# Patient Record
Sex: Female | Born: 1935 | Race: White
Health system: Southern US, Community
[De-identification: ages and names within clinical notes are randomized; demographics above are authoritative.]

## PROBLEM LIST (undated history)

## (undated) DIAGNOSIS — Z923 Personal history of irradiation: Secondary | ICD-10-CM

## (undated) DIAGNOSIS — T7840XA Allergy, unspecified, initial encounter: Secondary | ICD-10-CM

## (undated) DIAGNOSIS — R32 Unspecified urinary incontinence: Secondary | ICD-10-CM

## (undated) DIAGNOSIS — C801 Malignant (primary) neoplasm, unspecified: Secondary | ICD-10-CM

## (undated) DIAGNOSIS — R0600 Dyspnea, unspecified: Secondary | ICD-10-CM

## (undated) DIAGNOSIS — G629 Polyneuropathy, unspecified: Secondary | ICD-10-CM

## (undated) DIAGNOSIS — C50919 Malignant neoplasm of unspecified site of unspecified female breast: Secondary | ICD-10-CM

## (undated) DIAGNOSIS — H269 Unspecified cataract: Secondary | ICD-10-CM

## (undated) DIAGNOSIS — I10 Essential (primary) hypertension: Secondary | ICD-10-CM

## (undated) DIAGNOSIS — G4730 Sleep apnea, unspecified: Secondary | ICD-10-CM

## (undated) DIAGNOSIS — F419 Anxiety disorder, unspecified: Secondary | ICD-10-CM

## (undated) DIAGNOSIS — K635 Polyp of colon: Secondary | ICD-10-CM

## (undated) DIAGNOSIS — K219 Gastro-esophageal reflux disease without esophagitis: Secondary | ICD-10-CM

## (undated) DIAGNOSIS — M1990 Unspecified osteoarthritis, unspecified site: Secondary | ICD-10-CM

## (undated) DIAGNOSIS — R600 Localized edema: Secondary | ICD-10-CM

## (undated) HISTORY — DX: Unspecified osteoarthritis, unspecified site: M19.90

## (undated) HISTORY — DX: Malignant neoplasm of unspecified site of unspecified female breast: C50.919

## (undated) HISTORY — PX: ABDOMINAL HYSTERECTOMY: SHX81

## (undated) HISTORY — DX: Unspecified cataract: H26.9

## (undated) HISTORY — PX: TONSILLECTOMY: SUR1361

## (undated) HISTORY — DX: Essential (primary) hypertension: I10

## (undated) HISTORY — DX: Malignant (primary) neoplasm, unspecified: C80.1

## (undated) HISTORY — DX: Polyp of colon: K63.5

## (undated) HISTORY — DX: Gastro-esophageal reflux disease without esophagitis: K21.9

## (undated) HISTORY — PX: CARPAL TUNNEL RELEASE: SHX101

## (undated) HISTORY — DX: Allergy, unspecified, initial encounter: T78.40XA

---

## 1991-04-04 DIAGNOSIS — M1990 Unspecified osteoarthritis, unspecified site: Secondary | ICD-10-CM

## 1991-04-04 HISTORY — DX: Unspecified osteoarthritis, unspecified site: M19.90

## 2003-04-04 DIAGNOSIS — K635 Polyp of colon: Secondary | ICD-10-CM

## 2003-04-04 HISTORY — PX: EYE SURGERY: SHX253

## 2003-04-04 HISTORY — DX: Polyp of colon: K63.5

## 2003-05-05 HISTORY — PX: COLONOSCOPY: SHX174

## 2004-03-31 ENCOUNTER — Ambulatory Visit: Payer: Self-pay | Admitting: Specialist

## 2004-03-31 ENCOUNTER — Other Ambulatory Visit: Payer: Self-pay

## 2004-04-07 ENCOUNTER — Ambulatory Visit: Payer: Self-pay | Admitting: Specialist

## 2004-05-19 ENCOUNTER — Ambulatory Visit: Payer: Self-pay | Admitting: Specialist

## 2004-11-03 ENCOUNTER — Ambulatory Visit: Payer: Self-pay | Admitting: Family Medicine

## 2005-04-03 HISTORY — PX: JOINT REPLACEMENT: SHX530

## 2005-07-19 ENCOUNTER — Inpatient Hospital Stay (HOSPITAL_COMMUNITY): Admission: RE | Admit: 2005-07-19 | Discharge: 2005-07-24 | Payer: Self-pay | Admitting: Orthopedic Surgery

## 2005-07-20 ENCOUNTER — Ambulatory Visit: Payer: Self-pay | Admitting: Physical Medicine & Rehabilitation

## 2005-10-18 ENCOUNTER — Inpatient Hospital Stay (HOSPITAL_COMMUNITY): Admission: RE | Admit: 2005-10-18 | Discharge: 2005-10-23 | Payer: Self-pay | Admitting: Orthopedic Surgery

## 2005-12-21 ENCOUNTER — Ambulatory Visit: Payer: Self-pay | Admitting: Family Medicine

## 2007-01-31 ENCOUNTER — Ambulatory Visit: Payer: Self-pay | Admitting: Family Medicine

## 2008-02-04 ENCOUNTER — Ambulatory Visit: Payer: Self-pay | Admitting: Family Medicine

## 2008-06-17 ENCOUNTER — Ambulatory Visit: Payer: Self-pay | Admitting: Family Medicine

## 2008-07-08 ENCOUNTER — Ambulatory Visit: Payer: Self-pay | Admitting: Family Medicine

## 2009-03-24 ENCOUNTER — Ambulatory Visit: Payer: Self-pay | Admitting: Family Medicine

## 2010-04-03 DIAGNOSIS — Z923 Personal history of irradiation: Secondary | ICD-10-CM

## 2010-04-03 DIAGNOSIS — C801 Malignant (primary) neoplasm, unspecified: Secondary | ICD-10-CM

## 2010-04-03 HISTORY — DX: Personal history of irradiation: Z92.3

## 2010-04-03 HISTORY — PX: BREAST LUMPECTOMY: SHX2

## 2010-04-03 HISTORY — DX: Malignant (primary) neoplasm, unspecified: C80.1

## 2010-04-03 HISTORY — PX: BREAST BIOPSY: SHX20

## 2010-06-29 ENCOUNTER — Ambulatory Visit: Payer: Self-pay | Admitting: Otolaryngology

## 2010-07-13 ENCOUNTER — Ambulatory Visit: Payer: Self-pay | Admitting: Family Medicine

## 2010-07-15 ENCOUNTER — Ambulatory Visit: Payer: Self-pay | Admitting: Family Medicine

## 2010-07-29 ENCOUNTER — Ambulatory Visit: Payer: Self-pay | Admitting: General Surgery

## 2010-08-01 ENCOUNTER — Ambulatory Visit: Payer: Self-pay | Admitting: General Surgery

## 2010-08-02 HISTORY — PX: OTHER SURGICAL HISTORY: SHX169

## 2010-08-03 LAB — PATHOLOGY REPORT

## 2010-08-17 ENCOUNTER — Ambulatory Visit: Payer: Self-pay | Admitting: Internal Medicine

## 2010-09-02 ENCOUNTER — Ambulatory Visit: Payer: Self-pay | Admitting: Internal Medicine

## 2010-10-02 ENCOUNTER — Ambulatory Visit: Payer: Self-pay | Admitting: Internal Medicine

## 2010-11-02 ENCOUNTER — Ambulatory Visit: Payer: Self-pay | Admitting: Internal Medicine

## 2010-12-23 ENCOUNTER — Ambulatory Visit: Payer: Self-pay | Admitting: Internal Medicine

## 2011-01-09 ENCOUNTER — Ambulatory Visit: Payer: Self-pay | Admitting: Internal Medicine

## 2011-02-02 ENCOUNTER — Ambulatory Visit: Payer: Self-pay | Admitting: Internal Medicine

## 2011-03-04 ENCOUNTER — Ambulatory Visit: Payer: Self-pay | Admitting: Internal Medicine

## 2011-03-30 ENCOUNTER — Ambulatory Visit: Payer: Self-pay | Admitting: General Surgery

## 2011-04-06 DIAGNOSIS — Z853 Personal history of malignant neoplasm of breast: Secondary | ICD-10-CM | POA: Diagnosis not present

## 2011-05-12 ENCOUNTER — Ambulatory Visit: Payer: Self-pay | Admitting: Internal Medicine

## 2011-05-12 DIAGNOSIS — Z7982 Long term (current) use of aspirin: Secondary | ICD-10-CM | POA: Diagnosis not present

## 2011-05-12 DIAGNOSIS — Z170 Estrogen receptor positive status [ER+]: Secondary | ICD-10-CM | POA: Diagnosis not present

## 2011-05-12 DIAGNOSIS — I10 Essential (primary) hypertension: Secondary | ICD-10-CM | POA: Diagnosis not present

## 2011-05-12 DIAGNOSIS — Z79899 Other long term (current) drug therapy: Secondary | ICD-10-CM | POA: Diagnosis not present

## 2011-05-12 DIAGNOSIS — Z9889 Other specified postprocedural states: Secondary | ICD-10-CM | POA: Diagnosis not present

## 2011-05-12 DIAGNOSIS — Z923 Personal history of irradiation: Secondary | ICD-10-CM | POA: Diagnosis not present

## 2011-05-12 DIAGNOSIS — T8189XA Other complications of procedures, not elsewhere classified, initial encounter: Secondary | ICD-10-CM | POA: Diagnosis not present

## 2011-05-12 DIAGNOSIS — C50919 Malignant neoplasm of unspecified site of unspecified female breast: Secondary | ICD-10-CM | POA: Diagnosis not present

## 2011-05-17 DIAGNOSIS — R21 Rash and other nonspecific skin eruption: Secondary | ICD-10-CM | POA: Diagnosis not present

## 2011-05-17 DIAGNOSIS — L219 Seborrheic dermatitis, unspecified: Secondary | ICD-10-CM | POA: Diagnosis not present

## 2011-05-17 DIAGNOSIS — L819 Disorder of pigmentation, unspecified: Secondary | ICD-10-CM | POA: Diagnosis not present

## 2011-05-24 DIAGNOSIS — Z961 Presence of intraocular lens: Secondary | ICD-10-CM | POA: Diagnosis not present

## 2011-05-24 DIAGNOSIS — H2510 Age-related nuclear cataract, unspecified eye: Secondary | ICD-10-CM | POA: Diagnosis not present

## 2011-06-02 ENCOUNTER — Ambulatory Visit: Payer: Self-pay | Admitting: Internal Medicine

## 2011-06-02 DIAGNOSIS — Z09 Encounter for follow-up examination after completed treatment for conditions other than malignant neoplasm: Secondary | ICD-10-CM | POA: Diagnosis not present

## 2011-06-02 DIAGNOSIS — T8189XA Other complications of procedures, not elsewhere classified, initial encounter: Secondary | ICD-10-CM | POA: Diagnosis not present

## 2011-06-02 DIAGNOSIS — Z7982 Long term (current) use of aspirin: Secondary | ICD-10-CM | POA: Diagnosis not present

## 2011-06-02 DIAGNOSIS — Z170 Estrogen receptor positive status [ER+]: Secondary | ICD-10-CM | POA: Diagnosis not present

## 2011-06-02 DIAGNOSIS — Z79899 Other long term (current) drug therapy: Secondary | ICD-10-CM | POA: Diagnosis not present

## 2011-06-02 DIAGNOSIS — I10 Essential (primary) hypertension: Secondary | ICD-10-CM | POA: Diagnosis not present

## 2011-06-02 DIAGNOSIS — Z923 Personal history of irradiation: Secondary | ICD-10-CM | POA: Diagnosis not present

## 2011-06-02 DIAGNOSIS — Z9889 Other specified postprocedural states: Secondary | ICD-10-CM | POA: Diagnosis not present

## 2011-06-02 DIAGNOSIS — C50919 Malignant neoplasm of unspecified site of unspecified female breast: Secondary | ICD-10-CM | POA: Diagnosis not present

## 2011-06-14 DIAGNOSIS — J329 Chronic sinusitis, unspecified: Secondary | ICD-10-CM | POA: Diagnosis not present

## 2011-06-14 DIAGNOSIS — C50919 Malignant neoplasm of unspecified site of unspecified female breast: Secondary | ICD-10-CM | POA: Diagnosis not present

## 2011-06-14 DIAGNOSIS — R05 Cough: Secondary | ICD-10-CM | POA: Diagnosis not present

## 2011-06-14 DIAGNOSIS — M129 Arthropathy, unspecified: Secondary | ICD-10-CM | POA: Diagnosis not present

## 2011-06-19 DIAGNOSIS — C50919 Malignant neoplasm of unspecified site of unspecified female breast: Secondary | ICD-10-CM | POA: Diagnosis not present

## 2011-06-19 DIAGNOSIS — Z09 Encounter for follow-up examination after completed treatment for conditions other than malignant neoplasm: Secondary | ICD-10-CM | POA: Diagnosis not present

## 2011-06-19 DIAGNOSIS — T8189XA Other complications of procedures, not elsewhere classified, initial encounter: Secondary | ICD-10-CM | POA: Diagnosis not present

## 2011-06-19 DIAGNOSIS — Z170 Estrogen receptor positive status [ER+]: Secondary | ICD-10-CM | POA: Diagnosis not present

## 2011-07-03 ENCOUNTER — Ambulatory Visit: Payer: Self-pay | Admitting: Internal Medicine

## 2011-07-11 DIAGNOSIS — Z1331 Encounter for screening for depression: Secondary | ICD-10-CM | POA: Diagnosis not present

## 2011-07-11 DIAGNOSIS — Z23 Encounter for immunization: Secondary | ICD-10-CM | POA: Diagnosis not present

## 2011-07-11 DIAGNOSIS — M129 Arthropathy, unspecified: Secondary | ICD-10-CM | POA: Diagnosis not present

## 2011-07-11 DIAGNOSIS — Z0000 Encounter for general adult medical examination without abnormal findings: Secondary | ICD-10-CM | POA: Diagnosis not present

## 2011-07-11 DIAGNOSIS — R05 Cough: Secondary | ICD-10-CM | POA: Diagnosis not present

## 2011-07-11 DIAGNOSIS — C50919 Malignant neoplasm of unspecified site of unspecified female breast: Secondary | ICD-10-CM | POA: Diagnosis not present

## 2011-07-20 DIAGNOSIS — D485 Neoplasm of uncertain behavior of skin: Secondary | ICD-10-CM | POA: Diagnosis not present

## 2011-07-20 DIAGNOSIS — N951 Menopausal and female climacteric states: Secondary | ICD-10-CM | POA: Diagnosis not present

## 2011-07-20 DIAGNOSIS — L570 Actinic keratosis: Secondary | ICD-10-CM | POA: Diagnosis not present

## 2011-07-20 DIAGNOSIS — L719 Rosacea, unspecified: Secondary | ICD-10-CM | POA: Diagnosis not present

## 2011-07-20 DIAGNOSIS — E2839 Other primary ovarian failure: Secondary | ICD-10-CM | POA: Diagnosis not present

## 2011-07-20 DIAGNOSIS — M899 Disorder of bone, unspecified: Secondary | ICD-10-CM | POA: Diagnosis not present

## 2011-07-20 DIAGNOSIS — M949 Disorder of cartilage, unspecified: Secondary | ICD-10-CM | POA: Diagnosis not present

## 2011-08-24 DIAGNOSIS — L589 Radiodermatitis, unspecified: Secondary | ICD-10-CM | POA: Diagnosis not present

## 2011-08-24 DIAGNOSIS — L570 Actinic keratosis: Secondary | ICD-10-CM | POA: Diagnosis not present

## 2011-10-10 ENCOUNTER — Ambulatory Visit: Payer: Self-pay | Admitting: General Surgery

## 2011-10-10 DIAGNOSIS — R928 Other abnormal and inconclusive findings on diagnostic imaging of breast: Secondary | ICD-10-CM | POA: Diagnosis not present

## 2011-10-10 DIAGNOSIS — Z853 Personal history of malignant neoplasm of breast: Secondary | ICD-10-CM | POA: Diagnosis not present

## 2011-10-19 ENCOUNTER — Ambulatory Visit: Payer: Self-pay | Admitting: Internal Medicine

## 2011-10-19 DIAGNOSIS — C50919 Malignant neoplasm of unspecified site of unspecified female breast: Secondary | ICD-10-CM | POA: Diagnosis not present

## 2011-10-19 DIAGNOSIS — G4730 Sleep apnea, unspecified: Secondary | ICD-10-CM | POA: Diagnosis not present

## 2011-10-19 DIAGNOSIS — Z79811 Long term (current) use of aromatase inhibitors: Secondary | ICD-10-CM | POA: Diagnosis not present

## 2011-10-19 DIAGNOSIS — Z79899 Other long term (current) drug therapy: Secondary | ICD-10-CM | POA: Diagnosis not present

## 2011-10-19 DIAGNOSIS — I10 Essential (primary) hypertension: Secondary | ICD-10-CM | POA: Diagnosis not present

## 2011-10-19 DIAGNOSIS — Z170 Estrogen receptor positive status [ER+]: Secondary | ICD-10-CM | POA: Diagnosis not present

## 2011-10-26 DIAGNOSIS — Z853 Personal history of malignant neoplasm of breast: Secondary | ICD-10-CM | POA: Diagnosis not present

## 2011-11-02 ENCOUNTER — Ambulatory Visit: Payer: Self-pay | Admitting: Internal Medicine

## 2011-11-02 DIAGNOSIS — C50919 Malignant neoplasm of unspecified site of unspecified female breast: Secondary | ICD-10-CM | POA: Diagnosis not present

## 2011-11-02 DIAGNOSIS — Z923 Personal history of irradiation: Secondary | ICD-10-CM | POA: Diagnosis not present

## 2011-11-02 DIAGNOSIS — Z79899 Other long term (current) drug therapy: Secondary | ICD-10-CM | POA: Diagnosis not present

## 2011-11-02 DIAGNOSIS — Z79811 Long term (current) use of aromatase inhibitors: Secondary | ICD-10-CM | POA: Diagnosis not present

## 2011-11-02 DIAGNOSIS — Z170 Estrogen receptor positive status [ER+]: Secondary | ICD-10-CM | POA: Diagnosis not present

## 2011-11-02 DIAGNOSIS — Z09 Encounter for follow-up examination after completed treatment for conditions other than malignant neoplasm: Secondary | ICD-10-CM | POA: Diagnosis not present

## 2011-11-02 DIAGNOSIS — G4730 Sleep apnea, unspecified: Secondary | ICD-10-CM | POA: Diagnosis not present

## 2011-11-02 DIAGNOSIS — I10 Essential (primary) hypertension: Secondary | ICD-10-CM | POA: Diagnosis not present

## 2011-11-14 DIAGNOSIS — Z79811 Long term (current) use of aromatase inhibitors: Secondary | ICD-10-CM | POA: Diagnosis not present

## 2011-11-14 DIAGNOSIS — Z09 Encounter for follow-up examination after completed treatment for conditions other than malignant neoplasm: Secondary | ICD-10-CM | POA: Diagnosis not present

## 2011-11-14 DIAGNOSIS — C50919 Malignant neoplasm of unspecified site of unspecified female breast: Secondary | ICD-10-CM | POA: Diagnosis not present

## 2011-11-14 DIAGNOSIS — Z170 Estrogen receptor positive status [ER+]: Secondary | ICD-10-CM | POA: Diagnosis not present

## 2011-12-03 ENCOUNTER — Ambulatory Visit: Payer: Self-pay | Admitting: Internal Medicine

## 2011-12-03 DIAGNOSIS — I10 Essential (primary) hypertension: Secondary | ICD-10-CM | POA: Diagnosis not present

## 2011-12-03 DIAGNOSIS — Z09 Encounter for follow-up examination after completed treatment for conditions other than malignant neoplasm: Secondary | ICD-10-CM | POA: Diagnosis not present

## 2011-12-03 DIAGNOSIS — Z170 Estrogen receptor positive status [ER+]: Secondary | ICD-10-CM | POA: Diagnosis not present

## 2011-12-03 DIAGNOSIS — Z23 Encounter for immunization: Secondary | ICD-10-CM | POA: Diagnosis not present

## 2011-12-03 DIAGNOSIS — Z79811 Long term (current) use of aromatase inhibitors: Secondary | ICD-10-CM | POA: Diagnosis not present

## 2011-12-03 DIAGNOSIS — Z79899 Other long term (current) drug therapy: Secondary | ICD-10-CM | POA: Diagnosis not present

## 2011-12-03 DIAGNOSIS — G4730 Sleep apnea, unspecified: Secondary | ICD-10-CM | POA: Diagnosis not present

## 2011-12-03 DIAGNOSIS — C50919 Malignant neoplasm of unspecified site of unspecified female breast: Secondary | ICD-10-CM | POA: Diagnosis not present

## 2011-12-03 DIAGNOSIS — Z923 Personal history of irradiation: Secondary | ICD-10-CM | POA: Diagnosis not present

## 2012-01-02 ENCOUNTER — Ambulatory Visit: Payer: Self-pay | Admitting: Internal Medicine

## 2012-02-28 ENCOUNTER — Ambulatory Visit: Payer: Self-pay | Admitting: Internal Medicine

## 2012-02-28 DIAGNOSIS — Z90710 Acquired absence of both cervix and uterus: Secondary | ICD-10-CM | POA: Diagnosis not present

## 2012-02-28 DIAGNOSIS — I10 Essential (primary) hypertension: Secondary | ICD-10-CM | POA: Diagnosis not present

## 2012-02-28 DIAGNOSIS — Z170 Estrogen receptor positive status [ER+]: Secondary | ICD-10-CM | POA: Diagnosis not present

## 2012-02-28 DIAGNOSIS — C50919 Malignant neoplasm of unspecified site of unspecified female breast: Secondary | ICD-10-CM | POA: Diagnosis not present

## 2012-02-28 DIAGNOSIS — G4730 Sleep apnea, unspecified: Secondary | ICD-10-CM | POA: Diagnosis not present

## 2012-02-28 DIAGNOSIS — Z79811 Long term (current) use of aromatase inhibitors: Secondary | ICD-10-CM | POA: Diagnosis not present

## 2012-02-28 DIAGNOSIS — Z79899 Other long term (current) drug therapy: Secondary | ICD-10-CM | POA: Diagnosis not present

## 2012-02-28 DIAGNOSIS — Z9089 Acquired absence of other organs: Secondary | ICD-10-CM | POA: Diagnosis not present

## 2012-02-28 DIAGNOSIS — L408 Other psoriasis: Secondary | ICD-10-CM | POA: Diagnosis not present

## 2012-02-28 DIAGNOSIS — Z96659 Presence of unspecified artificial knee joint: Secondary | ICD-10-CM | POA: Diagnosis not present

## 2012-02-28 DIAGNOSIS — M129 Arthropathy, unspecified: Secondary | ICD-10-CM | POA: Diagnosis not present

## 2012-02-28 DIAGNOSIS — Z800 Family history of malignant neoplasm of digestive organs: Secondary | ICD-10-CM | POA: Diagnosis not present

## 2012-03-03 ENCOUNTER — Ambulatory Visit: Payer: Self-pay | Admitting: Internal Medicine

## 2012-03-07 DIAGNOSIS — R5383 Other fatigue: Secondary | ICD-10-CM | POA: Diagnosis not present

## 2012-03-07 DIAGNOSIS — M1990 Unspecified osteoarthritis, unspecified site: Secondary | ICD-10-CM | POA: Diagnosis not present

## 2012-03-07 DIAGNOSIS — I10 Essential (primary) hypertension: Secondary | ICD-10-CM | POA: Diagnosis not present

## 2012-03-07 DIAGNOSIS — J309 Allergic rhinitis, unspecified: Secondary | ICD-10-CM | POA: Diagnosis not present

## 2012-03-19 DIAGNOSIS — R5381 Other malaise: Secondary | ICD-10-CM | POA: Diagnosis not present

## 2012-03-19 DIAGNOSIS — I10 Essential (primary) hypertension: Secondary | ICD-10-CM | POA: Diagnosis not present

## 2012-03-19 DIAGNOSIS — G2581 Restless legs syndrome: Secondary | ICD-10-CM | POA: Diagnosis not present

## 2012-03-19 DIAGNOSIS — Z79899 Other long term (current) drug therapy: Secondary | ICD-10-CM | POA: Diagnosis not present

## 2012-04-11 ENCOUNTER — Ambulatory Visit: Payer: Self-pay | Admitting: General Surgery

## 2012-04-11 DIAGNOSIS — Z1231 Encounter for screening mammogram for malignant neoplasm of breast: Secondary | ICD-10-CM | POA: Diagnosis not present

## 2012-04-11 DIAGNOSIS — R928 Other abnormal and inconclusive findings on diagnostic imaging of breast: Secondary | ICD-10-CM | POA: Diagnosis not present

## 2012-04-11 DIAGNOSIS — Z853 Personal history of malignant neoplasm of breast: Secondary | ICD-10-CM | POA: Diagnosis not present

## 2012-04-25 DIAGNOSIS — M25549 Pain in joints of unspecified hand: Secondary | ICD-10-CM | POA: Diagnosis not present

## 2012-04-25 DIAGNOSIS — M659 Synovitis and tenosynovitis, unspecified: Secondary | ICD-10-CM | POA: Diagnosis not present

## 2012-04-25 DIAGNOSIS — M25519 Pain in unspecified shoulder: Secondary | ICD-10-CM | POA: Diagnosis not present

## 2012-04-25 DIAGNOSIS — M65849 Other synovitis and tenosynovitis, unspecified hand: Secondary | ICD-10-CM | POA: Diagnosis not present

## 2012-05-14 DIAGNOSIS — J40 Bronchitis, not specified as acute or chronic: Secondary | ICD-10-CM | POA: Diagnosis not present

## 2012-05-14 DIAGNOSIS — M159 Polyosteoarthritis, unspecified: Secondary | ICD-10-CM | POA: Diagnosis not present

## 2012-05-14 DIAGNOSIS — M67919 Unspecified disorder of synovium and tendon, unspecified shoulder: Secondary | ICD-10-CM | POA: Diagnosis not present

## 2012-06-26 ENCOUNTER — Ambulatory Visit: Payer: Self-pay | Admitting: Internal Medicine

## 2012-07-22 ENCOUNTER — Other Ambulatory Visit: Payer: Self-pay | Admitting: General Surgery

## 2012-07-22 DIAGNOSIS — Z1211 Encounter for screening for malignant neoplasm of colon: Secondary | ICD-10-CM

## 2012-07-23 ENCOUNTER — Encounter: Payer: Self-pay | Admitting: *Deleted

## 2012-07-24 ENCOUNTER — Ambulatory Visit: Payer: Self-pay | Admitting: General Surgery

## 2012-07-24 ENCOUNTER — Encounter: Payer: Self-pay | Admitting: General Surgery

## 2012-07-24 DIAGNOSIS — G4730 Sleep apnea, unspecified: Secondary | ICD-10-CM | POA: Diagnosis not present

## 2012-07-24 DIAGNOSIS — Z1211 Encounter for screening for malignant neoplasm of colon: Secondary | ICD-10-CM | POA: Diagnosis not present

## 2012-07-24 DIAGNOSIS — Z853 Personal history of malignant neoplasm of breast: Secondary | ICD-10-CM | POA: Diagnosis not present

## 2012-07-24 DIAGNOSIS — R0989 Other specified symptoms and signs involving the circulatory and respiratory systems: Secondary | ICD-10-CM | POA: Diagnosis not present

## 2012-07-24 DIAGNOSIS — E669 Obesity, unspecified: Secondary | ICD-10-CM | POA: Diagnosis not present

## 2012-07-24 DIAGNOSIS — I10 Essential (primary) hypertension: Secondary | ICD-10-CM | POA: Diagnosis not present

## 2012-07-24 DIAGNOSIS — Z86010 Personal history of colonic polyps: Secondary | ICD-10-CM | POA: Diagnosis not present

## 2012-07-24 DIAGNOSIS — Z79899 Other long term (current) drug therapy: Secondary | ICD-10-CM | POA: Diagnosis not present

## 2012-07-24 DIAGNOSIS — R062 Wheezing: Secondary | ICD-10-CM | POA: Diagnosis not present

## 2012-07-24 DIAGNOSIS — Z6841 Body Mass Index (BMI) 40.0 and over, adult: Secondary | ICD-10-CM | POA: Diagnosis not present

## 2012-07-24 DIAGNOSIS — D126 Benign neoplasm of colon, unspecified: Secondary | ICD-10-CM | POA: Diagnosis not present

## 2012-07-24 DIAGNOSIS — K5730 Diverticulosis of large intestine without perforation or abscess without bleeding: Secondary | ICD-10-CM | POA: Diagnosis not present

## 2012-07-24 DIAGNOSIS — Z800 Family history of malignant neoplasm of digestive organs: Secondary | ICD-10-CM | POA: Diagnosis not present

## 2012-07-24 HISTORY — PX: COLONOSCOPY: SHX174

## 2012-07-31 ENCOUNTER — Encounter: Payer: Self-pay | Admitting: General Surgery

## 2012-08-06 ENCOUNTER — Telehealth: Payer: Self-pay | Admitting: *Deleted

## 2012-08-06 NOTE — Telephone Encounter (Signed)
Message copied by Currie Paris on Tue Aug 06, 2012  8:40 AM ------      Message from: Kieth Brightly      Created: Tue Aug 06, 2012  7:32 AM       Please let pt pt know the pathology was normal-colon polyp ------

## 2012-08-06 NOTE — Telephone Encounter (Signed)
Notified patient as instructed, patient pleased. Discussed follow-up appointments, patient agrees  

## 2012-08-14 ENCOUNTER — Encounter: Payer: Self-pay | Admitting: General Surgery

## 2012-08-14 DIAGNOSIS — L570 Actinic keratosis: Secondary | ICD-10-CM | POA: Diagnosis not present

## 2012-08-14 DIAGNOSIS — L280 Lichen simplex chronicus: Secondary | ICD-10-CM | POA: Diagnosis not present

## 2012-08-14 DIAGNOSIS — L259 Unspecified contact dermatitis, unspecified cause: Secondary | ICD-10-CM | POA: Diagnosis not present

## 2012-08-21 DIAGNOSIS — H2510 Age-related nuclear cataract, unspecified eye: Secondary | ICD-10-CM | POA: Diagnosis not present

## 2012-08-21 DIAGNOSIS — Z961 Presence of intraocular lens: Secondary | ICD-10-CM | POA: Diagnosis not present

## 2012-09-05 DIAGNOSIS — R072 Precordial pain: Secondary | ICD-10-CM | POA: Diagnosis not present

## 2012-09-05 DIAGNOSIS — C50919 Malignant neoplasm of unspecified site of unspecified female breast: Secondary | ICD-10-CM | POA: Diagnosis not present

## 2012-09-05 DIAGNOSIS — I10 Essential (primary) hypertension: Secondary | ICD-10-CM | POA: Diagnosis not present

## 2012-09-05 DIAGNOSIS — M1990 Unspecified osteoarthritis, unspecified site: Secondary | ICD-10-CM | POA: Diagnosis not present

## 2012-10-10 ENCOUNTER — Ambulatory Visit: Payer: Self-pay | Admitting: General Surgery

## 2012-10-10 DIAGNOSIS — Z853 Personal history of malignant neoplasm of breast: Secondary | ICD-10-CM | POA: Diagnosis not present

## 2012-10-10 DIAGNOSIS — R928 Other abnormal and inconclusive findings on diagnostic imaging of breast: Secondary | ICD-10-CM | POA: Diagnosis not present

## 2012-10-23 ENCOUNTER — Encounter: Payer: Self-pay | Admitting: General Surgery

## 2012-10-23 ENCOUNTER — Ambulatory Visit (INDEPENDENT_AMBULATORY_CARE_PROVIDER_SITE_OTHER): Payer: Medicare Other | Admitting: General Surgery

## 2012-10-23 VITALS — BP 140/78 | HR 78 | Resp 12 | Ht 67.0 in | Wt 271.0 lb

## 2012-10-23 DIAGNOSIS — Z853 Personal history of malignant neoplasm of breast: Secondary | ICD-10-CM

## 2012-10-23 NOTE — Patient Instructions (Addendum)
Continue self breast checks. Patient to return in 1 year with bilateral diagnostic mammogram.

## 2012-10-23 NOTE — Progress Notes (Signed)
Patient ID: Lisa Paul, female   DOB: 04-Aug-1934, 78 y.o.   MRN: [id number]  Chief Complaint  Patient presents with  . Follow-up    mammogram    HPI Lisa Paul is a 78 y.o. female who presents for a breast evaluation. The most recent mammogram was done on 10/01/12 with a birad category 2. Patient does perform regular self breast checks and gets regular mammograms done.  The patient states no new breast problems at this time.   HPI  Past Medical History  Diagnosis Date  . Arthritis 1993  . Hypertension 15 years  . Colon polyp 2005  . Cancer 2012    Left Breast  . Malignant neoplasm of breast (female), unspecified site     left breast    Past Surgical History  Procedure Laterality Date  . Colonoscopy  05/2003    Dr Maryruth Bun [facility]  . Tonsillectomy  age 37  . Carpal tunnel release Bilateral   . Eye surgery Bilateral 2005    cataract  . Joint replacement Bilateral 2007    knees  . Abdominal hysterectomy  30 years ago  . Mammosite balloon placement Left 08/2010    Removal 09/2010  . Breast lumpectomy Left 2012  Stage 1 ca, T1,N0,M0. ER/PR pos, her 2 not amplified.  Family History  Problem Relation Age of Onset  . Colon cancer Mother     Social History History  Substance Use Topics  . Smoking status: Never Smoker   . Smokeless tobacco: Never Used  . Alcohol Use: No    Allergies  Allergen Reactions  . Tape     Paper tape - blisters    Current Outpatient Prescriptions  Medication Sig Dispense Refill  . anastrozole (ARIMIDEX) 1 MG tablet Take 1 tablet by mouth daily at 6 (six) AM.      . aspirin 81 MG tablet Take 81 mg by mouth daily.      . calcium-vitamin D 250-100 MG-UNIT per tablet Take 1 tablet by mouth 2 (two) times daily.      . Fluocinonide 0.1 % CREA Take 1 tablet by mouth daily.      Marland Kitchen losartan-hydrochlorothiazide (HYZAAR) 100-25 MG per tablet Take 1 tablet by mouth daily.      . meloxicam (MOBIC) 7.5 MG tablet Take 1 tablet by mouth daily.      .  ranitidine (ZANTAC) 150 MG tablet Take 1 tablet by mouth daily.       No current facility-administered medications for this visit.    Review of Systems Review of Systems  Constitutional: Negative.   Respiratory: Negative.   Cardiovascular: Negative.     Blood pressure 140/78, pulse 78, resp. rate 12, height 5\' 7"  (1.702 m), weight 271 lb (122.925 kg).  Physical Exam Physical Exam  Constitutional: She is oriented to person, place, and time. She appears well-developed and well-nourished.  Eyes: Conjunctivae are normal. No scleral icterus.  Neck: No tracheal deviation present. No thyromegaly present.  Cardiovascular: Normal rate, regular rhythm and normal heart sounds.   No murmur heard. Pulses:      Dorsalis pedis pulses are 2+ on the right side, and 2+ on the left side.       Posterior tibial pulses are 2+ on the right side, and 2+ on the left side.  No edema in legs, no vv.  Pulmonary/Chest: Effort normal and breath sounds normal. Right breast exhibits no inverted nipple, no mass, no nipple discharge, no skin change and no tenderness. Left  breast exhibits no inverted nipple, no mass, no nipple discharge, no skin change and no tenderness.    Abdominal: Soft. Bowel sounds are normal. She exhibits no distension and no mass. There is no tenderness.  Lymphadenopathy:    She has no cervical adenopathy.    She has no axillary adenopathy.  Neurological: She is alert and oriented to person, place, and time.  Skin: Skin is warm and dry.    Data Reviewed Mammogram is stable.   Assessment    Stable exam.     Plan    1 year follow up with bilateral diagnostic mammogram.        Kortny Lirette G 10/23/2012, 10:53 AM

## 2012-10-30 ENCOUNTER — Encounter: Payer: Self-pay | Admitting: General Surgery

## 2012-11-11 ENCOUNTER — Ambulatory Visit: Payer: Self-pay | Admitting: Radiation Oncology

## 2013-01-08 DIAGNOSIS — M1990 Unspecified osteoarthritis, unspecified site: Secondary | ICD-10-CM | POA: Diagnosis not present

## 2013-01-08 DIAGNOSIS — C50919 Malignant neoplasm of unspecified site of unspecified female breast: Secondary | ICD-10-CM | POA: Diagnosis not present

## 2013-01-08 DIAGNOSIS — Z23 Encounter for immunization: Secondary | ICD-10-CM | POA: Diagnosis not present

## 2013-01-08 DIAGNOSIS — I10 Essential (primary) hypertension: Secondary | ICD-10-CM | POA: Diagnosis not present

## 2013-01-08 DIAGNOSIS — R072 Precordial pain: Secondary | ICD-10-CM | POA: Diagnosis not present

## 2013-04-10 DIAGNOSIS — R5381 Other malaise: Secondary | ICD-10-CM | POA: Diagnosis not present

## 2013-04-10 DIAGNOSIS — E559 Vitamin D deficiency, unspecified: Secondary | ICD-10-CM | POA: Diagnosis not present

## 2013-04-10 DIAGNOSIS — E669 Obesity, unspecified: Secondary | ICD-10-CM | POA: Diagnosis not present

## 2013-04-10 DIAGNOSIS — J309 Allergic rhinitis, unspecified: Secondary | ICD-10-CM | POA: Diagnosis not present

## 2013-04-10 DIAGNOSIS — R5383 Other fatigue: Secondary | ICD-10-CM | POA: Diagnosis not present

## 2013-04-10 DIAGNOSIS — I10 Essential (primary) hypertension: Secondary | ICD-10-CM | POA: Diagnosis not present

## 2013-04-10 DIAGNOSIS — M129 Arthropathy, unspecified: Secondary | ICD-10-CM | POA: Diagnosis not present

## 2013-04-10 DIAGNOSIS — Z23 Encounter for immunization: Secondary | ICD-10-CM | POA: Diagnosis not present

## 2013-05-08 ENCOUNTER — Ambulatory Visit: Payer: Self-pay | Admitting: Internal Medicine

## 2013-05-08 DIAGNOSIS — I10 Essential (primary) hypertension: Secondary | ICD-10-CM | POA: Diagnosis not present

## 2013-05-08 DIAGNOSIS — G4730 Sleep apnea, unspecified: Secondary | ICD-10-CM | POA: Diagnosis not present

## 2013-05-08 DIAGNOSIS — Z170 Estrogen receptor positive status [ER+]: Secondary | ICD-10-CM | POA: Diagnosis not present

## 2013-05-08 DIAGNOSIS — H269 Unspecified cataract: Secondary | ICD-10-CM | POA: Diagnosis not present

## 2013-05-08 DIAGNOSIS — L408 Other psoriasis: Secondary | ICD-10-CM | POA: Diagnosis not present

## 2013-05-08 DIAGNOSIS — Z79899 Other long term (current) drug therapy: Secondary | ICD-10-CM | POA: Diagnosis not present

## 2013-05-08 DIAGNOSIS — C50919 Malignant neoplasm of unspecified site of unspecified female breast: Secondary | ICD-10-CM | POA: Diagnosis not present

## 2013-05-08 DIAGNOSIS — Z79811 Long term (current) use of aromatase inhibitors: Secondary | ICD-10-CM | POA: Diagnosis not present

## 2013-06-01 ENCOUNTER — Ambulatory Visit: Payer: Self-pay | Admitting: Internal Medicine

## 2013-08-20 DIAGNOSIS — R21 Rash and other nonspecific skin eruption: Secondary | ICD-10-CM | POA: Diagnosis not present

## 2013-08-20 DIAGNOSIS — L259 Unspecified contact dermatitis, unspecified cause: Secondary | ICD-10-CM | POA: Diagnosis not present

## 2013-08-20 DIAGNOSIS — L905 Scar conditions and fibrosis of skin: Secondary | ICD-10-CM | POA: Diagnosis not present

## 2013-08-20 DIAGNOSIS — L219 Seborrheic dermatitis, unspecified: Secondary | ICD-10-CM | POA: Diagnosis not present

## 2013-08-20 DIAGNOSIS — L280 Lichen simplex chronicus: Secondary | ICD-10-CM | POA: Diagnosis not present

## 2013-09-11 DIAGNOSIS — H53009 Unspecified amblyopia, unspecified eye: Secondary | ICD-10-CM | POA: Diagnosis not present

## 2013-10-07 ENCOUNTER — Encounter: Payer: Self-pay | Admitting: General Surgery

## 2013-10-28 ENCOUNTER — Encounter: Payer: Self-pay | Admitting: General Surgery

## 2013-10-28 DIAGNOSIS — Z853 Personal history of malignant neoplasm of breast: Secondary | ICD-10-CM | POA: Diagnosis not present

## 2013-10-28 DIAGNOSIS — R937 Abnormal findings on diagnostic imaging of other parts of musculoskeletal system: Secondary | ICD-10-CM | POA: Diagnosis not present

## 2013-11-06 ENCOUNTER — Encounter: Payer: Self-pay | Admitting: General Surgery

## 2013-11-06 ENCOUNTER — Ambulatory Visit (INDEPENDENT_AMBULATORY_CARE_PROVIDER_SITE_OTHER): Payer: Medicare Other | Admitting: General Surgery

## 2013-11-06 VITALS — BP 132/62 | HR 70 | Resp 14 | Ht 67.0 in | Wt 280.0 lb

## 2013-11-06 DIAGNOSIS — C50919 Malignant neoplasm of unspecified site of unspecified female breast: Secondary | ICD-10-CM | POA: Diagnosis not present

## 2013-11-06 DIAGNOSIS — C50912 Malignant neoplasm of unspecified site of left female breast: Secondary | ICD-10-CM

## 2013-11-06 NOTE — Progress Notes (Signed)
Patient ID: Lisa Paul, female   DOB: 07/10/1934, 79 y.o.   MRN: [id number]  Chief Complaint  Patient presents with  . Follow-up    mammogram    HPI Lisa Paul is a 79 y.o. female. who presents for a breast evaluation. The most recent mammogram was done on 10/28/13 at [facility].  Patient does perform regular self breast checks and gets regular mammograms done.  No new breast complaints.  She is 3 years post left breast lumpectomy and SN biopsy followed by radiation. Currently on Arimidex and doing well.     HPI  Past Medical History  Diagnosis Date  . Arthritis 1993  . Hypertension 15 years  . Colon polyp 2005  . Cancer 2012    Left Breast  . Malignant neoplasm of breast (female), unspecified site     left breast    Past Surgical History  Procedure Laterality Date  . Colonoscopy  05/2003    Dr Nicolasa Ducking [facility]  . Tonsillectomy  age 54  . Carpal tunnel release Bilateral   . Eye surgery Bilateral 2005    cataract  . Joint replacement Bilateral 2007    knees  . Abdominal hysterectomy  30 years ago  . Mammosite balloon placement Left 08/2010    Removal 09/2010  . Breast lumpectomy Left 2012    Family History  Problem Relation Age of Onset  . Colon cancer Mother     Social History History  Substance Use Topics  . Smoking status: Never Smoker   . Smokeless tobacco: Never Used  . Alcohol Use: No    Allergies  Allergen Reactions  . Tape     Paper tape - blisters    Current Outpatient Prescriptions  Medication Sig Dispense Refill  . anastrozole (ARIMIDEX) 1 MG tablet Take 1 tablet by mouth daily at 6 (six) AM.      . aspirin 81 MG tablet Take 81 mg by mouth daily.      . calcium-vitamin D 250-100 MG-UNIT per tablet Take 1 tablet by mouth 2 (two) times daily.      . Fluocinonide 0.1 % CREA Take 1 tablet by mouth daily.      Marland Kitchen losartan-hydrochlorothiazide (HYZAAR) 100-25 MG per tablet Take 1 tablet by mouth daily.      . meloxicam (MOBIC) 7.5 MG tablet Take 1  tablet by mouth daily.      . ranitidine (ZANTAC) 150 MG tablet Take 1 tablet by mouth daily.       No current facility-administered medications for this visit.    Review of Systems Review of Systems  Constitutional: Negative.   Respiratory: Negative.   Cardiovascular: Negative.     Blood pressure 132/62, pulse 70, resp. rate 14, height 5\' 7"  (1.702 m), weight 280 lb (127.007 kg).  Physical Exam Physical Exam  Constitutional: She is oriented to person, place, and time. She appears well-developed and well-nourished.  Eyes: Conjunctivae are normal. No scleral icterus.  Neck: Neck supple.  Cardiovascular: Normal rate, regular rhythm and normal heart sounds.   Pulmonary/Chest: Effort normal. Right breast exhibits no inverted nipple, no mass, no nipple discharge, no skin change and no tenderness. Left breast exhibits no inverted nipple, no mass, no nipple discharge, no skin change and no tenderness.  Abdominal: Normal appearance. There is no hepatosplenomegaly. There is no tenderness.  Lymphadenopathy:    She has no cervical adenopathy.    She has no axillary adenopathy.  Neurological: She is alert and oriented to person, place,  and time.  Skin: Skin is warm and dry.    Data Reviewed Mammogram reviewed and shows post lumpectomy radiation changes with coarse calcifications.BIRADS 3  Assessment    Stable physical exam. 3 years post left breast lumpectomy and SN biopsy followed by radiation. Currently on Arimidex and doing well.     Plan    Follow up in 6 months with left diagnostic mammogram and office visit.        Lisa Paul G 11/07/2013, 9:30 AM

## 2013-11-06 NOTE — Patient Instructions (Signed)
Follow up in 6 months with left mammogram and office visit Continue self breast exams. Call office for any new breast issues or concerns.

## 2013-11-07 ENCOUNTER — Encounter: Payer: Self-pay | Admitting: General Surgery

## 2014-01-15 ENCOUNTER — Ambulatory Visit: Payer: Self-pay | Admitting: Internal Medicine

## 2014-02-02 ENCOUNTER — Encounter: Payer: Self-pay | Admitting: General Surgery

## 2014-05-07 DIAGNOSIS — Z0000 Encounter for general adult medical examination without abnormal findings: Secondary | ICD-10-CM | POA: Diagnosis not present

## 2014-05-07 DIAGNOSIS — Z1389 Encounter for screening for other disorder: Secondary | ICD-10-CM | POA: Diagnosis not present

## 2014-05-07 DIAGNOSIS — Z23 Encounter for immunization: Secondary | ICD-10-CM | POA: Diagnosis not present

## 2014-05-19 DIAGNOSIS — N6489 Other specified disorders of breast: Secondary | ICD-10-CM | POA: Diagnosis not present

## 2014-05-19 DIAGNOSIS — R921 Mammographic calcification found on diagnostic imaging of breast: Secondary | ICD-10-CM | POA: Diagnosis not present

## 2014-05-19 DIAGNOSIS — C50412 Malignant neoplasm of upper-outer quadrant of left female breast: Secondary | ICD-10-CM | POA: Diagnosis not present

## 2014-05-20 ENCOUNTER — Encounter: Payer: Self-pay | Admitting: General Surgery

## 2014-05-27 ENCOUNTER — Ambulatory Visit (INDEPENDENT_AMBULATORY_CARE_PROVIDER_SITE_OTHER): Payer: Medicare Other | Admitting: General Surgery

## 2014-05-27 ENCOUNTER — Encounter: Payer: Self-pay | Admitting: General Surgery

## 2014-05-27 VITALS — BP 140/82 | HR 78 | Resp 12 | Ht 67.0 in | Wt 274.0 lb

## 2014-05-27 DIAGNOSIS — Z853 Personal history of malignant neoplasm of breast: Secondary | ICD-10-CM

## 2014-05-27 NOTE — Progress Notes (Signed)
Patient ID: Lisa Paul, female   DOB: 07-28-1934, 80 y.o.   MRN: [id number]  Chief Complaint  Patient presents with  . Follow-up    HPI Lisa Paul is a 80 y.o. female.  who presents for her follow up left mammogram and breast cancer followup. The most recent mammogram was done on 05-15-14.  Patient does perform regular self breast checks and gets regular mammograms done.  No new breast complaints. Next appointment with Dr. Cynda Acres is in March.  HPI  Past Medical History  Diagnosis Date  . Arthritis 1993  . Hypertension 15 years  . Colon polyp 2005  . Cancer 2012    Left Breast  . Malignant neoplasm of breast (female), unspecified site     left breast    Past Surgical History  Procedure Laterality Date  . Colonoscopy  05/2003    Dr Nicolasa Ducking [facility]  . Tonsillectomy  age 39  . Carpal tunnel release Bilateral   . Eye surgery Bilateral 2005    cataract  . Joint replacement Bilateral 2007    knees  . Abdominal hysterectomy  30 years ago  . Mammosite balloon placement Left 08/2010    Removal 09/2010  . Breast lumpectomy Left 2012    Family History  Problem Relation Age of Onset  . Colon cancer Mother     Social History History  Substance Use Topics  . Smoking status: Never Smoker   . Smokeless tobacco: Never Used  . Alcohol Use: No    Allergies  Allergen Reactions  . Tape     Paper tape - blisters    Current Outpatient Prescriptions  Medication Sig Dispense Refill  . anastrozole (ARIMIDEX) 1 MG tablet Take 1 tablet by mouth daily at 6 (six) AM.    . aspirin 81 MG tablet Take 81 mg by mouth daily.    . calcium-vitamin D 250-100 MG-UNIT per tablet Take 1 tablet by mouth 2 (two) times daily.    . Fluocinonide 0.1 % CREA Take 1 tablet by mouth daily.    . fluticasone (FLONASE) 50 MCG/ACT nasal spray   2  . losartan-hydrochlorothiazide (HYZAAR) 100-25 MG per tablet Take 1 tablet by mouth daily.    . meloxicam (MOBIC) 7.5 MG tablet Take 1 tablet by mouth daily.     . ranitidine (ZANTAC) 150 MG tablet Take 1 tablet by mouth daily.     No current facility-administered medications for this visit.    Review of Systems Review of Systems  Constitutional: Negative.   Respiratory: Negative.   Cardiovascular: Negative.     Blood pressure 140/82, pulse 78, resp. rate 12, height 5\' 7"  (1.702 m), weight 274 lb (124.286 kg).  Physical Exam Physical Exam  Constitutional: She is oriented to person, place, and time. She appears well-developed and well-nourished.  Eyes: Conjunctivae are normal. No scleral icterus.  Neck: Neck supple.  Cardiovascular: Normal rate, regular rhythm and normal heart sounds.   Pulmonary/Chest: Effort normal and breath sounds normal. Right breast exhibits no inverted nipple, no mass, no nipple discharge, no skin change and no tenderness. Left breast exhibits no inverted nipple, no mass, no nipple discharge, no skin change and no tenderness.  Moderate amount scarring lumpectomy site, upper outer quadrant left breast unchanged from before.  Abdominal: Soft. Normal appearance. There is no tenderness.  Lymphadenopathy:    She has no cervical adenopathy.    She has no axillary adenopathy.  Neurological: She is alert and oriented to person, place, and time.  Skin: Skin is warm and dry.    Data Reviewed Mammogram reviewed and stable.  Assessment    Stable physical exam. Pt 3 and 1/2 yrs out from left breast CA treatment-lumpcetomy, sn biopsy and mammosite. Pt is on Anastrazole.     Plan    The patient has been asked to return to the office in 6 months with a bilateral diagnostic mammogram. Continue self breast exams. Call office for any new breast issues or concerns.       SANKAR,SEEPLAPUTHUR G 05/27/2014, 6:00 PM

## 2014-05-27 NOTE — Patient Instructions (Signed)
The patient is aware to call back for any questions or concerns. Continue self breast exams. Call office for any new breast issues or concerns. 

## 2014-06-04 ENCOUNTER — Ambulatory Visit
Admit: 2014-06-04 | Disposition: A | Discharge status: Home / Self Care | Payer: Self-pay | Attending: Internal Medicine | Admitting: Internal Medicine

## 2014-06-04 DIAGNOSIS — C50912 Malignant neoplasm of unspecified site of left female breast: Secondary | ICD-10-CM | POA: Diagnosis not present

## 2014-06-04 DIAGNOSIS — G4730 Sleep apnea, unspecified: Secondary | ICD-10-CM | POA: Diagnosis not present

## 2014-06-04 DIAGNOSIS — I10 Essential (primary) hypertension: Secondary | ICD-10-CM | POA: Diagnosis not present

## 2014-06-04 DIAGNOSIS — Z79899 Other long term (current) drug therapy: Secondary | ICD-10-CM | POA: Diagnosis not present

## 2014-06-04 DIAGNOSIS — M1990 Unspecified osteoarthritis, unspecified site: Secondary | ICD-10-CM | POA: Diagnosis not present

## 2014-06-04 DIAGNOSIS — Z170 Estrogen receptor positive status [ER+]: Secondary | ICD-10-CM | POA: Diagnosis not present

## 2014-06-04 DIAGNOSIS — Z79811 Long term (current) use of aromatase inhibitors: Secondary | ICD-10-CM | POA: Diagnosis not present

## 2014-07-01 DIAGNOSIS — I10 Essential (primary) hypertension: Secondary | ICD-10-CM | POA: Diagnosis not present

## 2014-07-01 DIAGNOSIS — M1990 Unspecified osteoarthritis, unspecified site: Secondary | ICD-10-CM | POA: Diagnosis not present

## 2014-07-01 DIAGNOSIS — Z170 Estrogen receptor positive status [ER+]: Secondary | ICD-10-CM | POA: Diagnosis not present

## 2014-07-01 DIAGNOSIS — Z79811 Long term (current) use of aromatase inhibitors: Secondary | ICD-10-CM | POA: Diagnosis not present

## 2014-07-01 DIAGNOSIS — Z13820 Encounter for screening for osteoporosis: Secondary | ICD-10-CM | POA: Diagnosis not present

## 2014-07-01 DIAGNOSIS — Z79899 Other long term (current) drug therapy: Secondary | ICD-10-CM | POA: Diagnosis not present

## 2014-07-01 DIAGNOSIS — C50912 Malignant neoplasm of unspecified site of left female breast: Secondary | ICD-10-CM | POA: Diagnosis not present

## 2014-07-03 ENCOUNTER — Ambulatory Visit
Admit: 2014-07-03 | Disposition: A | Discharge status: Home / Self Care | Payer: Self-pay | Attending: Internal Medicine | Admitting: Internal Medicine

## 2014-08-13 DIAGNOSIS — Z23 Encounter for immunization: Secondary | ICD-10-CM | POA: Diagnosis not present

## 2014-08-13 DIAGNOSIS — J399 Disease of upper respiratory tract, unspecified: Secondary | ICD-10-CM | POA: Diagnosis not present

## 2014-08-13 DIAGNOSIS — Z1389 Encounter for screening for other disorder: Secondary | ICD-10-CM | POA: Diagnosis not present

## 2014-08-26 DIAGNOSIS — Z1283 Encounter for screening for malignant neoplasm of skin: Secondary | ICD-10-CM | POA: Diagnosis not present

## 2014-08-26 DIAGNOSIS — L720 Epidermal cyst: Secondary | ICD-10-CM | POA: Diagnosis not present

## 2014-08-26 DIAGNOSIS — L905 Scar conditions and fibrosis of skin: Secondary | ICD-10-CM | POA: Diagnosis not present

## 2014-08-26 DIAGNOSIS — D1800 Hemangioma unspecified site: Secondary | ICD-10-CM | POA: Diagnosis not present

## 2014-08-26 DIAGNOSIS — L309 Dermatitis, unspecified: Secondary | ICD-10-CM | POA: Diagnosis not present

## 2014-09-04 ENCOUNTER — Telehealth: Payer: Self-pay | Admitting: Family Medicine

## 2014-09-04 NOTE — Telephone Encounter (Signed)
Pt stated that she still hasn't gotten over her cough and would really like to come in today to see Dr. Rosanna Randy. Pt doesn't want to see PA. Pt would like to be worked in today or have Dr. Rosanna Randy call her back. Thanks TNP

## 2014-09-04 NOTE — Telephone Encounter (Signed)
Patient advised, patient was also advised that she has RX for Cough syrup at the front desk from may 20th in [place]. Patient will pick it up-aa

## 2014-09-04 NOTE — Telephone Encounter (Signed)
Have her to try delsym or Robitussin until she sees Dr. Rosanna Randy for further evaluation.  Thanks! -JB

## 2014-09-04 NOTE — Telephone Encounter (Signed)
Spoke with patient and she made appointment to see Dr. Rosanna Randy next week. Today everyone is booked up now. She waned to see if she could get some cough medication to help her, she coughs all the time and some that she had left helped her at night. Phlegm is thick but no real color to it. No fever or chills. Please review and let patient know. She wanted me to send this to you for review please. Thank you=-aa

## 2014-09-10 ENCOUNTER — Ambulatory Visit (INDEPENDENT_AMBULATORY_CARE_PROVIDER_SITE_OTHER): Payer: Medicare Other | Admitting: Family Medicine

## 2014-09-10 ENCOUNTER — Ambulatory Visit: Payer: Self-pay | Admitting: Family Medicine

## 2014-09-10 ENCOUNTER — Encounter: Payer: Self-pay | Admitting: Family Medicine

## 2014-09-10 VITALS — BP 128/80 | HR 88 | Temp 97.9°F | Resp 16 | Wt 280.0 lb

## 2014-09-10 DIAGNOSIS — J329 Chronic sinusitis, unspecified: Secondary | ICD-10-CM | POA: Insufficient documentation

## 2014-09-10 DIAGNOSIS — C50919 Malignant neoplasm of unspecified site of unspecified female breast: Secondary | ICD-10-CM | POA: Insufficient documentation

## 2014-09-10 DIAGNOSIS — K219 Gastro-esophageal reflux disease without esophagitis: Secondary | ICD-10-CM | POA: Insufficient documentation

## 2014-09-10 DIAGNOSIS — E559 Vitamin D deficiency, unspecified: Secondary | ICD-10-CM | POA: Insufficient documentation

## 2014-09-10 DIAGNOSIS — M179 Osteoarthritis of knee, unspecified: Secondary | ICD-10-CM | POA: Insufficient documentation

## 2014-09-10 DIAGNOSIS — G2581 Restless legs syndrome: Secondary | ICD-10-CM | POA: Insufficient documentation

## 2014-09-10 DIAGNOSIS — J309 Allergic rhinitis, unspecified: Secondary | ICD-10-CM | POA: Insufficient documentation

## 2014-09-10 DIAGNOSIS — M1710 Unilateral primary osteoarthritis, unspecified knee: Secondary | ICD-10-CM | POA: Insufficient documentation

## 2014-09-10 DIAGNOSIS — Z79890 Hormone replacement therapy (postmenopausal): Secondary | ICD-10-CM | POA: Insufficient documentation

## 2014-09-10 DIAGNOSIS — M1990 Unspecified osteoarthritis, unspecified site: Secondary | ICD-10-CM | POA: Insufficient documentation

## 2014-09-10 DIAGNOSIS — J069 Acute upper respiratory infection, unspecified: Secondary | ICD-10-CM | POA: Diagnosis not present

## 2014-09-10 DIAGNOSIS — I10 Essential (primary) hypertension: Secondary | ICD-10-CM | POA: Insufficient documentation

## 2014-09-10 DIAGNOSIS — Z8669 Personal history of other diseases of the nervous system and sense organs: Secondary | ICD-10-CM | POA: Insufficient documentation

## 2014-09-10 DIAGNOSIS — H9190 Unspecified hearing loss, unspecified ear: Secondary | ICD-10-CM | POA: Insufficient documentation

## 2014-09-10 NOTE — Progress Notes (Signed)
Lisa Paul  MRN: [id number] DOB: 15-Apr-1934  Subjective:  HPI   Pt was seen by Tawanna Sat on 08/13/14 for a URI. She was given a Zpak and cough syrup. Pt reports that she is feeling much better. She is still coughing a little but her sputum is clear as it was green prior to the antibiotic. She reports that she did not think she needed to come in today but her husband wanted her to come to make sure her chest was clear since she was still coughing some. She has had no fever or dyspnea  Patient Active Problem List   Diagnosis Date Noted  . Allergic rhinitis 09/10/2014  . Arthritis 09/10/2014  . Breast cancer 09/10/2014  . Chronic infection of sinus 09/10/2014  . Acid reflux 09/10/2014  . Auditory impairment 09/10/2014  . BP (high blood pressure) 09/10/2014  . Extreme obesity 09/10/2014  . Arthritis of knee, degenerative 09/10/2014  . Arthritis, degenerative 09/10/2014  . Need for prophylactic hormone replacement therapy (postmenopausal) 09/10/2014  . Restless legs syndrome 09/10/2014  . Apnea, sleep 09/10/2014  . Avitaminosis D 09/10/2014  . History of breast cancer 10/23/2012    Past Medical History  Diagnosis Date  . Arthritis 1993  . Hypertension 15 years  . Colon polyp 2005  . Cancer 2012    Left Breast  . Malignant neoplasm of breast (female), unspecified site     left breast    History   Social History  . Marital Status: Married    Spouse Name: N/A  . Number of Children: N/A  . Years of Education: N/A   Occupational History  . Not on file.   Social History Main Topics  . Smoking status: Never Smoker   . Smokeless tobacco: Never Used  . Alcohol Use: No  . Drug Use: No  . Sexual Activity: Not on file   Other Topics Concern  . Not on file   Social History Narrative    Outpatient Prescriptions Prior to Visit  Medication Sig Dispense Refill  . anastrozole (ARIMIDEX) 1 MG tablet Take 1 tablet by mouth daily at 6 (six) AM.    . aspirin 81 MG tablet Take  81 mg by mouth daily.    . calcium-vitamin D 250-100 MG-UNIT per tablet Take 1 tablet by mouth 2 (two) times daily.    . Fluocinonide 0.1 % CREA Take 1 tablet by mouth daily.    . ranitidine (ZANTAC) 150 MG tablet Take 1 tablet by mouth daily.    . fluticasone (FLONASE) 50 MCG/ACT nasal spray   2  . losartan-hydrochlorothiazide (HYZAAR) 100-25 MG per tablet Take 1 tablet by mouth daily.    . meloxicam (MOBIC) 7.5 MG tablet Take 1 tablet by mouth daily.     No facility-administered medications prior to visit.    Allergies  Allergen Reactions  . Tape     Paper tape - blisters    Review of Systems  Constitutional: Positive for malaise/fatigue.  HENT: Negative.   Eyes: Positive for discharge (watery eyes).  Respiratory: Positive for cough and sputum production (clear).   Cardiovascular: Negative.   Gastrointestinal: Negative.   Genitourinary: Negative.   Musculoskeletal: Negative.   Skin: Negative.   Neurological: Negative.   Endo/Heme/Allergies: Negative.   Psychiatric/Behavioral: Negative.    Objective:  BP 128/80 mmHg  Pulse 88  Temp(Src) 97.9 F (36.6 C) (Oral)  Resp 16  Wt 280 lb (127.007 kg)  SpO2 98%  Physical Exam  Constitutional: She is oriented  to person, place, and time and well-developed, well-nourished, and in no distress.  HENT:  Head: Normocephalic and atraumatic.  Right Ear: Hearing, tympanic membrane, external ear and ear canal normal.  Left Ear: Hearing, tympanic membrane, external ear and ear canal normal.  Nose: Nose normal.  Mouth/Throat: Oropharynx is clear and moist.  Eyes: Conjunctivae and EOM are normal. Pupils are equal, round, and reactive to light.  Neck: Normal range of motion. Neck supple.  Cardiovascular: Normal rate, regular rhythm, normal heart sounds and intact distal pulses.   Pulmonary/Chest: Effort normal and breath sounds normal.  Neurological: She is alert and oriented to person, place, and time. She has normal reflexes. Gait  normal. GCS score is 15.  Psychiatric: Mood, memory, affect and judgment normal.    Assessment and Plan :  Upper respiratory infection  1. Upper respiratory infection This is resolving nicely. Continue Robitussin-DM and fluids.  2. Allergic rhinitis, unspecified allergic rhinitis type Instructed to take Claritin and call if not improving. 3. Obesity Patient was seen and examined by Dr. Miguel Aschoff, and noted scribed by Webb Laws, [place] MD [facility] 09/10/2014 10:01 AM

## 2014-09-10 NOTE — Patient Instructions (Signed)
Pt to try over the counter Claritin (loratadine) for allergies. Call if not continuing to improve.

## 2014-09-30 ENCOUNTER — Other Ambulatory Visit: Payer: Self-pay

## 2014-09-30 DIAGNOSIS — C50412 Malignant neoplasm of upper-outer quadrant of left female breast: Secondary | ICD-10-CM

## 2014-10-22 ENCOUNTER — Telehealth: Payer: Self-pay | Admitting: *Deleted

## 2014-10-22 MED ORDER — ANASTROZOLE 1 MG PO TABS: 1.0000 mg | ORAL_TABLET | Freq: Every day | ORAL | Status: DC

## 2014-10-22 NOTE — Telephone Encounter (Signed)
Escribed

## 2014-10-23 DIAGNOSIS — Z961 Presence of intraocular lens: Secondary | ICD-10-CM | POA: Diagnosis not present

## 2014-11-09 ENCOUNTER — Encounter: Payer: Self-pay | Admitting: Family Medicine

## 2014-11-09 ENCOUNTER — Ambulatory Visit (INDEPENDENT_AMBULATORY_CARE_PROVIDER_SITE_OTHER): Payer: Medicare Other | Admitting: Family Medicine

## 2014-11-09 VITALS — BP 128/82 | HR 84 | Temp 97.8°F | Resp 16 | Wt 274.0 lb

## 2014-11-09 DIAGNOSIS — I10 Essential (primary) hypertension: Secondary | ICD-10-CM

## 2014-11-09 DIAGNOSIS — M1990 Unspecified osteoarthritis, unspecified site: Secondary | ICD-10-CM

## 2014-11-09 DIAGNOSIS — E785 Hyperlipidemia, unspecified: Secondary | ICD-10-CM

## 2014-11-09 DIAGNOSIS — J3089 Other allergic rhinitis: Secondary | ICD-10-CM | POA: Diagnosis not present

## 2014-11-09 DIAGNOSIS — M5431 Sciatica, right side: Secondary | ICD-10-CM | POA: Diagnosis not present

## 2014-11-09 DIAGNOSIS — K219 Gastro-esophageal reflux disease without esophagitis: Secondary | ICD-10-CM

## 2014-11-09 DIAGNOSIS — E668 Other obesity: Secondary | ICD-10-CM

## 2014-11-09 MED ORDER — MELOXICAM 7.5 MG PO TABS: 7.5000 mg | ORAL_TABLET | Freq: Every day | ORAL | Status: DC

## 2014-11-09 MED ORDER — RANITIDINE HCL 150 MG PO TABS: 150.0000 mg | ORAL_TABLET | Freq: Two times a day (BID) | ORAL | Status: DC

## 2014-11-09 NOTE — Progress Notes (Signed)
Patient ID: Lisa Paul, female   DOB: 10/04/1934, 80 y.o.   MRN: [id number]    Subjective:  HPI   Hypertension follow up:  Patient is here for 6 months follow up. She does not check her B/P outside of the office. She sometimes gets SOB with walking certain distance. No chest pain or chest tightness.  GERD follow up:  Patient takes Zantac 150 mg once daily and about 3 times a week she has to take twice daily and would like to get this changed if possible.  Allergic Rhinits follow up:  Patient takes OTC allergy medication. Symptoms are stable at this time.  Prior to Admission medications   Medication Sig Start Date End Date Taking? Authorizing Provider  anastrozole (ARIMIDEX) 1 MG tablet Take 1 tablet (1 mg total) by mouth daily. 10/22/14  Yes Evlyn Kanner, NP  aspirin 81 MG tablet Take 81 mg by mouth daily.   Yes Historical Provider, MD  calcium-vitamin D 250-100 MG-UNIT per tablet Take 1 tablet by mouth 2 (two) times daily.   Yes Historical Provider, MD  cholecalciferol (VITAMIN D) 1000 UNITS tablet Take by mouth. 05/07/14  Yes Historical Provider, MD  fexofenadine (ALLEGRA ALLERGY) 180 MG tablet Take by mouth. 12/06/10  Yes Historical Provider, MD  Fluocinonide 0.1 % CREA Take 1 tablet by mouth daily. 08/14/12  Yes Historical Provider, MD  fluticasone (FLONASE) 50 MCG/ACT nasal spray Place into the nose. 08/21/14  Yes Historical Provider, MD  losartan-hydrochlorothiazide (HYZAAR) 100-25 MG per tablet Take by mouth. 05/07/14  Yes Historical Provider, MD  meloxicam (MOBIC) 7.5 MG tablet Take by mouth. 05/07/14  Yes Historical Provider, MD  MULTIPLE VITAMIN PO Take by mouth. 05/07/14  Yes Historical Provider, MD  ranitidine (ZANTAC) 150 MG tablet Take 1 tablet by mouth daily. 09/17/12  Yes Historical Provider, MD  triamcinolone cream (KENALOG) 0.1 % Apply 1 application topically 2 (two) times daily.   Yes Historical Provider, MD    Patient Active Problem List   Diagnosis Date Noted  .  Allergic rhinitis 09/10/2014  . Arthritis 09/10/2014  . Breast cancer 09/10/2014  . Chronic infection of sinus 09/10/2014  . Acid reflux 09/10/2014  . Auditory impairment 09/10/2014  . BP (high blood pressure) 09/10/2014  . Extreme obesity 09/10/2014  . Arthritis of knee, degenerative 09/10/2014  . Arthritis, degenerative 09/10/2014  . Need for prophylactic hormone replacement therapy (postmenopausal) 09/10/2014  . Restless legs syndrome 09/10/2014  . Apnea, sleep 09/10/2014  . Avitaminosis D 09/10/2014  . History of breast cancer 10/23/2012    Past Medical History  Diagnosis Date  . Arthritis 1993  . Hypertension 15 years  . Colon polyp 2005  . Cancer 2012    Left Breast  . Malignant neoplasm of breast (female), unspecified site     left breast    History   Social History  . Marital Status: Married    Spouse Name: N/A  . Number of Children: N/A  . Years of Education: N/A   Occupational History  . Not on file.   Social History Main Topics  . Smoking status: Never Smoker   . Smokeless tobacco: Never Used  . Alcohol Use: No  . Drug Use: No  . Sexual Activity: No   Other Topics Concern  . Not on file   Social History Narrative    Allergies  Allergen Reactions  . Tape     Paper tape - blisters    Review of Systems  Constitutional: Negative for  fever, chills and malaise/fatigue.  Respiratory: Positive for shortness of breath. Negative for cough, hemoptysis and sputum production.   Cardiovascular: Negative for chest pain, palpitations, orthopnea, claudication and leg swelling.  Gastrointestinal: Positive for heartburn (at times.). Negative for nausea, vomiting and abdominal pain.  Genitourinary: Positive for urgency and frequency. Negative for dysuria.       Incontinence also  Musculoskeletal: Positive for joint pain (in the right hip and has ortho appt set up at the end of the month.). Negative for myalgias, back pain and neck pain.  Neurological: Negative  for dizziness, tingling, tremors, weakness and headaches.  Psychiatric/Behavioral: Negative for depression and suicidal ideas. The patient is not nervous/anxious.     Immunization History  Administered Date(s) Administered  . Influenza-Unspecified 01/01/2013   Objective:  BP 128/82 mmHg  Pulse 84  Temp(Src) 97.8 F (36.6 C)  Resp 16  Wt 274 lb (124.286 kg)  Physical Exam  Constitutional: She is oriented to person, place, and time and well-developed, well-nourished, and in no distress.  HENT:  Head: Normocephalic.  Eyes: Conjunctivae are normal. Pupils are equal, round, and reactive to light.  Neck: Normal range of motion. Neck supple.  Cardiovascular: Normal rate, regular rhythm, normal heart sounds and intact distal pulses.   No murmur heard. Pulmonary/Chest: Effort normal and breath sounds normal. No respiratory distress. She has no wheezes. She has no rales.  Abdominal: Soft. Bowel sounds are normal. She exhibits no distension. There is no tenderness. There is no rebound.  Musculoskeletal: She exhibits edema (trace bilateral lower extremeties) and tenderness.  Tender lower LS spine and Left SI joint, sciatic notch.  Neurological: She is alert and oriented to person, place, and time. Gait normal.  Psychiatric: Mood, memory, affect and judgment normal.     Assessment and Plan :  1. Essential hypertension Stable. Continue current medication  2. Other allergic rhinitis Stable.  3. Gastroesophageal reflux disease, esophagitis presence not specified Stable. Will write Zantac 150 mg twice daily.  4. Arthritis/facet arthropathy Stable. Refills given of Meloxicam and discussed potential risks of taking this medication regularly.  5. Extreme obesity Work on habits. 6. Sciatica Take Meloxicam. Keep appointment with ortho at the end of August.    Patient was seen and examined by Dr. Eulas Post and note was scribed by Theressa Millard, RMA.    Miguel Aschoff MD [facility] 11/09/2014 2:09 PM

## 2014-11-12 DIAGNOSIS — I10 Essential (primary) hypertension: Secondary | ICD-10-CM | POA: Diagnosis not present

## 2014-11-12 DIAGNOSIS — E785 Hyperlipidemia, unspecified: Secondary | ICD-10-CM | POA: Diagnosis not present

## 2014-11-13 LAB — COMPREHENSIVE METABOLIC PANEL
ALK PHOS: 73 IU/L (ref 39–117)
ALT: 22 IU/L (ref 0–32)
AST: 27 IU/L (ref 0–40)
Albumin/Globulin Ratio: 1.5 (ref 1.1–2.5)
Albumin: 3.8 g/dL (ref 3.5–4.8)
BILIRUBIN TOTAL: 0.4 mg/dL (ref 0.0–1.2)
BUN / CREAT RATIO: 21 (ref 11–26)
BUN: 19 mg/dL (ref 8–27)
CHLORIDE: 102 mmol/L (ref 97–108)
CO2: 24 mmol/L (ref 18–29)
CREATININE: 0.9 mg/dL (ref 0.57–1.00)
Calcium: 9.7 mg/dL (ref 8.7–10.3)
GFR, EST AFRICAN AMERICAN: 71 mL/min/{1.73_m2} (ref >59)
GFR, EST NON AFRICAN AMERICAN: 61 mL/min/{1.73_m2} (ref >59)
Globulin, Total: 2.6 g/dL (ref 1.5–4.5)
Glucose: 100 mg/dL — ABNORMAL HIGH (ref 65–99)
POTASSIUM: 4.4 mmol/L (ref 3.5–5.2)
Sodium: 140 mmol/L (ref 134–144)
Total Protein: 6.4 g/dL (ref 6.0–8.5)

## 2014-11-13 LAB — CBC WITH DIFFERENTIAL/PLATELET
BASOS ABS: 0.1 10*3/uL (ref 0.0–0.2)
Basos: 1 %
EOS (ABSOLUTE): 0.2 10*3/uL (ref 0.0–0.4)
Eos: 4 %
HEMATOCRIT: 41 % (ref 34.0–46.6)
Hemoglobin: 13.6 g/dL (ref 11.1–15.9)
IMMATURE GRANS (ABS): 0 10*3/uL (ref 0.0–0.1)
Immature Granulocytes: 0 %
LYMPHS: 29 %
Lymphocytes Absolute: 1.9 10*3/uL (ref 0.7–3.1)
MCH: 30 pg (ref 26.6–33.0)
MCHC: 33.2 g/dL (ref 31.5–35.7)
MCV: 90 fL (ref 79–97)
Monocytes Absolute: 0.7 10*3/uL (ref 0.1–0.9)
Monocytes: 11 %
NEUTROS ABS: 3.6 10*3/uL (ref 1.4–7.0)
Neutrophils: 55 %
Platelets: 283 10*3/uL (ref 150–379)
RBC: 4.54 x10E6/uL (ref 3.77–5.28)
RDW: 13.7 % (ref 12.3–15.4)
WBC: 6.5 10*3/uL (ref 3.4–10.8)

## 2014-11-13 LAB — TSH: TSH: 2.21 u[IU]/mL (ref 0.450–4.500)

## 2014-11-13 LAB — LIPID PANEL WITH LDL/HDL RATIO
Cholesterol, Total: 206 mg/dL — ABNORMAL HIGH (ref 100–199)
HDL: 43 mg/dL (ref >39)
LDL Calculated: 134 mg/dL — ABNORMAL HIGH (ref 0–99)
LDl/HDL Ratio: 3.1 ratio units (ref 0.0–3.2)
Triglycerides: 145 mg/dL (ref 0–149)
VLDL CHOLESTEROL CAL: 29 mg/dL (ref 5–40)

## 2014-11-20 DIAGNOSIS — Z853 Personal history of malignant neoplasm of breast: Secondary | ICD-10-CM | POA: Diagnosis not present

## 2014-11-20 DIAGNOSIS — Z9889 Other specified postprocedural states: Secondary | ICD-10-CM | POA: Diagnosis not present

## 2014-11-20 DIAGNOSIS — Z08 Encounter for follow-up examination after completed treatment for malignant neoplasm: Secondary | ICD-10-CM | POA: Diagnosis not present

## 2014-11-20 DIAGNOSIS — R928 Other abnormal and inconclusive findings on diagnostic imaging of breast: Secondary | ICD-10-CM | POA: Diagnosis not present

## 2014-11-24 DIAGNOSIS — Z471 Aftercare following joint replacement surgery: Secondary | ICD-10-CM | POA: Diagnosis not present

## 2014-11-24 DIAGNOSIS — M5416 Radiculopathy, lumbar region: Secondary | ICD-10-CM | POA: Diagnosis not present

## 2014-11-24 DIAGNOSIS — M1611 Unilateral primary osteoarthritis, right hip: Secondary | ICD-10-CM | POA: Diagnosis not present

## 2014-11-24 DIAGNOSIS — Z96653 Presence of artificial knee joint, bilateral: Secondary | ICD-10-CM | POA: Diagnosis not present

## 2014-12-01 DIAGNOSIS — M5416 Radiculopathy, lumbar region: Secondary | ICD-10-CM | POA: Diagnosis not present

## 2014-12-03 ENCOUNTER — Ambulatory Visit (INDEPENDENT_AMBULATORY_CARE_PROVIDER_SITE_OTHER): Payer: Medicare Other | Admitting: General Surgery

## 2014-12-03 ENCOUNTER — Encounter: Payer: Self-pay | Admitting: General Surgery

## 2014-12-03 VITALS — BP 142/72 | HR 84 | Resp 16 | Ht 66.0 in | Wt 275.0 lb

## 2014-12-03 DIAGNOSIS — C50412 Malignant neoplasm of upper-outer quadrant of left female breast: Secondary | ICD-10-CM

## 2014-12-03 NOTE — Progress Notes (Signed)
Patient ID: Lisa Paul, female   DOB: April 12, 1934, 80 y.o.   MRN: [id number]  Chief Complaint  Patient presents with  . Follow-up    mammogram     HPI Lisa Paul is a 80 y.o. female here for follow up for left breast cancer. The most recent mammogram was done on 11/20/14. Patient does perform regular self breast checks and gets regular mammograms done. She states that she feels like she is having symptoms from the Anastrozole, hair loss and joint pain.     HPI  Past Medical History  Diagnosis Date  . Arthritis 1993  . Hypertension 15 years  . Colon polyp 2005  . Cancer 2012    Left Breast  . Malignant neoplasm of breast (female), unspecified site     left breast    Past Surgical History  Procedure Laterality Date  . Colonoscopy  05/2003    Dr Nicolasa Ducking [facility]  . Tonsillectomy  age 31  . Carpal tunnel release Bilateral   . Eye surgery Bilateral 2005    cataract  . Joint replacement Bilateral 2007    knees  . Abdominal hysterectomy  30 years ago  . Mammosite balloon placement Left 08/2010    Removal 09/2010  . Breast lumpectomy Left 2012  . Colonoscopy  07/24/12    Family History  Problem Relation Age of Onset  . Colon cancer Mother   . Stroke Brother     possibly. Pt is unsure.    Social History Social History  Substance Use Topics  . Smoking status: Never Smoker   . Smokeless tobacco: Never Used  . Alcohol Use: No    Allergies  Allergen Reactions  . Tape     Paper tape - blisters    Current Outpatient Prescriptions  Medication Sig Dispense Refill  . anastrozole (ARIMIDEX) 1 MG tablet Take 1 tablet (1 mg total) by mouth daily. 90 tablet 1  . aspirin 81 MG tablet Take 81 mg by mouth daily.    . Fluocinonide 0.1 % CREA Take 1 tablet by mouth daily.    . fluticasone (FLONASE) 50 MCG/ACT nasal spray Place into the nose.    . losartan-hydrochlorothiazide (HYZAAR) 100-25 MG per tablet Take by mouth.    . meloxicam (MOBIC) 7.5 MG tablet Take 1 tablet (7.5  mg total) by mouth daily. 30 tablet 5  . MULTIPLE VITAMIN PO Take by mouth.    . ranitidine (ZANTAC) 150 MG tablet Take 1 tablet (150 mg total) by mouth 2 (two) times daily. 60 tablet 12  . triamcinolone cream (KENALOG) 0.1 % Apply 1 application topically as needed.     . fexofenadine (ALLEGRA ALLERGY) 180 MG tablet Take by mouth.     No current facility-administered medications for this visit.    Review of Systems Review of Systems  Constitutional: Negative.   Respiratory: Negative.   Cardiovascular: Negative.     Blood pressure 142/72, pulse 84, resp. rate 16, height 5\' 6"  (1.676 m), weight 275 lb (124.739 kg).  Physical Exam Physical Exam  Constitutional: She is oriented to person, place, and time. She appears well-developed and well-nourished.  Eyes: Conjunctivae are normal. No scleral icterus.  Neck: Neck supple.  Cardiovascular: Normal rate, regular rhythm and normal heart sounds.   Pulmonary/Chest: Effort normal and breath sounds normal. Right breast exhibits no inverted nipple, no mass, no nipple discharge, no skin change and no tenderness. Left breast exhibits no inverted nipple, no mass, no nipple discharge, no skin change and  no tenderness.  Unchanged scarring/deformity at lumpectomy site upper outer left breast.   Abdominal: Soft. Bowel sounds are normal. There is no hepatomegaly. There is no tenderness.  Lymphadenopathy:    She has no cervical adenopathy.    She has no axillary adenopathy.  Neurological: She is alert and oriented to person, place, and time.  Skin: Skin is warm and dry.  Psychiatric: She has a normal mood and affect.    Data Reviewed Mammogram- stable, post lumpectomy changes  Assessment    CA left breast, 39yrs post lumpectomy,SN biopsy and radiation. Currently on Anastrazole. Doing well     Plan    Follow up in 1 yr with bilateral diagnostic mammogram.     PCP: Milagros Reap 12/03/2014, 4:36 PM

## 2014-12-03 NOTE — Patient Instructions (Signed)
Follow up in one year  Continue self breast exams. Call office for any new breast issues or concerns.  

## 2015-01-01 DIAGNOSIS — M47816 Spondylosis without myelopathy or radiculopathy, lumbar region: Secondary | ICD-10-CM | POA: Diagnosis not present

## 2015-01-01 DIAGNOSIS — M545 Low back pain: Secondary | ICD-10-CM | POA: Diagnosis not present

## 2015-02-18 ENCOUNTER — Ambulatory Visit (INDEPENDENT_AMBULATORY_CARE_PROVIDER_SITE_OTHER): Payer: Medicare Other

## 2015-02-18 DIAGNOSIS — Z23 Encounter for immunization: Secondary | ICD-10-CM | POA: Diagnosis not present

## 2015-03-15 ENCOUNTER — Other Ambulatory Visit: Payer: Self-pay | Admitting: Family Medicine

## 2015-03-20 DIAGNOSIS — M5416 Radiculopathy, lumbar region: Secondary | ICD-10-CM | POA: Diagnosis not present

## 2015-03-20 DIAGNOSIS — M4806 Spinal stenosis, lumbar region: Secondary | ICD-10-CM | POA: Diagnosis not present

## 2015-03-26 DIAGNOSIS — M4316 Spondylolisthesis, lumbar region: Secondary | ICD-10-CM | POA: Diagnosis not present

## 2015-03-26 DIAGNOSIS — M4806 Spinal stenosis, lumbar region: Secondary | ICD-10-CM | POA: Diagnosis not present

## 2015-03-26 DIAGNOSIS — M5416 Radiculopathy, lumbar region: Secondary | ICD-10-CM | POA: Diagnosis not present

## 2015-03-26 DIAGNOSIS — M5126 Other intervertebral disc displacement, lumbar region: Secondary | ICD-10-CM | POA: Diagnosis not present

## 2015-04-19 ENCOUNTER — Other Ambulatory Visit: Payer: Self-pay | Admitting: Family Medicine

## 2015-04-29 ENCOUNTER — Encounter: Payer: Self-pay | Admitting: Family Medicine

## 2015-04-30 ENCOUNTER — Telehealth: Payer: Self-pay | Admitting: *Deleted

## 2015-04-30 ENCOUNTER — Other Ambulatory Visit: Payer: Self-pay | Admitting: *Deleted

## 2015-04-30 DIAGNOSIS — Z853 Personal history of malignant neoplasm of breast: Secondary | ICD-10-CM

## 2015-04-30 MED ORDER — ANASTROZOLE 1 MG PO TABS: 1.0000 mg | ORAL_TABLET | Freq: Every day | ORAL | Status: DC

## 2015-04-30 NOTE — Telephone Encounter (Signed)
Received RX renewal for the Arimidex. Per Dr. Rogue Bussing, ok to renew this prescription for patient. Pt has an apt with Dr. Rogue Bussing scheduled in March 2017.

## 2015-05-05 ENCOUNTER — Telehealth: Payer: Self-pay | Admitting: *Deleted

## 2015-05-05 NOTE — Telephone Encounter (Signed)
Already filled

## 2015-05-10 ENCOUNTER — Encounter: Payer: Self-pay | Admitting: Family Medicine

## 2015-05-10 ENCOUNTER — Ambulatory Visit (INDEPENDENT_AMBULATORY_CARE_PROVIDER_SITE_OTHER): Payer: Medicare Other | Admitting: Family Medicine

## 2015-05-10 VITALS — BP 128/56 | HR 108 | Temp 98.0°F | Resp 18 | Wt 264.0 lb

## 2015-05-10 DIAGNOSIS — M1990 Unspecified osteoarthritis, unspecified site: Secondary | ICD-10-CM | POA: Diagnosis not present

## 2015-05-10 DIAGNOSIS — K219 Gastro-esophageal reflux disease without esophagitis: Secondary | ICD-10-CM | POA: Diagnosis not present

## 2015-05-10 DIAGNOSIS — I10 Essential (primary) hypertension: Secondary | ICD-10-CM | POA: Diagnosis not present

## 2015-05-10 DIAGNOSIS — E785 Hyperlipidemia, unspecified: Secondary | ICD-10-CM

## 2015-05-10 DIAGNOSIS — J309 Allergic rhinitis, unspecified: Secondary | ICD-10-CM

## 2015-05-10 MED ORDER — FLUTICASONE PROPIONATE 50 MCG/ACT NA SUSP: 2.0000 | Freq: Every day | NASAL | Status: DC

## 2015-05-10 NOTE — Progress Notes (Signed)
Patient ID: Lisa Paul, female   DOB: [birth year], 81 y.o.   MRN: [id number]    Subjective:  HPI  Patient is here for 6 months follow up.  Hypertension follow up:  Patient has not been checking her B/P. She has been helping with her husband after get got paralyzide. NO cardiac symptoms. . BP Readings from Last 3 Encounters:  05/10/15 128/56  12/03/14 142/72  11/09/14 128/82    GERD: Patient takes Ranitidine when she needs it. Symptoms are stable with current management. Lab Results  Component Value Date   CHOL 206* 11/12/2014   HDL 43 11/12/2014   LDLCALC 134* 11/12/2014   TRIG 145 11/12/2014     Prior to Admission medications   Medication Sig Start Date End Date Taking? Authorizing Provider  anastrozole (ARIMIDEX) 1 MG tablet Take 1 tablet (1 mg total) by mouth daily. 04/30/15  Yes Cammie Sickle, MD  aspirin 81 MG tablet Take 81 mg by mouth daily.   Yes Historical Provider, MD  fexofenadine (ALLEGRA ALLERGY) 180 MG tablet Take by mouth. 12/06/10  Yes Historical Provider, MD  Fluocinonide 0.1 % CREA Take 1 tablet by mouth daily. 08/14/12  Yes Historical Provider, MD  fluticasone (FLONASE) 50 MCG/ACT nasal spray Place into the nose. 08/21/14  Yes Historical Provider, MD  losartan-hydrochlorothiazide (HYZAAR) 100-25 MG tablet TAKE 1 TABLET BY MOUTH EVERY DAY 03/15/15  Yes Robyn Galati Maceo Pro., MD  meloxicam (MOBIC) 7.5 MG tablet TAKE 1 TABLET (7.5 MG TOTAL) BY MOUTH DAILY. 04/21/15  Yes Yerick Eggebrecht Maceo Pro., MD  MULTIPLE VITAMIN PO Take by mouth. 05/07/14  Yes Historical Provider, MD  ranitidine (ZANTAC) 150 MG tablet Take 1 tablet (150 mg total) by mouth 2 (two) times daily. 11/09/14  Yes Avari Gelles Maceo Pro., MD  triamcinolone cream (KENALOG) 0.1 % Apply 1 application topically as needed.    Yes Historical Provider, MD    Patient Active Problem List   Diagnosis Date Noted  . Allergic rhinitis 09/10/2014  . Arthritis 09/10/2014  . Breast cancer ([place]) 09/10/2014  .  Chronic infection of sinus 09/10/2014  . Acid reflux 09/10/2014  . Auditory impairment 09/10/2014  . BP (high blood pressure) 09/10/2014  . Extreme obesity ([place]) 09/10/2014  . Arthritis of knee, degenerative 09/10/2014  . Arthritis, degenerative 09/10/2014  . Need for prophylactic hormone replacement therapy (postmenopausal) 09/10/2014  . Restless legs syndrome 09/10/2014  . Apnea, sleep 09/10/2014  . Avitaminosis D 09/10/2014  . History of breast cancer 10/23/2012    Past Medical History  Diagnosis Date  . Arthritis 1993  . Hypertension 15 years  . Colon polyp 2005  . Cancer 2012    Left Breast  . Malignant neoplasm of breast (female), unspecified site     left breast    Social History   Social History  . Marital Status: Married    Spouse Name: N/A  . Number of Children: N/A  . Years of Education: N/A   Occupational History  . Not on file.   Social History Main Topics  . Smoking status: Never Smoker   . Smokeless tobacco: Never Used  . Alcohol Use: No  . Drug Use: No  . Sexual Activity: No   Other Topics Concern  . Not on file   Social History Narrative    Allergies  Allergen Reactions  . Tape     Paper tape - blisters    Review of Systems  Constitutional: Negative.   Respiratory: Negative.   Cardiovascular:  Negative.   Gastrointestinal: Negative.   Musculoskeletal: Positive for joint pain.    Immunization History  Administered Date(s) Administered  . Influenza, High Dose Seasonal PF 02/18/2015  . Influenza-Unspecified 01/01/2013   Objective:  BP 128/56 mmHg  Pulse 108  Temp(Src) 98 F (36.7 C)  Resp 18  Wt 264 lb (119.75 kg)  Physical Exam  Constitutional: She is oriented to person, place, and time and well-developed, well-nourished, and in no distress.  HENT:  Head: Normocephalic and atraumatic.  Eyes: Conjunctivae are normal. Pupils are equal, round, and reactive to light.  Neck: Normal range of motion. Neck supple.    Cardiovascular: Normal rate, regular rhythm, normal heart sounds and intact distal pulses.   No murmur heard. Pulmonary/Chest: Effort normal and breath sounds normal. No respiratory distress. She has no wheezes.  Musculoskeletal: Normal range of motion. She exhibits no edema or tenderness.  Neurological: She is alert and oriented to person, place, and time.  Psychiatric: Mood, memory, affect and judgment normal.    Lab Results  Component Value Date   WBC 6.5 11/12/2014   HCT 41.0 11/12/2014   PLT 283 11/12/2014   GLUCOSE 100* 11/12/2014   CHOL 206* 11/12/2014   TRIG 145 11/12/2014   HDL 43 11/12/2014   LDLCALC 134* 11/12/2014   TSH 2.210 11/12/2014    CMP     Component Value Date/Time   NA 140 11/12/2014 1133   K 4.4 11/12/2014 1133   CL 102 11/12/2014 1133   CO2 24 11/12/2014 1133   GLUCOSE 100* 11/12/2014 1133   BUN 19 11/12/2014 1133   CREATININE 0.90 11/12/2014 1133   CALCIUM 9.7 11/12/2014 1133   PROT 6.4 11/12/2014 1133   ALBUMIN 3.8 11/12/2014 1133   AST 27 11/12/2014 1133   ALT 22 11/12/2014 1133   ALKPHOS 73 11/12/2014 1133   BILITOT 0.4 11/12/2014 1133   GFRNONAA 61 11/12/2014 1133   GFRAA 71 11/12/2014 1133    Assessment and Plan :  1. Essential hypertension Stable today. Continue current medication.  2. Gastroesophageal reflux disease, esophagitis presence not specified Stable.  3. Arthritis Patient states she uses Meloxicam daily now-1 tablet in the evening.  4. Hyperlipidemia Stable on last check in august.  5. Allergic rhinitis, unspecified allergic rhinitis type Stable. Refill on Flonase given. 6.Adjustment Reaction Pt struggling with her husbands failing health,she is just coming to grips with fact that he will not improve.  Patient was seen and examined by Dr. Eulas Post and note was scribed by Theressa Millard, RMA.    Miguel Aschoff MD [facility] 05/10/2015 1:44 PM

## 2015-05-18 ENCOUNTER — Encounter: Payer: Self-pay | Admitting: *Deleted

## 2015-06-07 ENCOUNTER — Encounter: Payer: Self-pay | Admitting: Internal Medicine

## 2015-06-07 ENCOUNTER — Inpatient Hospital Stay: Payer: Medicare Other

## 2015-06-07 ENCOUNTER — Inpatient Hospital Stay: Payer: Medicare Other | Attending: Internal Medicine | Admitting: Internal Medicine

## 2015-06-07 VITALS — BP 123/77 | HR 84 | Temp 98.9°F | Ht 66.2 in | Wt 258.7 lb

## 2015-06-07 DIAGNOSIS — I10 Essential (primary) hypertension: Secondary | ICD-10-CM | POA: Insufficient documentation

## 2015-06-07 DIAGNOSIS — Z7982 Long term (current) use of aspirin: Secondary | ICD-10-CM | POA: Insufficient documentation

## 2015-06-07 DIAGNOSIS — Z86010 Personal history of colonic polyps: Secondary | ICD-10-CM | POA: Insufficient documentation

## 2015-06-07 DIAGNOSIS — K219 Gastro-esophageal reflux disease without esophagitis: Secondary | ICD-10-CM | POA: Insufficient documentation

## 2015-06-07 DIAGNOSIS — Z923 Personal history of irradiation: Secondary | ICD-10-CM | POA: Insufficient documentation

## 2015-06-07 DIAGNOSIS — Z79811 Long term (current) use of aromatase inhibitors: Secondary | ICD-10-CM | POA: Diagnosis not present

## 2015-06-07 DIAGNOSIS — Z170 Estrogen receptor positive status [ER+]: Secondary | ICD-10-CM | POA: Diagnosis not present

## 2015-06-07 DIAGNOSIS — C50912 Malignant neoplasm of unspecified site of left female breast: Secondary | ICD-10-CM | POA: Insufficient documentation

## 2015-06-07 DIAGNOSIS — M1990 Unspecified osteoarthritis, unspecified site: Secondary | ICD-10-CM | POA: Insufficient documentation

## 2015-06-07 LAB — CBC WITH DIFFERENTIAL/PLATELET
Basophils Absolute: 0.1 10*3/uL (ref 0–0.1)
Basophils Relative: 1 %
EOS PCT: 2 %
Eosinophils Absolute: 0.2 10*3/uL (ref 0–0.7)
HCT: 42 % (ref 35.0–47.0)
Hemoglobin: 14.3 g/dL (ref 12.0–16.0)
LYMPHS ABS: 2.4 10*3/uL (ref 1.0–3.6)
LYMPHS PCT: 25 %
MCH: 30.8 pg (ref 26.0–34.0)
MCHC: 34.1 g/dL (ref 32.0–36.0)
MCV: 90.4 fL (ref 80.0–100.0)
MONOS PCT: 12 %
Monocytes Absolute: 1.1 10*3/uL — ABNORMAL HIGH (ref 0.2–0.9)
Neutro Abs: 5.9 10*3/uL (ref 1.4–6.5)
Neutrophils Relative %: 60 %
PLATELETS: 271 10*3/uL (ref 150–440)
RBC: 4.65 MIL/uL (ref 3.80–5.20)
RDW: 13.8 % (ref 11.5–14.5)
WBC: 9.8 10*3/uL (ref 3.6–11.0)

## 2015-06-07 LAB — COMPREHENSIVE METABOLIC PANEL
ALK PHOS: 69 U/L (ref 38–126)
ALT: 19 U/L (ref 14–54)
ANION GAP: 7 (ref 5–15)
AST: 19 U/L (ref 15–41)
Albumin: 3.9 g/dL (ref 3.5–5.0)
BUN: 29 mg/dL — ABNORMAL HIGH (ref 6–20)
CALCIUM: 9 mg/dL (ref 8.9–10.3)
CHLORIDE: 102 mmol/L (ref 101–111)
CO2: 26 mmol/L (ref 22–32)
CREATININE: 1.01 mg/dL — AB (ref 0.44–1.00)
GFR, EST NON AFRICAN AMERICAN: 52 mL/min — AB (ref >60)
Glucose, Bld: 101 mg/dL — ABNORMAL HIGH (ref 65–99)
Potassium: 3.9 mmol/L (ref 3.5–5.1)
Sodium: 135 mmol/L (ref 135–145)
Total Bilirubin: 0.4 mg/dL (ref 0.3–1.2)
Total Protein: 7.7 g/dL (ref 6.5–8.1)

## 2015-06-07 NOTE — Progress Notes (Signed)
Lisa Paul OFFICE PROGRESS NOTE  Patient Care Team: Jerrol Banana., MD as PCP - General (Unknown Physician Specialty)   SUMMARY OF ONCOLOGIC HISTORY:  # 2012- LEFT BREAST CA; STAGE I [pTc (1.5cm); pN0] ER/PR-Pos; Her 2 NEG; G-2; s/p Lumpec & SLNBx [Dr.Sankar]; s/p mammosite [Dr.Crystal]; LOW RISK Oncotype; No chemo- Arimidex [stopped Feb 2017]  # BMD- 2016- wnl  INTERVAL HISTORY:  This is my first interaction with the patient since I joined the practice September 2016. I reviewed the patient's prior charts/pertinent labs/imaging in detail; findings are summarized above.   81 year old female patient with Above history of left-sided breast cancer is here for follow-up. She is been on anastrozole since 2012. Denies any hot flashes. Denies any unusual bone pain or arthritic symptoms.  No nausea no vomiting. No headaches. Her shortness of breath. No fevers.   She has been frequently visiting the hospital taking care of her husband was sick. Denies any skin rash.   REVIEW OF SYSTEMS:  A complete 10 point review of system is done which is negative except mentioned above/history of present illness.   PAST MEDICAL HISTORY :  Past Medical History  Diagnosis Date  . Arthritis 1993  . Hypertension 15 years  . Colon polyp 2005  . Cancer [facility]) 2012    Left Breast  . Malignant neoplasm of breast (female), unspecified site     left breast  . Allergy   . Cataract   . GERD (gastroesophageal reflux disease)     PAST SURGICAL HISTORY :   Past Surgical History  Procedure Laterality Date  . Colonoscopy  05/2003    Dr Nicolasa Ducking [facility]  . Tonsillectomy  age 44  . Carpal tunnel release Bilateral   . Eye surgery Bilateral 2005    cataract  . Joint replacement Bilateral 2007    knees  . Abdominal hysterectomy  30 years ago  . Mammosite balloon placement Left 08/2010    Removal 09/2010  . Breast lumpectomy Left 2012  . Colonoscopy  07/24/12    FAMILY HISTORY :   Family  History  Problem Relation Age of Onset  . Colon cancer Mother   . Stroke Brother     possibly. Pt is unsure.    SOCIAL HISTORY:   Social History  Substance Use Topics  . Smoking status: Never Smoker   . Smokeless tobacco: Never Used  . Alcohol Use: No    ALLERGIES:  is allergic to tape.  MEDICATIONS:  Current Outpatient Prescriptions  Medication Sig Dispense Refill  . anastrozole (ARIMIDEX) 1 MG tablet Take 1 tablet (1 mg total) by mouth daily. 90 tablet 6  . aspirin 81 MG tablet Take 81 mg by mouth daily.    . fexofenadine (ALLEGRA ALLERGY) 180 MG tablet Take by mouth.    . Fluocinonide 0.1 % CREA Take 1 tablet by mouth daily.    . fluticasone (FLONASE) 50 MCG/ACT nasal spray Place 2 sprays into both nostrils daily. 16 g 12  . losartan-hydrochlorothiazide (HYZAAR) 100-25 MG tablet TAKE 1 TABLET BY MOUTH EVERY DAY 90 tablet 2  . meloxicam (MOBIC) 7.5 MG tablet TAKE 1 TABLET (7.5 MG TOTAL) BY MOUTH DAILY. 30 tablet 2  . MULTIPLE VITAMIN PO Take by mouth.    . ranitidine (ZANTAC) 150 MG tablet Take 1 tablet (150 mg total) by mouth 2 (two) times daily. 60 tablet 12  . triamcinolone cream (KENALOG) 0.1 % Apply 1 application topically as needed.      No  current facility-administered medications for this visit.    PHYSICAL EXAMINATION: ECOG PERFORMANCE STATUS: 0 - Asymptomatic  BP 123/77 mmHg  Pulse 84  Temp(Src) 98.9 F (37.2 C) (Tympanic)  Ht 5' 6.2" (1.681 m)  Wt 258 lb 11.4 oz (117.35 kg)  BMI 41.53 kg/m2  SpO2 98%  Filed Weights   06/07/15 1446  Weight: 258 lb 11.4 oz (117.35 kg)    GENERAL: Well-nourished well-developed; Alert, no distress and comfortable.   Alone.  EYES: no pallor or icterus OROPHARYNX: no thrush or ulceration; good dentition  NECK: supple, no masses felt LYMPH:  no palpable lymphadenopathy in the cervical, axillary or inguinal regions LUNGS: clear to auscultation and  No wheeze or crackles HEART/CVS: regular rate & rhythm and no murmurs; No  lower extremity edema ABDOMEN:abdomen soft, non-tender and normal bowel sounds Musculoskeletal:no cyanosis of digits and no clubbing  PSYCH: alert & oriented x 3 with fluent speech NEURO: no focal motor/sensory deficits SKIN:  no rashes or significant lesions  LABORATORY DATA:  I have reviewed the data as listed    Component Value Date/Time   NA 140 11/12/2014 1133   K 4.4 11/12/2014 1133   CL 102 11/12/2014 1133   CO2 24 11/12/2014 1133   GLUCOSE 100* 11/12/2014 1133   BUN 19 11/12/2014 1133   CREATININE 0.90 11/12/2014 1133   CALCIUM 9.7 11/12/2014 1133   PROT 6.4 11/12/2014 1133   ALBUMIN 3.8 11/12/2014 1133   AST 27 11/12/2014 1133   ALT 22 11/12/2014 1133   ALKPHOS 73 11/12/2014 1133   BILITOT 0.4 11/12/2014 1133   GFRNONAA 61 11/12/2014 1133   GFRAA 71 11/12/2014 1133    No results found for: SPEP, UPEP  Lab Results  Component Value Date   WBC 6.5 11/12/2014   NEUTROABS 3.6 11/12/2014   HCT 41.0 11/12/2014   MCV 90 11/12/2014   PLT 283 11/12/2014      Chemistry      Component Value Date/Time   NA 140 11/12/2014 1133   K 4.4 11/12/2014 1133   CL 102 11/12/2014 1133   CO2 24 11/12/2014 1133   BUN 19 11/12/2014 1133   CREATININE 0.90 11/12/2014 1133      Component Value Date/Time   CALCIUM 9.7 11/12/2014 1133   ALKPHOS 73 11/12/2014 1133   AST 27 11/12/2014 1133   ALT 22 11/12/2014 1133   BILITOT 0.4 11/12/2014 1133       RADIOGRAPHIC STUDIES: I have personally reviewed the radiological images as listed and agreed with the findings in the report. No results found.   ASSESSMENT & PLAN:   # BREAST CANCER- STAGE I [2012; low risk Oncotype]; ER/PR positive HER-2/neu negative status post lumpectomy followed by radiation. Patient has finished 5 years of Arimidex. Discussed regarding extended adjuvant therapy; however patient was in risk Oncotype/stage I/not needing chemotherapy- I think it's reasonable to come off anastrozole at this time. She is  agreeable. Check CBC CMP today.  #  Regards to follow-up; patient wants to follows up with her surgeon Dr. Jamal Collin. I think this is reasonable; and she understands that she will follow-up with [place] if she needs [place].  # 15 minutes face-to-face with the patient discussing the above plan of care; more than 50% of time spent on natural history; counseling and coordination.      Cammie Sickle, MD 06/07/2015 3:16 PM

## 2015-06-07 NOTE — Progress Notes (Signed)
Patient ambulates without assistance, brought to exam room 5.  Patient denies pain or discomfort.  Patient's medical record updated, information provided by patient.

## 2015-06-14 ENCOUNTER — Other Ambulatory Visit: Payer: Self-pay | Admitting: Internal Medicine

## 2015-06-14 ENCOUNTER — Telehealth: Payer: Self-pay | Admitting: *Deleted

## 2015-06-14 NOTE — Telephone Encounter (Signed)
Discussed results with Dr B and then called pt, Advised that kidney functions were slightly elevated and that she needs to hydrate and to follow up with her PCP since she is not wanting to fu here. She was advised that Dr Rosanna Randy has access to our records and she stated she will follow up with him and thanked me for returning her call     Ref Range 7d ago  58mo ago     Sodium 135 - 145 mmol/L 135 140R    Potassium 3.5 - 5.1 mmol/L 3.9 4.4R    Chloride 101 - 111 mmol/L 102 102R    CO2 22 - 32 mmol/L 26 24R    Glucose, Bld 65 - 99 mg/dL 101 (H) 100 (H)    BUN 6 - 20 mg/dL 29 (H) 19R    Creatinine, Ser 0.44 - 1.00 mg/dL 1.01 (H) 0.90R    Calcium 8.9 - 10.3 mg/dL 9.0 9.7R    Total Protein 6.5 - 8.1 g/dL 7.7     Albumin 3.5 - 5.0 g/dL 3.9 3.8R    AST 15 - 41 U/L 19 27R    ALT 14 - 54 U/L 19 22R    Alkaline Phosphatase 38 - 126 U/L 69 73R    Total Bilirubin 0.3 - 1.2 mg/dL 0.4 0.4R    GFR calc non Af Amer >60 mL/min 52 (L) 59 mL/min/1.73" class="[email or web address]" style="cursor: pointer; background-color: rgb(225, 231, 236);" onmouseover='jscript: var varStyle="underline"; var bgColor="#[id number]"; this.style.backgroundColor=bgColor; var children=this.getElementsByTagName("div"); for(var child=0;child 61R    GFR calc Af Amer >60 mL/min >60 59 mL/min/1.73" class="[email or web address]" style="cursor: pointer; background-color: rgb(225, 231, 236);" onmouseover='jscript: var varStyle="underline"; var bgColor="#[id number]"; this.style.backgroundColor=bgColor; var children=this.getElementsByTagName("div"); for(var child=0;child [id number]

## 2015-08-12 ENCOUNTER — Ambulatory Visit: Payer: Self-pay | Admitting: General Surgery

## 2015-09-09 ENCOUNTER — Encounter (INDEPENDENT_AMBULATORY_CARE_PROVIDER_SITE_OTHER): Payer: Self-pay

## 2015-09-09 ENCOUNTER — Encounter: Payer: Self-pay | Admitting: *Deleted

## 2015-09-13 ENCOUNTER — Ambulatory Visit: Payer: Medicare Other | Admitting: Family Medicine

## 2015-09-16 ENCOUNTER — Ambulatory Visit: Payer: Medicare Other | Admitting: General Surgery

## 2015-10-14 ENCOUNTER — Ambulatory Visit (INDEPENDENT_AMBULATORY_CARE_PROVIDER_SITE_OTHER): Payer: Medicare Other | Admitting: Family Medicine

## 2015-10-14 VITALS — BP 138/64 | HR 100 | Temp 98.3°F | Resp 16 | Wt 259.0 lb

## 2015-10-14 DIAGNOSIS — I10 Essential (primary) hypertension: Secondary | ICD-10-CM | POA: Diagnosis not present

## 2015-10-14 DIAGNOSIS — L309 Dermatitis, unspecified: Secondary | ICD-10-CM | POA: Diagnosis not present

## 2015-10-14 DIAGNOSIS — K219 Gastro-esophageal reflux disease without esophagitis: Secondary | ICD-10-CM | POA: Diagnosis not present

## 2015-10-14 DIAGNOSIS — E785 Hyperlipidemia, unspecified: Secondary | ICD-10-CM | POA: Diagnosis not present

## 2015-10-14 DIAGNOSIS — M1990 Unspecified osteoarthritis, unspecified site: Secondary | ICD-10-CM

## 2015-10-14 DIAGNOSIS — E669 Obesity, unspecified: Secondary | ICD-10-CM

## 2015-10-14 DIAGNOSIS — M5431 Sciatica, right side: Secondary | ICD-10-CM | POA: Diagnosis not present

## 2015-10-14 MED ORDER — FLUTICASONE PROPIONATE 50 MCG/ACT NA SUSP: 2.0000 | Freq: Every day | NASAL | Status: DC

## 2015-10-14 MED ORDER — TRAMADOL HCL 50 MG PO TABS: 50.0000 mg | ORAL_TABLET | Freq: Four times a day (QID) | ORAL | Status: DC

## 2015-10-14 MED ORDER — RANITIDINE HCL 150 MG PO TABS: 150.0000 mg | ORAL_TABLET | Freq: Every day | ORAL | Status: DC

## 2015-10-14 MED ORDER — MELOXICAM 7.5 MG PO TABS: ORAL_TABLET | ORAL | Status: DC

## 2015-10-14 NOTE — Progress Notes (Signed)
Subjective:  HPI  Patient is here for follow up. Last office visit was in February. Hypertension: She does not check her b/p at home. BP Readings from Last 3 Encounters:  10/14/15 138/64  06/07/15 123/77  05/10/15 128/56   GERD: symptoms are controlled on Zantac 1 daily.  Hyperlipidemia: She is not on any medications for this. Lab Results  Component Value Date   CHOL 206* 11/12/2014   HDL 43 11/12/2014   LDLCALC 134* 11/12/2014   TRIG 145 11/12/2014   Patient would like to discuss getting Meloxicam for arthritis-takes at least 1 tablet daily and sometimes 2 tablets and Triamcinolone cream for psoriasis refilled through [place]. Also would like to discuss maybe trying Burgess Amor took 1 of her husbands at bedtime and her joint pain improved through the night and the next day.  She is struggling and just tired with dealing with life changes since her husband became quadriplegic.He has been in a nursing home for all of 2017 and she is planning on moving him home next week. Depression screen PHQ 2/9 10/14/2015  Decreased Interest 0  Down, Depressed, Hopeless 1  PHQ - 2 Score 1  Altered sleeping 1  Tired, decreased energy 2  Change in appetite 1  Feeling bad or failure about yourself  0  Trouble concentrating 1  Moving slowly or fidgety/restless 1  Suicidal thoughts 0  PHQ-9 Score 7  Difficult doing work/chores Somewhat difficult    Prior to Admission medications   Medication Sig Start Date End Date Taking? Authorizing Provider  anastrozole (ARIMIDEX) 1 MG tablet Take 1 tablet (1 mg total) by mouth daily. 04/30/15   Cammie Sickle, MD  aspirin 81 MG tablet Take 81 mg by mouth daily.    Historical Provider, MD  fexofenadine (ALLEGRA ALLERGY) 180 MG tablet Take by mouth. 12/06/10   Historical Provider, MD  Fluocinonide 0.1 % CREA Take 1 tablet by mouth daily. 08/14/12   Historical Provider, MD  fluticasone (FLONASE) 50 MCG/ACT nasal spray Place 2 sprays into both nostrils  daily. 05/10/15   Jerrol Banana., MD  losartan-hydrochlorothiazide [facility]) 100-25 MG tablet TAKE 1 TABLET BY MOUTH EVERY DAY 03/15/15   Jerrol Banana., MD  meloxicam (MOBIC) 7.5 MG tablet TAKE 1 TABLET (7.5 MG TOTAL) BY MOUTH DAILY. 04/21/15   Richard Maceo Pro., MD  MULTIPLE VITAMIN PO Take by mouth. 05/07/14   Historical Provider, MD  ranitidine (ZANTAC) 150 MG tablet Take 1 tablet (150 mg total) by mouth 2 (two) times daily. 11/09/14   Jerrol Banana., MD  triamcinolone cream (KENALOG) 0.1 % Apply 1 application topically as needed.     Historical Provider, MD    Patient Active Problem List   Diagnosis Date Noted  . Allergic rhinitis 09/10/2014  . Arthritis 09/10/2014  . Breast cancer ([place]) 09/10/2014  . Chronic infection of sinus 09/10/2014  . Acid reflux 09/10/2014  . Auditory impairment 09/10/2014  . BP (high blood pressure) 09/10/2014  . Extreme obesity ([place]) 09/10/2014  . Arthritis of knee, degenerative 09/10/2014  . Arthritis, degenerative 09/10/2014  . Need for prophylactic hormone replacement therapy (postmenopausal) 09/10/2014  . Restless legs syndrome 09/10/2014  . Apnea, sleep 09/10/2014  . Avitaminosis D 09/10/2014  . History of breast cancer 10/23/2012    Past Medical History  Diagnosis Date  . Arthritis 1993  . Hypertension 15 years  . Colon polyp 2005  . Cancer [facility]) 2012    Left Breast  . Malignant  neoplasm of breast (female), unspecified site     left breast  . Allergy   . Cataract   . GERD (gastroesophageal reflux disease)     Social History   Social History  . Marital Status: Married    Spouse Name: N/A  . Number of Children: N/A  . Years of Education: N/A   Occupational History  . Not on file.   Social History Main Topics  . Smoking status: Never Smoker   . Smokeless tobacco: Never Used  . Alcohol Use: No  . Drug Use: No  . Sexual Activity: No   Other Topics Concern  . Not on file   Social History Narrative     Allergies  Allergen Reactions  . Tape     Paper tape - blisters    Review of Systems  Constitutional: Positive for malaise/fatigue.  HENT: Negative.   Eyes: Negative.   Respiratory: Negative.   Cardiovascular: Negative.   Gastrointestinal: Negative.   Genitourinary: Negative.   Musculoskeletal: Positive for myalgias, back pain, joint pain and neck pain.  Skin: Positive for itching and rash.  Neurological: Negative.   Psychiatric/Behavioral: Positive for depression. The patient is nervous/anxious.     Immunization History  Administered Date(s) Administered  . Influenza, High Dose Seasonal PF 02/18/2015  . Influenza-Unspecified 01/01/2013   Objective:  BP 138/64 mmHg  Pulse 100  Temp(Src) 98.3 F (36.8 C)  Resp 16  Wt 259 lb (117.482 kg)  Physical Exam  Constitutional: She is oriented to person, place, and time and well-developed, well-nourished, and in no distress.  Sig, obese white female in no acute distress  HENT:  Head: Normocephalic and atraumatic.  Right Ear: External ear normal.  Left Ear: External ear normal.  Nose: Nose normal.  Eyes: Conjunctivae are normal. Pupils are equal, round, and reactive to light.  Neck: Normal range of motion. Neck supple.  Cardiovascular: Normal rate, regular rhythm, normal heart sounds and intact distal pulses.   No murmur heard. Pulmonary/Chest: Effort normal and breath sounds normal. No respiratory distress. She has no wheezes.  Abdominal: Soft.  Musculoskeletal: She exhibits no edema or tenderness.  Neurological: She is alert and oriented to person, place, and time. Gait normal.  Skin: Skin is warm and dry.  Psychiatric: Mood, memory, affect and judgment normal.    Lab Results  Component Value Date   WBC 9.8 06/07/2015   HGB 14.3 06/07/2015   HCT 42.0 06/07/2015   PLT 271 06/07/2015   GLUCOSE 101* 06/07/2015   CHOL 206* 11/12/2014   TRIG 145 11/12/2014   HDL 43 11/12/2014   LDLCALC 134* 11/12/2014   TSH  2.210 11/12/2014    CMP     Component Value Date/Time   NA 135 06/07/2015 1535   NA 140 11/12/2014 1133   K 3.9 06/07/2015 1535   CL 102 06/07/2015 1535   CO2 26 06/07/2015 1535   GLUCOSE 101* 06/07/2015 1535   GLUCOSE 100* 11/12/2014 1133   BUN 29* 06/07/2015 1535   BUN 19 11/12/2014 1133   CREATININE 1.01* 06/07/2015 1535   CALCIUM 9.0 06/07/2015 1535   PROT 7.7 06/07/2015 1535   PROT 6.4 11/12/2014 1133   ALBUMIN 3.9 06/07/2015 1535   ALBUMIN 3.8 11/12/2014 1133   AST 19 06/07/2015 1535   ALT 19 06/07/2015 1535   ALKPHOS 69 06/07/2015 1535   BILITOT 0.4 06/07/2015 1535   BILITOT 0.4 11/12/2014 1133   GFRNONAA 52* 06/07/2015 1535   GFRAA >60 06/07/2015 1535  Assessment and Plan :  1. Essential hypertension Stable. Continue current medication.  2. Gastroesophageal reflux disease, esophagitis presence not specified Stable.  3. Arthritis Discussed this in details with patient and the risks of taking Meloxicam regularly and that her kidney function has decreased in the past 1 year. Will follow. Will try Tramadol also.  4. Hyperlipidemia  5. Sciatica, right  6. Eczema Advised patient that stopping Triamcinolone cream is better for patient and not use this constantly. Do not think this is psoriasis. 7. Morbid obesity 8. Osteoarthritis 9. Depression/situational Patient was seen and examined by Dr. Eulas Post and note was scribed by Theressa Millard, RMA.   Miguel Aschoff MD [facility] 10/14/2015 3:13 PM

## 2015-10-15 LAB — RENAL FUNCTION PANEL
Albumin: 4 g/dL (ref 3.5–4.8)
BUN / CREAT RATIO: 22 (ref 12–28)
BUN: 18 mg/dL (ref 8–27)
CALCIUM: 9.4 mg/dL (ref 8.7–10.3)
CHLORIDE: 99 mmol/L (ref 96–106)
CO2: 24 mmol/L (ref 18–29)
Creatinine, Ser: 0.82 mg/dL (ref 0.57–1.00)
GFR calc Af Amer: 79 mL/min/{1.73_m2} (ref >59)
GFR calc non Af Amer: 68 mL/min/{1.73_m2} (ref >59)
Glucose: 95 mg/dL (ref 65–99)
POTASSIUM: 4.1 mmol/L (ref 3.5–5.2)
Phosphorus: 3.5 mg/dL (ref 2.5–4.5)
SODIUM: 141 mmol/L (ref 134–144)

## 2015-10-15 LAB — CBC WITH DIFFERENTIAL/PLATELET
Basophils Absolute: 0.1 10*3/uL (ref 0.0–0.2)
Basos: 1 %
EOS (ABSOLUTE): 0.2 10*3/uL (ref 0.0–0.4)
EOS: 2 %
HEMATOCRIT: 41.1 % (ref 34.0–46.6)
Hemoglobin: 13.7 g/dL (ref 11.1–15.9)
IMMATURE GRANULOCYTES: 0 %
Immature Grans (Abs): 0 10*3/uL (ref 0.0–0.1)
Lymphocytes Absolute: 2.6 10*3/uL (ref 0.7–3.1)
Lymphs: 29 %
MCH: 30 pg (ref 26.6–33.0)
MCHC: 33.3 g/dL (ref 31.5–35.7)
MCV: 90 fL (ref 79–97)
MONOCYTES: 12 %
MONOS ABS: 1 10*3/uL — AB (ref 0.1–0.9)
NEUTROS PCT: 56 %
Neutrophils Absolute: 5.2 10*3/uL (ref 1.4–7.0)
Platelets: 319 10*3/uL (ref 150–379)
RBC: 4.56 x10E6/uL (ref 3.77–5.28)
RDW: 13.5 % (ref 12.3–15.4)
WBC: 9.1 10*3/uL (ref 3.4–10.8)

## 2015-11-10 ENCOUNTER — Other Ambulatory Visit: Payer: Self-pay | Admitting: General Surgery

## 2015-11-10 DIAGNOSIS — C50412 Malignant neoplasm of upper-outer quadrant of left female breast: Secondary | ICD-10-CM

## 2015-11-15 ENCOUNTER — Other Ambulatory Visit: Payer: Self-pay | Admitting: *Deleted

## 2015-11-15 ENCOUNTER — Inpatient Hospital Stay
Admission: RE | Admit: 2015-11-15 | Discharge: 2015-11-15 | Disposition: A | Discharge status: Home / Self Care | Payer: Self-pay | Source: Ambulatory Visit | Attending: *Deleted | Admitting: *Deleted

## 2015-11-15 DIAGNOSIS — Z9289 Personal history of other medical treatment: Secondary | ICD-10-CM

## 2015-11-16 ENCOUNTER — Encounter: Payer: Self-pay | Admitting: *Deleted

## 2015-12-09 ENCOUNTER — Other Ambulatory Visit: Payer: Medicare Other

## 2015-12-09 ENCOUNTER — Ambulatory Visit: Payer: Medicare Other

## 2015-12-14 ENCOUNTER — Ambulatory Visit: Payer: Self-pay | Admitting: Family Medicine

## 2015-12-16 ENCOUNTER — Ambulatory Visit: Payer: Medicare Other | Admitting: General Surgery

## 2015-12-30 ENCOUNTER — Encounter: Payer: Self-pay | Admitting: *Deleted

## 2015-12-30 ENCOUNTER — Ambulatory Visit
Admission: RE | Admit: 2015-12-30 | Discharge: 2015-12-30 | Disposition: A | Discharge status: Home / Self Care | Payer: Medicare Other | Source: Ambulatory Visit | Attending: General Surgery | Admitting: General Surgery

## 2015-12-30 ENCOUNTER — Other Ambulatory Visit: Payer: Self-pay | Admitting: General Surgery

## 2015-12-30 DIAGNOSIS — C50412 Malignant neoplasm of upper-outer quadrant of left female breast: Secondary | ICD-10-CM | POA: Diagnosis not present

## 2015-12-30 DIAGNOSIS — R928 Other abnormal and inconclusive findings on diagnostic imaging of breast: Secondary | ICD-10-CM | POA: Diagnosis not present

## 2016-01-01 ENCOUNTER — Other Ambulatory Visit: Payer: Self-pay | Admitting: Family Medicine

## 2016-01-06 ENCOUNTER — Ambulatory Visit: Payer: Medicare Other | Admitting: General Surgery

## 2016-01-18 ENCOUNTER — Ambulatory Visit: Payer: Medicare Other | Admitting: Family Medicine

## 2016-03-13 DIAGNOSIS — B078 Other viral warts: Secondary | ICD-10-CM | POA: Diagnosis not present

## 2016-03-13 DIAGNOSIS — L821 Other seborrheic keratosis: Secondary | ICD-10-CM | POA: Diagnosis not present

## 2016-03-13 DIAGNOSIS — L300 Nummular dermatitis: Secondary | ICD-10-CM | POA: Diagnosis not present

## 2016-03-13 DIAGNOSIS — L570 Actinic keratosis: Secondary | ICD-10-CM | POA: Diagnosis not present

## 2016-03-13 DIAGNOSIS — D485 Neoplasm of uncertain behavior of skin: Secondary | ICD-10-CM | POA: Diagnosis not present

## 2016-03-22 ENCOUNTER — Encounter: Payer: Self-pay | Admitting: *Deleted

## 2016-04-28 ENCOUNTER — Ambulatory Visit (INDEPENDENT_AMBULATORY_CARE_PROVIDER_SITE_OTHER): Payer: Medicare Other | Admitting: Family Medicine

## 2016-04-28 DIAGNOSIS — Z23 Encounter for immunization: Secondary | ICD-10-CM | POA: Diagnosis not present

## 2016-05-10 ENCOUNTER — Ambulatory Visit (INDEPENDENT_AMBULATORY_CARE_PROVIDER_SITE_OTHER): Payer: Medicare Other | Admitting: Family Medicine

## 2016-05-10 ENCOUNTER — Ambulatory Visit (INDEPENDENT_AMBULATORY_CARE_PROVIDER_SITE_OTHER): Payer: Medicare Other

## 2016-05-10 VITALS — BP 130/66 | HR 88 | Temp 97.7°F | Resp 16 | Wt 259.0 lb

## 2016-05-10 VITALS — BP 130/66 | HR 88 | Temp 97.7°F | Ht 66.0 in | Wt 259.2 lb

## 2016-05-10 DIAGNOSIS — I10 Essential (primary) hypertension: Secondary | ICD-10-CM | POA: Diagnosis not present

## 2016-05-10 DIAGNOSIS — M1990 Unspecified osteoarthritis, unspecified site: Secondary | ICD-10-CM

## 2016-05-10 DIAGNOSIS — Z0000 Encounter for general adult medical examination without abnormal findings: Secondary | ICD-10-CM | POA: Diagnosis not present

## 2016-05-10 DIAGNOSIS — E6609 Other obesity due to excess calories: Secondary | ICD-10-CM

## 2016-05-10 DIAGNOSIS — N3281 Overactive bladder: Secondary | ICD-10-CM

## 2016-05-10 DIAGNOSIS — K219 Gastro-esophageal reflux disease without esophagitis: Secondary | ICD-10-CM

## 2016-05-10 DIAGNOSIS — R5383 Other fatigue: Secondary | ICD-10-CM

## 2016-05-10 DIAGNOSIS — Z6841 Body Mass Index (BMI) 40.0 and over, adult: Secondary | ICD-10-CM

## 2016-05-10 DIAGNOSIS — E7849 Other hyperlipidemia: Secondary | ICD-10-CM

## 2016-05-10 DIAGNOSIS — IMO0001 Reserved for inherently not codable concepts without codable children: Secondary | ICD-10-CM

## 2016-05-10 DIAGNOSIS — J3089 Other allergic rhinitis: Secondary | ICD-10-CM

## 2016-05-10 DIAGNOSIS — E784 Other hyperlipidemia: Secondary | ICD-10-CM

## 2016-05-10 MED ORDER — OXYBUTYNIN CHLORIDE 5 MG PO TABS
5.0000 mg | ORAL_TABLET | Freq: Two times a day (BID) | ORAL | 1 refills | Status: DC
Start: 2016-05-10 — End: 2016-10-26

## 2016-05-10 MED ORDER — FLUTICASONE PROPIONATE 50 MCG/ACT NA SUSP: 2.0000 | Freq: Every day | NASAL | 12 refills | Status: DC

## 2016-05-10 NOTE — Progress Notes (Signed)
Subjective:   Lisa Paul is a 82 y.o. female who presents for Medicare Annual (Subsequent) preventive examination.  Review of Systems:  N/A  Cardiac Risk Factors include: advanced age (>42men, >92 women);hypertension;obesity (BMI >30kg/m2)     Objective:     Vitals: BP 130/66 (BP Location: Right Arm)   Pulse 88   Temp 97.7 F (36.5 C) (Oral)   Ht 5\' 6"  (1.676 m)   Wt 259 lb 3.2 oz (117.6 kg)   BMI 41.84 kg/m   Body mass index is 41.84 kg/m.   Tobacco History  Smoking Status  . Never Smoker  Smokeless Tobacco  . Never Used     Counseling given: Not Answered   Past Medical History:  Diagnosis Date  . Allergy   . Arthritis 1993  . Cancer [facility] 2012   Left Breast with lumpectomy and mammosite  . Cataract   . Colon polyp 2005  . GERD (gastroesophageal reflux disease)   . Hypertension 15 years  . Malignant neoplasm of breast (female), unspecified site    left breast   Past Surgical History:  Procedure Laterality Date  . ABDOMINAL HYSTERECTOMY  30 years ago  . BREAST BIOPSY Left 2012   invasive mammary carcinoma  . BREAST LUMPECTOMY Left 2012  . CARPAL TUNNEL RELEASE Bilateral   . COLONOSCOPY  [facility]  . COLONOSCOPY  07/24/12  . EYE SURGERY Bilateral 2005   cataract  . JOINT REPLACEMENT Bilateral 2007   knees  . mammosite balloon placement Left 08/2010   Removal 09/2010  . TONSILLECTOMY  age 31   Family History  Problem Relation Age of Onset  . Colon cancer Mother   . Stroke Brother     possibly. Pt is unsure.   History  Sexual Activity  . Sexual activity: No    Outpatient Encounter Prescriptions as of 05/10/2016  Medication Sig  . anastrozole (ARIMIDEX) 1 MG tablet Take 1 tablet (1 mg total) by mouth daily.  Marland Kitchen aspirin 81 MG tablet Take 81 mg by mouth daily.   Marland Kitchen aspirin-sod bicarb-citric acid (ALKA-SELTZER) 325 MG TBEF tablet Take 325 mg by mouth every 6 (six) hours as needed.  . fexofenadine (ALLEGRA ALLERGY) 180 MG  tablet Take by mouth. Reported on 10/14/2015  . fluticasone (FLONASE) 50 MCG/ACT nasal spray Place 2 sprays into both nostrils daily.  Marland Kitchen losartan-hydrochlorothiazide (HYZAAR) 100-25 MG tablet TAKE 1 TABLET BY MOUTH EVERY DAY  . meloxicam (MOBIC) 7.5 MG tablet TAKE 1 TABLET (7.5 MG TOTAL) BY MOUTH DAILY. (Patient taking differently: Take 7.5 mg by mouth daily. TAKE 1 TABLET (7.5 MG TOTAL) BY MOUTH DAILY.)  . MULTIPLE VITAMIN PO Take by mouth.  . ranitidine (ZANTAC) 150 MG tablet Take 1 tablet (150 mg total) by mouth at bedtime.  . traMADol (ULTRAM) 50 MG tablet Take 1 tablet (50 mg total) by mouth 4 (four) times daily.   No facility-administered encounter medications on file as of 05/10/2016.     Activities of Daily Living In your present state of health, do you have any difficulty performing the following activities: 05/10/2016  Hearing? Y  Vision? N  Difficulty concentrating or making decisions? N  Walking or climbing stairs? Y  Dressing or bathing? N  Doing errands, shopping? N  Preparing Food and eating ? N  Using the Toilet? N  In the past six months, have you accidently leaked urine? Y  Do you have problems with loss of bowel control? N  Managing  your Medications? N  Managing your Finances? N  Housekeeping or managing your Housekeeping? N  Some recent data might be hidden    Patient Care Team: Jerrol Banana., MD as PCP - General (Unknown Physician Specialty) Estill Cotta, MD as Consulting Physician (Ophthalmology) Clyde Canterbury, MD as Referring Physician (Otolaryngology) Ralene Bathe, MD as Consulting Physician (Dermatology) Seeplaputhur Robinette Haines, MD as Consulting Physician (General Surgery)    Assessment:     Exercise Activities and Dietary recommendations Current Exercise Habits: The patient does not participate in regular exercise at present, Exercise limited by: orthopedic condition(s)  Goals    . Exercise           Starting in spring 2018, I will start  exercising at least 3 days a week.      Fall Risk Fall Risk  05/10/2016 10/14/2015 05/10/2015  Falls in the past year? No No No   Depression Screen PHQ 2/9 Scores 05/10/2016 10/14/2015 05/10/2015 05/10/2015  PHQ - 2 Score 3 1 1  0  PHQ- 9 Score 7 7 - -     Cognitive Function     6CIT Screen 05/10/2016  What Year? 0 points  What month? 0 points  What time? 0 points  Count back from 20 0 points  Months in reverse 0 points  Repeat phrase 6 points  Total Score 6    Immunization History  Administered Date(s) Administered  . Influenza, High Dose Seasonal PF 02/18/2015, 04/28/2016  . Influenza-Unspecified 01/01/2013   Screening Tests Health Maintenance  Topic Date Due  . TETANUS/TDAP  05/07/2024  . INFLUENZA VACCINE  Completed  . DEXA SCAN  Completed  . ZOSTAVAX  Completed  . PNA vac Low Risk Adult  Completed      Plan:  I have personally reviewed and addressed the Medicare Annual Wellness questionnaire and have noted the following in the patient's chart:  A. Medical and social history B. Use of alcohol, tobacco or illicit drugs  C. Current medications and supplements D. Functional ability and status E.  Nutritional status F.  Physical activity G. Advance directives H. List of other physicians I.  Hospitalizations, surgeries, and ER visits in previous 12 months J.  [place] such as hearing and vision if needed, cognitive and depression L. Referrals and appointments - none  In addition, I have reviewed and discussed with patient certain preventive protocols, quality metrics, and best practice recommendations. A written personalized care plan for preventive services as well as general preventive health recommendations were provided to patient.  See attached scanned questionnaire for additional information.   Signed,  Fabio Neighbors, LPN Nurse Health Advisor   MD Recommendations: None. I have reviewed the health advisors note, was  available for consultation  and I agree with documentation and plan. Miguel Aschoff MD [facility]

## 2016-05-10 NOTE — Progress Notes (Signed)
Lisa Paul  MRN: [id number] DOB: 1934/09/19  Subjective:  HPI  Patient is here for follow up. Had appointment with [facility] today for Wellness. Hypertension: Patient has not been checking her b/p. No cardiac symptoms. BP Readings from Last 3 Encounters:  05/10/16 130/66  05/10/16 130/66  10/14/15 138/64   Takes Ranitidine daily and sometimes has to take [place] with it. When she has some heartburn she gets some chest pain. But not any other times. Her husband passed away in 02-09-16 and she is trying to get through it as best as she can. Depression screen [facility] 2/9 05/10/2016 10/14/2015 05/10/2015  Decreased Interest 1 0 -  Down, Depressed, Hopeless 2 1 1   PHQ - 2 Score 3 1 1   Altered sleeping 2 1 -  Tired, decreased energy 0 2 -  Change in appetite 0 1 -  Feeling bad or failure about yourself  0 0 -  Trouble concentrating 0 1 -  Moving slowly or fidgety/restless 1 1 -  Suicidal thoughts 1 0 -  PHQ-9 Score 7 7 -  Difficult doing work/chores Somewhat difficult Somewhat difficult -   Labs: renal and CBC on 10/14/15, MetC 06/07/15, Lipid and TSH on 11/12/14 Patient Active Problem List   Diagnosis Date Noted  . Allergic rhinitis 09/10/2014  . Arthritis 09/10/2014  . Breast cancer ([place]) 09/10/2014  . Chronic infection of sinus 09/10/2014  . Acid reflux 09/10/2014  . Auditory impairment 09/10/2014  . BP (high blood pressure) 09/10/2014  . Extreme obesity ([place]) 09/10/2014  . Arthritis of knee, degenerative 09/10/2014  . Arthritis, degenerative 09/10/2014  . Need for prophylactic hormone replacement therapy (postmenopausal) 09/10/2014  . Restless legs syndrome 09/10/2014  . Apnea, sleep 09/10/2014  . Avitaminosis D 09/10/2014  . History of breast cancer 10/23/2012    Past Medical History:  Diagnosis Date  . Allergy   . Arthritis 1993  . Cancer [facility]) 2012   Left Breast with lumpectomy and mammosite  . Cataract   . Colon polyp 2005  . GERD (gastroesophageal  reflux disease)   . Hypertension 15 years  . Malignant neoplasm of breast (female), unspecified site    left breast    Social History   Social History  . Marital status: Married    Spouse name: N/A  . Number of children: N/A  . Years of education: N/A   Occupational History  . Not on file.   Social History Main Topics  . Smoking status: Never Smoker  . Smokeless tobacco: Never Used  . Alcohol use No  . Drug use: No  . Sexual activity: No   Other Topics Concern  . Not on file   Social History Narrative  . No narrative on file    Outpatient Encounter Prescriptions as of 05/10/2016  Medication Sig Note  . anastrozole (ARIMIDEX) 1 MG tablet Take 1 tablet (1 mg total) by mouth daily.   Marland Kitchen aspirin 81 MG tablet Take 81 mg by mouth daily.    Marland Kitchen aspirin-sod bicarb-citric acid (ALKA-SELTZER) 325 MG TBEF tablet Take 325 mg by mouth every 6 (six) hours as needed.   . fexofenadine (ALLEGRA ALLERGY) 180 MG tablet Take by mouth. Reported on 10/14/2015 09/10/2014: Received from: [facility]  . fluticasone (FLONASE) 50 MCG/ACT nasal spray Place 2 sprays into both nostrils daily.   Marland Kitchen losartan-hydrochlorothiazide (HYZAAR) 100-25 MG tablet TAKE 1 TABLET BY MOUTH EVERY DAY   . meloxicam (MOBIC) 7.5 MG tablet TAKE 1 TABLET (7.5 MG TOTAL)  BY MOUTH DAILY. (Patient taking differently: Take 7.5 mg by mouth daily. TAKE 1 TABLET (7.5 MG TOTAL) BY MOUTH DAILY.)   . MULTIPLE VITAMIN PO Take by mouth. 09/10/2014: Received from: [facility]  . ranitidine (ZANTAC) 150 MG tablet Take 1 tablet (150 mg total) by mouth at bedtime.   . traMADol (ULTRAM) 50 MG tablet Take 1 tablet (50 mg total) by mouth 4 (four) times daily.   . [DISCONTINUED] fluticasone (FLONASE) 50 MCG/ACT nasal spray Place 2 sprays into both nostrils daily.   Marland Kitchen oxybutynin (DITROPAN) 5 MG tablet Take 1 tablet (5 mg total) by mouth 2 (two) times daily.    No facility-administered encounter medications on file as  of 05/10/2016.     Allergies  Allergen Reactions  . Tape     Paper tape - blisters    Review of Systems  Constitutional: Negative.   Respiratory: Negative.   Cardiovascular: Negative.   Gastrointestinal: Negative.        Stable on medication  Genitourinary:       Urinary incontinence  Musculoskeletal: Positive for back pain and joint pain.  Neurological: Positive for dizziness (at times with standing up).  Psychiatric/Behavioral: Positive for depression. The patient has insomnia.     Objective:  BP 130/66   Pulse 88   Temp 97.7 F (36.5 C)   Resp 16   Wt 259 lb (117.5 kg)   BMI 41.80 kg/m   Physical Exam  Constitutional: She is oriented to person, place, and time and well-developed, well-nourished, and in no distress.  HENT:  Head: Normocephalic and atraumatic.  Right Ear: External ear normal.  Left Ear: External ear normal.  Nose: Nose normal.  Eyes: Conjunctivae are normal. Pupils are equal, round, and reactive to light. No scleral icterus.  Neck: Normal range of motion. Neck supple. No thyromegaly present.  Cardiovascular: Normal rate, regular rhythm, normal heart sounds and intact distal pulses.   No murmur heard. Pulmonary/Chest: Effort normal and breath sounds normal.  Abdominal: Soft.  Neurological: She is alert and oriented to person, place, and time. Gait normal. GCS score is 15.  Skin: Skin is warm and dry.  Psychiatric: Mood, memory, affect and judgment normal.    Assessment and Plan :  1. Other hyperlipidemia - Comprehensive metabolic panel - Lipid Panel With LDL/HDL Ratio  2. Essential hypertension - CBC w/Diff/Platelet - Comprehensive metabolic panel  3. Gastroesophageal reflux disease, esophagitis presence not specified - TSH  4. Arthritis  5. Other allergic rhinitis Refill Flonase provided.  6. Class 3 obesity due to excess calories with serious comorbidity and body mass index (BMI) of 40.0 to 44.9 in adult ([place])  7. Overactive  bladder Try oxybutynin. Re check in 1 month on progress.  8. Other fatigue - TSH  HPI, Exam and A&P transcribed under direction and in the presence of Miguel Aschoff, MD. 1. Other hyperlipidemia  - Comprehensive metabolic panel - Lipid Panel With LDL/HDL Ratio  2. Essential hypertension  - CBC w/Diff/Platelet - Comprehensive metabolic panel  3. Gastroesophageal reflux disease, esophagitis presence not specified  - TSH  4. Arthritis   5. Other allergic rhinitis   6. Class 3 obesity due to excess calories with serious comorbidity and body mass index (BMI) of 40.0 to 44.9 in adult ([place])   7. Overactive bladder   8. Other fatigue  - TSH I have done the exam and reviewed the chart and it is accurate to the best of my knowledge. Development worker, community has  been used and  any errors in dictation or transcription are unintentional. Miguel Aschoff M.D. [facility]

## 2016-05-10 NOTE — Patient Instructions (Signed)
Health Maintenance, Female Introduction Adopting a healthy lifestyle and getting preventive care can go a long way to promote health and wellness. Talk with your health care provider about what schedule of regular examinations is right for you. This is a good chance for you to check in with your provider about disease prevention and staying healthy. In between checkups, there are plenty of things you can do on your own. Experts have done a lot of research about which lifestyle changes and preventive measures are most likely to keep you healthy. Ask your health care provider for more information. Weight and diet Eat a healthy diet  Be sure to include plenty of vegetables, fruits, low-fat dairy products, and lean protein.  Do not eat a lot of foods high in solid fats, added sugars, or salt.  Get regular exercise. This is one of the most important things you can do for your health.  Most adults should exercise for at least 150 minutes each week. The exercise should increase your heart rate and make you sweat (moderate-intensity exercise).  Most adults should also do strengthening exercises at least twice a week. This is in addition to the moderate-intensity exercise. Maintain a healthy weight  Body mass index (BMI) is a measurement that can be used to identify possible weight problems. It estimates body fat based on height and weight. Your health care provider can help determine your BMI and help you achieve or maintain a healthy weight.  For females 4 years of age and older:  A BMI below 18.5 is considered underweight.  A BMI of 18.5 to 24.9 is normal.  A BMI of 25 to 29.9 is considered overweight.  A BMI of 30 and above is considered obese. Watch levels of cholesterol and blood lipids  You should start having your blood tested for lipids and cholesterol at 80 years of age, then have this test every 5 years.  You may need to have your cholesterol levels checked more often  if:  Your lipid or cholesterol levels are high.  You are older than 80 years of age.  You are at high risk for heart disease. Cancer screening Lung Cancer  Lung cancer screening is recommended for adults 12-31 years old who are at high risk for lung cancer because of a history of smoking.  A yearly low-dose CT scan of the lungs is recommended for people who:  Currently smoke.  Have quit within the past 15 years.  Have at least a 30-pack-year history of smoking. A pack year is smoking an average of one pack of cigarettes a day for 1 year.  Yearly screening should continue until it has been 15 years since you quit.  Yearly screening should stop if you develop a health problem that would prevent you from having lung cancer treatment. Breast Cancer  Practice breast self-awareness. This means understanding how your breasts normally appear and feel.  It also means doing regular breast self-exams. Let your health care provider know about any changes, no matter how small.  If you are in your 20s or 30s, you should have a clinical breast exam (CBE) by a health care provider every 1-3 years as part of a regular health exam.  If you are 74 or older, have a CBE every year. Also consider having a breast X-ray (mammogram) every year.  If you have a family history of breast cancer, talk to your health care provider about genetic screening.  If you are at high risk for breast cancer,  talk to your health care provider about having an MRI and a mammogram every year.  Breast cancer gene (BRCA) assessment is recommended for women who have family members with BRCA-related cancers. BRCA-related cancers include:  Breast.  Ovarian.  Tubal.  Peritoneal cancers.  Results of the assessment will determine the need for genetic counseling and BRCA1 and BRCA2 testing. Colorectal Cancer  This type of cancer can be detected and often prevented.  Routine colorectal cancer screening usually begins  at 80 years of age and continues through 80 years of age.  Your health care provider may recommend screening at an earlier age if you have risk factors for colon cancer.  Your health care provider may also recommend using home test kits to check for hidden blood in the stool.  A small camera at the end of a tube can be used to examine your colon directly (sigmoidoscopy or colonoscopy). This is done to check for the earliest forms of colorectal cancer.  Routine screening usually begins at age 50.  Direct examination of the colon should be repeated every 5-10 years through 80 years of age. However, you may need to be screened more often if early forms of precancerous polyps or small growths are found. Skin Cancer  Check your skin from head to toe regularly.  Tell your health care provider about any new moles or changes in moles, especially if there is a change in a mole's shape or color.  Also tell your health care provider if you have a mole that is larger than the size of a pencil eraser.  Always use sunscreen. Apply sunscreen liberally and repeatedly throughout the day.  Protect yourself by wearing long sleeves, pants, a wide-brimmed hat, and sunglasses whenever you are outside. Heart disease, diabetes, and high blood pressure  High blood pressure causes heart disease and increases the risk of stroke. High blood pressure is more likely to develop in:  People who have blood pressure in the high end of the normal range (130-139/85-89 mm Hg).  People who are overweight or obese.  People who are African American.  If you are 18-39 years of age, have your blood pressure checked every 3-5 years. If you are 40 years of age or older, have your blood pressure checked every year. You should have your blood pressure measured twice-once when you are at a hospital or clinic, and once when you are not at a hospital or clinic. Record the average of the two measurements. To check your blood pressure  when you are not at a hospital or clinic, you can use:  An automated blood pressure machine at a pharmacy.  A home blood pressure monitor.  If you are between 55 years and 79 years old, ask your health care provider if you should take aspirin to prevent strokes.  Have regular diabetes screenings. This involves taking a blood sample to check your fasting blood sugar level.  If you are at a normal weight and have a low risk for diabetes, have this test once every three years after 80 years of age.  If you are overweight and have a high risk for diabetes, consider being tested at a younger age or more often. Preventing infection Hepatitis B  If you have a higher risk for hepatitis B, you should be screened for this virus. You are considered at high risk for hepatitis B if:  You were born in a country where hepatitis B is common. Ask your health care provider which countries are   considered high risk.  Your parents were born in a high-risk country, and you have not been immunized against hepatitis B (hepatitis B vaccine).  You have HIV or AIDS.  You use needles to inject street drugs.  You live with someone who has hepatitis B.  You have had sex with someone who has hepatitis B.  You get hemodialysis treatment.  You take certain medicines for conditions, including cancer, organ transplantation, and autoimmune conditions. Hepatitis C  Blood testing is recommended for:  Everyone born from 1945 through 1965.  Anyone with known risk factors for hepatitis C. Osteoporosis and menopause  Osteoporosis is a disease in which the bones lose minerals and strength with aging. This can result in serious bone fractures. Your risk for osteoporosis can be identified using a bone density scan.  If you are 65 years of age or older, or if you are at risk for osteoporosis and fractures, ask your health care provider if you should be screened.  Ask your health care provider whether you should take  a calcium or vitamin D supplement to lower your risk for osteoporosis.  Menopause may have certain physical symptoms and risks.  Hormone replacement therapy may reduce some of these symptoms and risks. Talk to your health care provider about whether hormone replacement therapy is right for you. Follow these instructions at home:  Schedule regular health, dental, and eye exams.  Stay current with your immunizations.  Do not use any tobacco products including cigarettes, chewing tobacco, or electronic cigarettes.  If you are pregnant, do not drink alcohol.  If you are breastfeeding, limit how much and how often you drink alcohol.  Limit alcohol intake to no more than 1 drink per day for nonpregnant women. One drink equals 12 ounces of beer, 5 ounces of wine, or 1 ounces of hard liquor.  Do not use street drugs.  Do not share needles.  Ask your health care provider for help if you need support or information about quitting drugs.  Tell your health care provider if you often feel depressed.  Tell your health care provider if you have ever been abused or do not feel safe at home. This information is not intended to replace advice given to you by your health care provider. Make sure you discuss any questions you have with your health care provider. Document Released: 10/03/2010 Document Revised: 08/26/2015 Document Reviewed: 12/22/2014  2017 Elsevier  

## 2016-05-11 ENCOUNTER — Telehealth: Payer: Self-pay

## 2016-05-11 LAB — TSH: TSH: 1.74 u[IU]/mL (ref 0.450–4.500)

## 2016-05-11 LAB — COMPREHENSIVE METABOLIC PANEL
A/G RATIO: 1.2 (ref 1.2–2.2)
ALT: 12 IU/L (ref 0–32)
AST: 16 IU/L (ref 0–40)
Albumin: 4 g/dL (ref 3.5–4.8)
Alkaline Phosphatase: 83 IU/L (ref 39–117)
BILIRUBIN TOTAL: 0.4 mg/dL (ref 0.0–1.2)
BUN / CREAT RATIO: 18 (ref 12–28)
BUN: 17 mg/dL (ref 8–27)
CHLORIDE: 99 mmol/L (ref 96–106)
CO2: 24 mmol/L (ref 18–29)
Calcium: 9.8 mg/dL (ref 8.7–10.3)
Creatinine, Ser: 0.93 mg/dL (ref 0.57–1.00)
GFR, EST AFRICAN AMERICAN: 68 mL/min/{1.73_m2} (ref >59)
GFR, EST NON AFRICAN AMERICAN: 59 mL/min/{1.73_m2} — AB (ref >59)
GLOBULIN, TOTAL: 3.4 g/dL (ref 1.5–4.5)
Glucose: 99 mg/dL (ref 65–99)
POTASSIUM: 4 mmol/L (ref 3.5–5.2)
SODIUM: 143 mmol/L (ref 134–144)
TOTAL PROTEIN: 7.4 g/dL (ref 6.0–8.5)

## 2016-05-11 LAB — CBC WITH DIFFERENTIAL/PLATELET
BASOS: 1 %
Basophils Absolute: 0.1 10*3/uL (ref 0.0–0.2)
EOS (ABSOLUTE): 0.2 10*3/uL (ref 0.0–0.4)
Eos: 2 %
Hematocrit: 43.3 % (ref 34.0–46.6)
Hemoglobin: 14.2 g/dL (ref 11.1–15.9)
IMMATURE GRANS (ABS): 0 10*3/uL (ref 0.0–0.1)
Immature Granulocytes: 0 %
LYMPHS: 34 %
Lymphocytes Absolute: 2.8 10*3/uL (ref 0.7–3.1)
MCH: 29.5 pg (ref 26.6–33.0)
MCHC: 32.8 g/dL (ref 31.5–35.7)
MCV: 90 fL (ref 79–97)
MONOS ABS: 0.9 10*3/uL (ref 0.1–0.9)
Monocytes: 11 %
NEUTROS ABS: 4.3 10*3/uL (ref 1.4–7.0)
Neutrophils: 52 %
PLATELETS: 306 10*3/uL (ref 150–379)
RBC: 4.82 x10E6/uL (ref 3.77–5.28)
RDW: 13.8 % (ref 12.3–15.4)
WBC: 8.2 10*3/uL (ref 3.4–10.8)

## 2016-05-11 LAB — LIPID PANEL WITH LDL/HDL RATIO
Cholesterol, Total: 229 mg/dL — ABNORMAL HIGH (ref 100–199)
HDL: 57 mg/dL (ref >39)
LDL Calculated: 149 mg/dL — ABNORMAL HIGH (ref 0–99)
LDL/HDL RATIO: 2.6 ratio (ref 0.0–3.2)
Triglycerides: 113 mg/dL (ref 0–149)
VLDL Cholesterol Cal: 23 mg/dL (ref 5–40)

## 2016-05-11 NOTE — Telephone Encounter (Signed)
-----   Message from Jerrol Banana., MD sent at 05/11/2016  9:25 AM EST ----- Labs okay.

## 2016-05-11 NOTE — Telephone Encounter (Signed)
Advised pt of lab results. Pt verbally acknowledges understanding. Leondro Coryell Drozdowski, CMA   

## 2016-05-15 DIAGNOSIS — L820 Inflamed seborrheic keratosis: Secondary | ICD-10-CM | POA: Diagnosis not present

## 2016-05-15 DIAGNOSIS — B078 Other viral warts: Secondary | ICD-10-CM | POA: Diagnosis not present

## 2016-05-15 DIAGNOSIS — R208 Other disturbances of skin sensation: Secondary | ICD-10-CM | POA: Diagnosis not present

## 2016-06-07 ENCOUNTER — Other Ambulatory Visit: Payer: Self-pay | Admitting: Family Medicine

## 2016-06-07 ENCOUNTER — Other Ambulatory Visit: Payer: Self-pay | Admitting: Internal Medicine

## 2016-06-07 DIAGNOSIS — Z853 Personal history of malignant neoplasm of breast: Secondary | ICD-10-CM

## 2016-08-11 DIAGNOSIS — J340 Abscess, furuncle and carbuncle of nose: Secondary | ICD-10-CM | POA: Diagnosis not present

## 2016-08-11 DIAGNOSIS — H6063 Unspecified chronic otitis externa, bilateral: Secondary | ICD-10-CM | POA: Diagnosis not present

## 2016-08-11 DIAGNOSIS — J301 Allergic rhinitis due to pollen: Secondary | ICD-10-CM | POA: Diagnosis not present

## 2016-08-30 DIAGNOSIS — H6060 Unspecified chronic otitis externa, unspecified ear: Secondary | ICD-10-CM | POA: Diagnosis not present

## 2016-08-30 DIAGNOSIS — H6980 Other specified disorders of Eustachian tube, unspecified ear: Secondary | ICD-10-CM | POA: Diagnosis not present

## 2016-08-30 DIAGNOSIS — J301 Allergic rhinitis due to pollen: Secondary | ICD-10-CM | POA: Diagnosis not present

## 2016-09-14 ENCOUNTER — Encounter: Payer: Self-pay | Admitting: Family Medicine

## 2016-09-14 ENCOUNTER — Ambulatory Visit (INDEPENDENT_AMBULATORY_CARE_PROVIDER_SITE_OTHER): Payer: Medicare Other | Admitting: Family Medicine

## 2016-09-14 VITALS — BP 110/64 | HR 85 | Temp 98.9°F | Resp 16 | Wt 271.2 lb

## 2016-09-14 DIAGNOSIS — R600 Localized edema: Secondary | ICD-10-CM | POA: Diagnosis not present

## 2016-09-14 DIAGNOSIS — H6981 Other specified disorders of Eustachian tube, right ear: Secondary | ICD-10-CM

## 2016-09-14 MED ORDER — FUROSEMIDE 20 MG PO TABS: 20.0000 mg | ORAL_TABLET | Freq: Every day | ORAL | 0 refills | Status: DC

## 2016-09-14 NOTE — Progress Notes (Signed)
Subjective:     Patient ID: Lisa Paul, female   DOB: 12-17-1934, 82 y.o.   MRN: [id number]  HPI  Chief Complaint  Patient presents with  . Leg Swelling    Patient comes in office today with complaints of swelling of her ankles and feet for the past 7 days.   . Ear Drainage    Patient reports that she has been picking in her ears because she believes there is something in it. Son who is present with patient today states that it looks like buldge in ear.   Son accompanies and states she has been compulsively manipulating her right ear due to feeling of fluid inside. Has been treated by ENT with both topical aural medication and patient states ear canal feels better but she continues to feel sensation of pressure inside. Has been intermittently taking Ditropan and her incontinence is an issue with her son due to hygiene/laundry issues. He is also concerned about her memory. Ankle/foot swelling has been present for 7 days. Reports she is not active at home. Last GFR 05/11/16 was 59.   Review of Systems     Objective:   Physical Exam  Constitutional: She appears well-developed and well-nourished. No distress.  HENT:  Bilateral ear canals patent. TM's intact without inflammation. Small erosion in anterior vestibule of right ear  Cardiovascular: Normal rate and regular rhythm.   Pulmonary/Chest: Breath sounds normal.  Musculoskeletal: She exhibits edema (2+ of ankles and feet).       Assessment:    1. Eustachian tube dysfunction, right  2. Pedal edema - furosemide (LASIX) 20 MG tablet; Take 1 tablet (20 mg total) by mouth daily.  Dispense: 7 tablet; Refill: 0    Plan:    Continue steroid nasal spray. Stop ear medication and leave right ear alone.Continue Ditropan. Suggested to son she f/u with Dr.Gilbert regarding memory issues.

## 2016-09-14 NOTE — Patient Instructions (Addendum)
Continue Flonase steroid nasal spray. Stop placing medication in or manipulating your right ear. Take fluid pill in the AM. Let us know if not improving.

## 2016-09-26 ENCOUNTER — Ambulatory Visit (INDEPENDENT_AMBULATORY_CARE_PROVIDER_SITE_OTHER): Payer: Medicare Other | Admitting: Family Medicine

## 2016-09-26 VITALS — BP 118/60 | HR 82 | Resp 16 | Wt 265.0 lb

## 2016-09-26 DIAGNOSIS — R600 Localized edema: Secondary | ICD-10-CM

## 2016-09-26 DIAGNOSIS — Z79899 Other long term (current) drug therapy: Secondary | ICD-10-CM

## 2016-09-26 DIAGNOSIS — Z6841 Body Mass Index (BMI) 40.0 and over, adult: Secondary | ICD-10-CM | POA: Diagnosis not present

## 2016-09-26 DIAGNOSIS — N3281 Overactive bladder: Secondary | ICD-10-CM

## 2016-09-26 DIAGNOSIS — M1990 Unspecified osteoarthritis, unspecified site: Secondary | ICD-10-CM

## 2016-09-26 DIAGNOSIS — Z853 Personal history of malignant neoplasm of breast: Secondary | ICD-10-CM | POA: Diagnosis not present

## 2016-09-26 DIAGNOSIS — K219 Gastro-esophageal reflux disease without esophagitis: Secondary | ICD-10-CM | POA: Diagnosis not present

## 2016-09-26 DIAGNOSIS — IMO0001 Reserved for inherently not codable concepts without codable children: Secondary | ICD-10-CM

## 2016-09-26 DIAGNOSIS — H60541 Acute eczematoid otitis externa, right ear: Secondary | ICD-10-CM

## 2016-09-26 DIAGNOSIS — E6609 Other obesity due to excess calories: Secondary | ICD-10-CM

## 2016-09-26 DIAGNOSIS — J3089 Other allergic rhinitis: Secondary | ICD-10-CM | POA: Diagnosis not present

## 2016-09-26 DIAGNOSIS — F3289 Other specified depressive episodes: Secondary | ICD-10-CM | POA: Diagnosis not present

## 2016-09-26 DIAGNOSIS — I10 Essential (primary) hypertension: Secondary | ICD-10-CM | POA: Diagnosis not present

## 2016-09-26 MED ORDER — DULOXETINE HCL 20 MG PO CPEP: 20.0000 mg | ORAL_CAPSULE | Freq: Every day | ORAL | 12 refills | Status: DC

## 2016-09-26 NOTE — Progress Notes (Signed)
Lisa Paul  MRN: [id number] DOB: 1935-01-30  Subjective:  HPI  Patient is here to follow up on edema. Patient saw Simona Huh on 09/14/16 and was started on Lasix for 7 days to help edema, this is better and edema is resolved in the morning when she wakes up. Patient's son is here to also discuss patient's tendencies of OCD-such as washing her face last night over and over, messing with her ears, nose over and over. This has been going on for months. One of her sons is with her describing actions of other son regarding legal issues regarding POA for deceased father and pt. Depression screen [facility] 2/9 09/26/2016 05/10/2016 10/14/2015  Decreased Interest 2 1 0  Down, Depressed, Hopeless 1 2 1   PHQ - 2 Score 3 3 1   Altered sleeping 3 2 1   Tired, decreased energy 2 0 2  Change in appetite 1 0 1  Feeling bad or failure about yourself  0 0 0  Trouble concentrating 0 0 1  Moving slowly or fidgety/restless 1 1 1   Suicidal thoughts 0 1 0  PHQ-9 Score 10 7 7   Difficult doing work/chores - Somewhat difficult Somewhat difficult    Patient Active Problem List   Diagnosis Date Noted  . Allergic rhinitis 09/10/2014  . Arthritis 09/10/2014  . Breast cancer ([place]) 09/10/2014  . Chronic infection of sinus 09/10/2014  . Acid reflux 09/10/2014  . Auditory impairment 09/10/2014  . BP (high blood pressure) 09/10/2014  . Extreme obesity 09/10/2014  . Arthritis of knee, degenerative 09/10/2014  . Arthritis, degenerative 09/10/2014  . Need for prophylactic hormone replacement therapy (postmenopausal) 09/10/2014  . Restless legs syndrome 09/10/2014  . Apnea, sleep 09/10/2014  . Avitaminosis D 09/10/2014  . History of breast cancer 10/23/2012    Past Medical History:  Diagnosis Date  . Allergy   . Arthritis 1993  . Cancer [facility]) 2012   Left Breast with lumpectomy and mammosite  . Cataract   . Colon polyp 2005  . GERD (gastroesophageal reflux disease)   . Hypertension 15 years  . Malignant  neoplasm of breast (female), unspecified site    left breast    Social History   Social History  . Marital status: Married    Spouse name: N/A  . Number of children: N/A  . Years of education: N/A   Occupational History  . Not on file.   Social History Main Topics  . Smoking status: Never Smoker  . Smokeless tobacco: Never Used  . Alcohol use No  . Drug use: No  . Sexual activity: No   Other Topics Concern  . Not on file   Social History Narrative  . No narrative on file    Outpatient Encounter Prescriptions as of 09/26/2016  Medication Sig Note  . aspirin 81 MG tablet Take 81 mg by mouth daily.    Marland Kitchen aspirin-sod bicarb-citric acid (ALKA-SELTZER) 325 MG TBEF tablet Take 325 mg by mouth every 6 (six) hours as needed.   Marland Kitchen azelastine (ASTELIN) 0.1 % nasal spray USE 1-2 SPRAYS IN EACH NOSTRIL TWICE DAILY   . clotrimazole-betamethasone (LOTRISONE) cream APPLY TO AFFECTED EAR ONCE OR TWICE WEEKLY AS NEEDED FOR SKIN IRRITATION   . fluticasone (FLONASE) 50 MCG/ACT nasal spray Place 2 sprays into both nostrils daily.   . furosemide (LASIX) 20 MG tablet Take 1 tablet (20 mg total) by mouth daily.   Marland Kitchen gentamicin ointment (GARAMYCIN) 0.1 % APPLY SMALL AMOUNT TO EACH NOSTRIL 2-3 TIMES DAILY FOR  10 DAYS. MAY REPEAT AS NEEDED.   Marland Kitchen loratadine (CLARITIN) 10 MG tablet Take 10 mg by mouth daily.   Marland Kitchen losartan-hydrochlorothiazide (HYZAAR) 100-25 MG tablet TAKE 1 TABLET BY MOUTH EVERY DAY   . meloxicam (MOBIC) 7.5 MG tablet TAKE 1 TABLET (7.5 MG TOTAL) BY MOUTH DAILY. (Patient taking differently: Take 7.5 mg by mouth daily. TAKE 1 TABLET (7.5 MG TOTAL) BY MOUTH DAILY.)   . oxybutynin (DITROPAN) 5 MG tablet Take 1 tablet (5 mg total) by mouth 2 (two) times daily.   . ranitidine (ZANTAC) 150 MG tablet Take 1 tablet (150 mg total) by mouth at bedtime.   . [DISCONTINUED] fexofenadine (ALLEGRA ALLERGY) 180 MG tablet Take by mouth. Reported on 10/14/2015 09/10/2014: Received from: [facility]  . [DISCONTINUED] MULTIPLE VITAMIN PO Take by mouth. 09/10/2014: Received from: [facility]   No facility-administered encounter medications on file as of 09/26/2016.     Allergies  Allergen Reactions  . Tape     Paper tape - blisters    Review of Systems  Constitutional: Positive for malaise/fatigue.  HENT: Positive for ear discharge and hearing loss.   Respiratory: Negative.   Cardiovascular: Positive for leg swelling. Negative for chest pain and palpitations.  Gastrointestinal: Positive for heartburn (sometimes).  Genitourinary: Positive for frequency and urgency.       Incontinence  Musculoskeletal: Positive for back pain and joint pain.       Stiffness  Neurological: Positive for dizziness.  Psychiatric/Behavioral: Positive for depression. The patient has insomnia.     Objective:  BP 118/60   Pulse 82   Resp 16   Wt 265 lb (120.2 kg)   BMI 42.77 kg/m   Physical Exam  Constitutional: She is oriented to person, place, and time and well-developed, well-nourished, and in no distress.  HENT:  Head: Normocephalic and atraumatic.  Right Ear: External ear normal.  Left Ear: External ear normal.  Nose: Nose normal.  Mouth/Throat: Oropharynx is clear and moist.  Eczema right ear  Eyes: Conjunctivae are normal. Pupils are equal, round, and reactive to light.  Neck: Normal range of motion. Neck supple.  Cardiovascular: Normal rate, regular rhythm, normal heart sounds and intact distal pulses.  Exam reveals no gallop.   No murmur heard. Pulmonary/Chest: Effort normal and breath sounds normal. No respiratory distress. She has no wheezes.  Abdominal: Soft.  Musculoskeletal: She exhibits edema (trace).  Neurological: She is alert and oriented to person, place, and time.  Skin: Skin is warm and dry.  Psychiatric: Mood and affect normal.   Assessment and Plan :  1. Pedal edema Stable. Discussed with patient and son that the long term risk of taking  Lasix can be an issue, will follow, edema stable at this time.  2. Gastroesophageal reflux disease, esophagitis presence not specified 3. Other allergic rhinitis  4. Class 3 obesity due to excess calories with serious comorbidity and body mass index (BMI) of 40.0 to 44.9 in adult ([place])  5. Medication management Also discussed with patient and her son to decide on getting POA for financial and health issues. - Ambulatory referral to [facility]  6. Essential hypertension 7. Arthritis  8. History of breast cancer 9. Eczema of right external ear Discussed this with patient and her son.  10. Other depression PHQ 9 score is 10 today. Start Cymbalta. Refer to Psychiatry, follow up 1 month.It is possible pt has OCD or other psychiatric issues. I do not think that she trusts either of  her sons. - Ambulatory referral to [facility] - Ambulatory referral to Psychiatry - DULoxetine (CYMBALTA) 20 MG capsule; Take 1 capsule (20 mg total) by mouth daily.  Dispense: 30 capsule; Refill: 12 No overt cognitive issues as she scores 29/30 on MMSE.She might benefit from Geriatric referral. 11. Overactive bladder Refer to urologist.  - Ambulatory referral to Urology  HPI, Exam and A&P transcribed by Theressa Millard, RMA under direction and in the presence of Miguel Aschoff, MD. I have done the exam and reviewed the chart and it is accurate to the best of my knowledge. Development worker, community has been used and  any errors in dictation or transcription are unintentional. Miguel Aschoff M.D. [facility]

## 2016-09-29 ENCOUNTER — Telehealth: Payer: Self-pay | Admitting: Family Medicine

## 2016-09-29 DIAGNOSIS — H9190 Unspecified hearing loss, unspecified ear: Secondary | ICD-10-CM | POA: Diagnosis not present

## 2016-09-29 DIAGNOSIS — J309 Allergic rhinitis, unspecified: Secondary | ICD-10-CM | POA: Diagnosis not present

## 2016-09-29 DIAGNOSIS — G2581 Restless legs syndrome: Secondary | ICD-10-CM | POA: Diagnosis not present

## 2016-09-29 DIAGNOSIS — F3289 Other specified depressive episodes: Secondary | ICD-10-CM | POA: Diagnosis not present

## 2016-09-29 DIAGNOSIS — E669 Obesity, unspecified: Secondary | ICD-10-CM | POA: Diagnosis not present

## 2016-09-29 DIAGNOSIS — K219 Gastro-esophageal reflux disease without esophagitis: Secondary | ICD-10-CM | POA: Diagnosis not present

## 2016-09-29 DIAGNOSIS — R32 Unspecified urinary incontinence: Secondary | ICD-10-CM | POA: Diagnosis not present

## 2016-09-29 DIAGNOSIS — Z6841 Body Mass Index (BMI) 40.0 and over, adult: Secondary | ICD-10-CM | POA: Diagnosis not present

## 2016-09-29 DIAGNOSIS — Z853 Personal history of malignant neoplasm of breast: Secondary | ICD-10-CM | POA: Diagnosis not present

## 2016-09-29 DIAGNOSIS — G4733 Obstructive sleep apnea (adult) (pediatric): Secondary | ICD-10-CM | POA: Diagnosis not present

## 2016-09-29 DIAGNOSIS — H6991 Unspecified Eustachian tube disorder, right ear: Secondary | ICD-10-CM | POA: Diagnosis not present

## 2016-09-29 DIAGNOSIS — R609 Edema, unspecified: Secondary | ICD-10-CM | POA: Diagnosis not present

## 2016-09-29 DIAGNOSIS — I10 Essential (primary) hypertension: Secondary | ICD-10-CM | POA: Diagnosis not present

## 2016-09-29 DIAGNOSIS — M1990 Unspecified osteoarthritis, unspecified site: Secondary | ICD-10-CM | POA: Diagnosis not present

## 2016-09-29 NOTE — Telephone Encounter (Signed)
ok 

## 2016-09-29 NOTE — Telephone Encounter (Signed)
Lisa Paul with [facility] called needing PT and OT therapy for Eval for Gait and endurance.  Pt wants to see a Education officer, museum for community resources in her area.  Nursing will see her 2 times a week for 2 weeks and then 1 time a week for 5 weeks for Medication mgmt.  They will be following up with a fax regarding these request.  Thanks teri

## 2016-09-29 NOTE — Telephone Encounter (Signed)
Cynthia advised-aa 

## 2016-09-29 NOTE — Telephone Encounter (Signed)
See below-aa 

## 2016-10-05 DIAGNOSIS — R609 Edema, unspecified: Secondary | ICD-10-CM | POA: Diagnosis not present

## 2016-10-05 DIAGNOSIS — M1990 Unspecified osteoarthritis, unspecified site: Secondary | ICD-10-CM | POA: Diagnosis not present

## 2016-10-05 DIAGNOSIS — I10 Essential (primary) hypertension: Secondary | ICD-10-CM | POA: Diagnosis not present

## 2016-10-05 DIAGNOSIS — G4733 Obstructive sleep apnea (adult) (pediatric): Secondary | ICD-10-CM | POA: Diagnosis not present

## 2016-10-05 DIAGNOSIS — F3289 Other specified depressive episodes: Secondary | ICD-10-CM | POA: Diagnosis not present

## 2016-10-05 DIAGNOSIS — H6991 Unspecified Eustachian tube disorder, right ear: Secondary | ICD-10-CM | POA: Diagnosis not present

## 2016-10-06 DIAGNOSIS — I10 Essential (primary) hypertension: Secondary | ICD-10-CM | POA: Diagnosis not present

## 2016-10-06 DIAGNOSIS — M1990 Unspecified osteoarthritis, unspecified site: Secondary | ICD-10-CM | POA: Diagnosis not present

## 2016-10-06 DIAGNOSIS — H6991 Unspecified Eustachian tube disorder, right ear: Secondary | ICD-10-CM | POA: Diagnosis not present

## 2016-10-06 DIAGNOSIS — F3289 Other specified depressive episodes: Secondary | ICD-10-CM | POA: Diagnosis not present

## 2016-10-06 DIAGNOSIS — G4733 Obstructive sleep apnea (adult) (pediatric): Secondary | ICD-10-CM | POA: Diagnosis not present

## 2016-10-06 DIAGNOSIS — R609 Edema, unspecified: Secondary | ICD-10-CM | POA: Diagnosis not present

## 2016-10-17 DIAGNOSIS — F3289 Other specified depressive episodes: Secondary | ICD-10-CM | POA: Diagnosis not present

## 2016-10-17 DIAGNOSIS — I10 Essential (primary) hypertension: Secondary | ICD-10-CM | POA: Diagnosis not present

## 2016-10-17 DIAGNOSIS — R609 Edema, unspecified: Secondary | ICD-10-CM | POA: Diagnosis not present

## 2016-10-17 DIAGNOSIS — M1990 Unspecified osteoarthritis, unspecified site: Secondary | ICD-10-CM | POA: Diagnosis not present

## 2016-10-17 DIAGNOSIS — G4733 Obstructive sleep apnea (adult) (pediatric): Secondary | ICD-10-CM | POA: Diagnosis not present

## 2016-10-17 DIAGNOSIS — H6991 Unspecified Eustachian tube disorder, right ear: Secondary | ICD-10-CM | POA: Diagnosis not present

## 2016-10-17 NOTE — Progress Notes (Signed)
10/18/2016 3:25 PM   Lisa Paul May 17, 1934 [id number]  Referring provider: Jerrol Banana., MD [address] [place], [place] [address]  Chief Complaint  Patient presents with  . Over Active Bladder   HPI: Patient is a 82 year old Caucasian female who is referred to [place] by, Dr. Rosanna Randy , for overactive bladder.    Patient states that she has had urinary incontinence for a year or so.  Patient has incontinence with urge and stress.   She is experiencing no incontinent episodes during the day.  She is experiencing no incontinent episodes during the night.  This is on oxybutynin.  Her son states this is not the case.    Her incontinence volume is zero.   She is wearing no pads/depends daily.    She is having associated urinary frequency, urgency and nocturia.  She does not have a history of urinary tract infections, STI's or injury to the bladder.   She denies dysuria, gross hematuria, suprapubic pain, back pain, abdominal pain or flank pain.  She has not had any recent fevers, chills, nausea or vomiting.  She does not have a history of nephrolithiasis, GU surgery or GU trauma.   She is post menopausal.  She admits to constipation.  She is not having pain with bladder filling.    She has not had any recent imaging studies.    She is drinking 1 to 3 caffeinated beverages daily.     Her risk factors for incontinence are obesity, vaginal delivery, a family history of incontinence, age, caffeine, depression, vaginal atrophy and pelvic surgery.     She is taking antihistamines, OAB medication and antidepressants.      PMH: Past Medical History:  Diagnosis Date  . Allergy   . Arthritis 1993  . Cancer [facility]) 2012   Left Breast with lumpectomy and mammosite  . Cataract   . Colon polyp 2005  . GERD (gastroesophageal reflux disease)   . Hypertension 15 years  . Malignant neoplasm of breast (female), unspecified site    left breast    Surgical History: Past  Surgical History:  Procedure Laterality Date  . ABDOMINAL HYSTERECTOMY  30 years ago  . BREAST BIOPSY Left 2012   invasive mammary carcinoma  . BREAST LUMPECTOMY Left 2012  . CARPAL TUNNEL RELEASE Bilateral   . COLONOSCOPY  05/2003   Dr Nicolasa Ducking [facility]  . COLONOSCOPY  07/24/12  . EYE SURGERY Bilateral 2005   cataract  . JOINT REPLACEMENT Bilateral 2007   knees  . mammosite balloon placement Left 08/2010   Removal 09/2010  . TONSILLECTOMY  age 52    Home Medications:  Allergies as of 10/18/2016      Reactions   Tape    Paper tape - blisters      Medication List       Accurate as of 10/18/16  3:25 PM. Always use your most recent med list.          aspirin 81 MG tablet Take 81 mg by mouth daily.   aspirin-sod bicarb-citric acid 325 MG Tbef tablet Commonly known as:  ALKA-SELTZER Take 325 mg by mouth every 6 (six) hours as needed.   azelastine 0.1 % nasal spray Commonly known as:  ASTELIN USE 1-2 SPRAYS IN EACH NOSTRIL TWICE DAILY   clotrimazole-betamethasone cream Commonly known as:  LOTRISONE APPLY TO AFFECTED EAR ONCE OR TWICE WEEKLY AS NEEDED FOR SKIN IRRITATION   DULoxetine 20 MG capsule Commonly known as:  CYMBALTA Take 1 capsule (20 mg total) by mouth daily.   fluticasone 50 MCG/ACT nasal spray Commonly known as:  FLONASE Place 2 sprays into both nostrils daily.   furosemide 20 MG tablet Commonly known as:  LASIX Take 1 tablet (20 mg total) by mouth daily.   gentamicin ointment 0.1 % Commonly known as:  GARAMYCIN APPLY SMALL AMOUNT TO EACH NOSTRIL 2-3 TIMES DAILY FOR 10 DAYS. MAY REPEAT AS NEEDED.   loratadine 10 MG tablet Commonly known as:  CLARITIN Take 10 mg by mouth daily.   losartan-hydrochlorothiazide 100-25 MG tablet Commonly known as:  HYZAAR TAKE 1 TABLET BY MOUTH EVERY DAY   meloxicam 7.5 MG tablet Commonly known as:  MOBIC TAKE 1 TABLET (7.5 MG TOTAL) BY MOUTH DAILY.   mirabegron ER 50 MG Tb24 tablet Commonly known as:   MYRBETRIQ Take 1 tablet (50 mg total) by mouth daily.   oxybutynin 5 MG tablet Commonly known as:  DITROPAN Take 1 tablet (5 mg total) by mouth 2 (two) times daily.   ranitidine 150 MG tablet Commonly known as:  ZANTAC Take 1 tablet (150 mg total) by mouth at bedtime.       Allergies:  Allergies  Allergen Reactions  . Tape     Paper tape - blisters    Family History: Family History  Problem Relation Age of Onset  . Colon cancer Mother   . Stroke Brother        possibly. Pt is unsure.  . Prostate cancer Neg Hx   . Bladder Cancer Neg Hx   . Kidney cancer Neg Hx     Social History:  reports that she has never smoked. She has never used smokeless tobacco. She reports that she does not drink alcohol or use drugs.  ROS: UROLOGY Frequent Urination?: Yes Hard to postpone urination?: Yes Burning/pain with urination?: No Get up at night to urinate?: Yes Leakage of urine?: Yes Urine stream starts and stops?: No Trouble starting stream?: No Do you have to strain to urinate?: No Blood in urine?: No Urinary tract infection?: No Sexually transmitted disease?: No Injury to kidneys or bladder?: No Painful intercourse?: No Weak stream?: No Currently pregnant?: No Vaginal bleeding?: No Last menstrual period?: n  Gastrointestinal Nausea?: No Vomiting?: No Indigestion/heartburn?: Yes Diarrhea?: No Constipation?: Yes  Constitutional Fever: No Night sweats?: No Weight loss?: No Fatigue?: Yes  Skin Skin rash/lesions?: Yes Itching?: No  Eyes Blurred vision?: No Double vision?: No  Ears/Nose/Throat Sore throat?: No Sinus problems?: No  Hematologic/Lymphatic Swollen glands?: Yes Easy bruising?: No  Cardiovascular Leg swelling?: Yes Chest pain?: Yes  Respiratory Cough?: No Shortness of breath?: Yes  Endocrine Excessive thirst?: No  Musculoskeletal Back pain?: Yes Joint pain?: Yes  Neurological Headaches?: No Dizziness?:  No  Psychologic Depression?: Yes Anxiety?: Yes  Physical Exam: BP 112/70 (BP Location: Left Arm, Patient Position: Sitting, Cuff Size: Large)   Pulse 97   Ht 5\' 6"  (1.676 m)   Wt 259 lb 9.6 oz (117.8 kg)   BMI 41.90 kg/m   Constitutional: Well nourished. Alert and oriented, No acute distress. HEENT: [place] AT, moist mucus membranes. Trachea midline, no masses. Cardiovascular: No clubbing, cyanosis, or edema. Respiratory: Normal respiratory effort, no increased work of breathing. GI: Abdomen is soft, non tender, non distended, no abdominal masses. Liver and spleen not palpable.  No hernias appreciated.  Stool sample for occult testing is not indicated.   GU: No CVA tenderness.  No bladder fullness or masses.  Atrophic external  genitalia, normal pubic hair distribution, no lesions.  Normal urethral meatus, no lesions, no prolapse, no discharge.   No urethral masses, tenderness and/or tenderness. No bladder fullness, tenderness or masses. Pale vagina mucosa, poor estrogen effect, no discharge, no lesions, good pelvic support, no cystocele or rectocele noted.  Cervix, uterus and adnexa are surgically absent.  Anus and perineum are without rashes or lesions.    Skin: No rashes, bruises or suspicious lesions. Lymph: No cervical or inguinal adenopathy. Neurologic: Grossly intact, no focal deficits, moving all 4 extremities. Psychiatric: Normal mood and affect.  Laboratory Data: Lab Results  Component Value Date   WBC 8.2 05/10/2016   HGB 14.2 05/10/2016   HCT 43.3 05/10/2016   MCV 90 05/10/2016   PLT 306 05/10/2016    Lab Results  Component Value Date   CREATININE 0.93 05/10/2016    Lab Results  Component Value Date   TSH 1.740 05/10/2016       Component Value Date/Time   CHOL 229 (H) 05/10/2016 1448   HDL 57 05/10/2016 1448   LDLCALC 149 (H) 05/10/2016 1448    Lab Results  Component Value Date   AST 16 05/10/2016   Lab Results  Component Value Date   ALT 12 05/10/2016    I have independently reviewed the labs.  Pertinent Imaging: Results for LUVINA, POIRIER (MRN [id number]) as of 10/18/2016 15:44  Ref. Range 10/18/2016 14:59  Scan Result Unknown 70     Assessment & Plan:    1. Mixed incontinence  - patient is having memory issues so we will have a trial of Myrbetriq.  Given Myrbetriq 25 mg samples, #28.  I have reviewed with the patient of the side effects of Myrbetriq, such as: elevation in BP, urinary retention and/or HA.    - stop oxybutynin  - RTC in 3 weeks for PVR and symptom recheck   2. Vaginal atrophy  -  Do not think patient will be able to apply the cream as directed due to memory issues      Return in about 3 weeks (around 11/08/2016) for PVR and OAB questionnaire.  These notes generated with voice recognition software. I apologize for typographical errors.  Zara Council, [facility] [address] [place], [place] [address] [phone]

## 2016-10-18 ENCOUNTER — Telehealth: Payer: Self-pay | Admitting: Family Medicine

## 2016-10-18 ENCOUNTER — Encounter: Payer: Self-pay | Admitting: Urology

## 2016-10-18 ENCOUNTER — Ambulatory Visit (INDEPENDENT_AMBULATORY_CARE_PROVIDER_SITE_OTHER): Payer: Medicare Other | Admitting: Urology

## 2016-10-18 VITALS — BP 112/70 | HR 97 | Ht 66.0 in | Wt 259.6 lb

## 2016-10-18 DIAGNOSIS — N3281 Overactive bladder: Secondary | ICD-10-CM | POA: Diagnosis not present

## 2016-10-18 DIAGNOSIS — N952 Postmenopausal atrophic vaginitis: Secondary | ICD-10-CM

## 2016-10-18 DIAGNOSIS — N3941 Urge incontinence: Secondary | ICD-10-CM | POA: Diagnosis not present

## 2016-10-18 LAB — BLADDER SCAN AMB NON-IMAGING: SCAN RESULT: 70

## 2016-10-18 MED ORDER — MIRABEGRON ER 50 MG PO TB24: 50.0000 mg | ORAL_TABLET | Freq: Every day | ORAL | 3 refills | Status: DC

## 2016-10-18 NOTE — Telephone Encounter (Signed)
Please advise. Thanks.  

## 2016-10-18 NOTE — Telephone Encounter (Signed)
Lisa Paul with [facility] left voicemail requesting verbal orders for in home physical therapy as follows: Once a week for one week Twice a week for 6 weeks Please advise. Thanks TNP

## 2016-10-18 NOTE — Telephone Encounter (Signed)
Adele Schilder advised on the voicemail-aa

## 2016-10-18 NOTE — Telephone Encounter (Signed)
ok 

## 2016-10-20 DIAGNOSIS — R609 Edema, unspecified: Secondary | ICD-10-CM | POA: Diagnosis not present

## 2016-10-20 DIAGNOSIS — I10 Essential (primary) hypertension: Secondary | ICD-10-CM | POA: Diagnosis not present

## 2016-10-20 DIAGNOSIS — F3289 Other specified depressive episodes: Secondary | ICD-10-CM | POA: Diagnosis not present

## 2016-10-20 DIAGNOSIS — M1990 Unspecified osteoarthritis, unspecified site: Secondary | ICD-10-CM | POA: Diagnosis not present

## 2016-10-20 DIAGNOSIS — G4733 Obstructive sleep apnea (adult) (pediatric): Secondary | ICD-10-CM | POA: Diagnosis not present

## 2016-10-20 DIAGNOSIS — H6991 Unspecified Eustachian tube disorder, right ear: Secondary | ICD-10-CM | POA: Diagnosis not present

## 2016-10-23 DIAGNOSIS — F3289 Other specified depressive episodes: Secondary | ICD-10-CM | POA: Diagnosis not present

## 2016-10-23 DIAGNOSIS — I10 Essential (primary) hypertension: Secondary | ICD-10-CM | POA: Diagnosis not present

## 2016-10-23 DIAGNOSIS — H6991 Unspecified Eustachian tube disorder, right ear: Secondary | ICD-10-CM | POA: Diagnosis not present

## 2016-10-23 DIAGNOSIS — M1990 Unspecified osteoarthritis, unspecified site: Secondary | ICD-10-CM | POA: Diagnosis not present

## 2016-10-23 DIAGNOSIS — R609 Edema, unspecified: Secondary | ICD-10-CM | POA: Diagnosis not present

## 2016-10-23 DIAGNOSIS — G4733 Obstructive sleep apnea (adult) (pediatric): Secondary | ICD-10-CM | POA: Diagnosis not present

## 2016-10-26 ENCOUNTER — Ambulatory Visit (INDEPENDENT_AMBULATORY_CARE_PROVIDER_SITE_OTHER): Payer: Medicare Other | Admitting: Family Medicine

## 2016-10-26 VITALS — BP 118/62 | HR 88 | Temp 97.3°F | Resp 16 | Wt 262.0 lb

## 2016-10-26 DIAGNOSIS — Z79899 Other long term (current) drug therapy: Secondary | ICD-10-CM

## 2016-10-26 DIAGNOSIS — F329 Major depressive disorder, single episode, unspecified: Secondary | ICD-10-CM | POA: Diagnosis not present

## 2016-10-26 DIAGNOSIS — N3281 Overactive bladder: Secondary | ICD-10-CM | POA: Diagnosis not present

## 2016-10-26 DIAGNOSIS — F32A Depression, unspecified: Secondary | ICD-10-CM

## 2016-10-26 DIAGNOSIS — J301 Allergic rhinitis due to pollen: Secondary | ICD-10-CM

## 2016-10-26 NOTE — Progress Notes (Signed)
Patient: Lisa Paul Female    DOB: 10-Jan-1935   82 y.o.   MRN: [id number] Visit Date: 10/26/2016  Today's Provider: Wilhemena Durie, MD   Chief Complaint  Patient presents with  . Depression   Subjective:    HPI     Depression, Follow-up  She  was last seen for this 1 months ago. Changes made at last visit include adding Cymbalta.   She reports good compliance with treatment. She is not having side effects.   She reports good tolerance of treatment. Current symptoms include: depressed mood, fatigue and feelings of worthlessness/guilt She feels she is unchanged since last visit, though her pain is improved on the medication.  Depression screen [facility] 2/9 10/26/2016 09/26/2016 05/10/2016  Decreased Interest 1 2 1   Down, Depressed, Hopeless 1 1 2   PHQ - 2 Score 2 3 3   Altered sleeping 2 3 2   Tired, decreased energy 1 2 0  Change in appetite 0 1 0  Feeling bad or failure about yourself  0 0 0  Trouble concentrating 0 0 0  Moving slowly or fidgety/restless 1 1 1   Suicidal thoughts 0 0 1  PHQ-9 Score 6 10 7   Difficult doing work/chores Somewhat difficult - Somewhat difficult   ------------------------------------------------------------------------  Pt recently saw urology for OAB, who changed pt's Oxybutynin to Myrbetriq, with relief if sx.  Allergies  Allergen Reactions  . Tape     Paper tape - blisters     Current Outpatient Prescriptions:  .  aspirin-sod bicarb-citric acid (ALKA-SELTZER) 325 MG TBEF tablet, Take 325 mg by mouth every 6 (six) hours as needed., Disp: , Rfl:  .  DULoxetine (CYMBALTA) 20 MG capsule, Take 1 capsule (20 mg total) by mouth daily., Disp: 30 capsule, Rfl: 12 .  fluticasone (FLONASE) 50 MCG/ACT nasal spray, Place 2 sprays into both nostrils daily., Disp: 16 g, Rfl: 12 .  gentamicin ointment (GARAMYCIN) 0.1 %, APPLY SMALL AMOUNT TO EACH NOSTRIL 2-3 TIMES DAILY FOR 10 DAYS. MAY REPEAT AS NEEDED., Disp: , Rfl: 2 .   losartan-hydrochlorothiazide (HYZAAR) 100-25 MG tablet, TAKE 1 TABLET BY MOUTH EVERY DAY, Disp: 90 tablet, Rfl: 3 .  meloxicam (MOBIC) 7.5 MG tablet, TAKE 1 TABLET (7.5 MG TOTAL) BY MOUTH DAILY. (Patient taking differently: Take 7.5 mg by mouth daily. TAKE 1 TABLET (7.5 MG TOTAL) BY MOUTH DAILY.), Disp: 30 tablet, Rfl: 5 .  mirabegron ER (MYRBETRIQ) 50 MG TB24 tablet, Take 1 tablet (50 mg total) by mouth daily., Disp: 90 tablet, Rfl: 3 .  ranitidine (ZANTAC) 150 MG tablet, Take 1 tablet (150 mg total) by mouth at bedtime., Disp: 30 tablet, Rfl: 12 .  aspirin 81 MG tablet, Take 81 mg by mouth daily. , Disp: , Rfl:  .  clotrimazole-betamethasone (LOTRISONE) cream, APPLY TO AFFECTED EAR ONCE OR TWICE WEEKLY AS NEEDED FOR SKIN IRRITATION, Disp: , Rfl: 6 .  loratadine (CLARITIN) 10 MG tablet, Take 10 mg by mouth daily., Disp: , Rfl:   Review of Systems  Constitutional: Negative for activity change, appetite change, chills, diaphoresis, fatigue, fever and unexpected weight change.  Cardiovascular: Negative for leg swelling.  Psychiatric/Behavioral: Positive for decreased concentration. Negative for dysphoric mood and suicidal ideas.    Social History  Substance Use Topics  . Smoking status: Never Smoker  . Smokeless tobacco: Never Used  . Alcohol use No   Objective:   BP 118/62 (BP Location: Left Arm, Patient Position: Sitting, Cuff Size: Large)  Pulse 88   Temp (!) 97.3 F (36.3 C) (Oral)   Resp 16   Wt 262 lb (118.8 kg)   BMI 42.29 kg/m  Vitals:   10/26/16 1344  BP: 118/62  Pulse: 88  Resp: 16  Temp: (!) 97.3 F (36.3 C)  TempSrc: Oral  Weight: 262 lb (118.8 kg)     Physical Exam  Constitutional: She appears well-developed and well-nourished.  HENT:  Head: Normocephalic.  Eyes: Conjunctivae are normal.  Neck: Normal range of motion.  Cardiovascular: Normal rate, regular rhythm and normal heart sounds.   Pulmonary/Chest: Effort normal and breath sounds normal. No  respiratory distress.  Musculoskeletal: She exhibits edema (trace).  Psychiatric: She has a normal mood and affect. Her behavior is normal.        Assessment & Plan:     1. Depression, unspecified depression type PHQ-9 improved today at 6. Continue Cymbalta, as it is improving pain. FU 4 months. Possible psychiatric disorder vs cognitive impairment. 2. Medication management Will refer to Dr. Vicente Masson as below. Per son, pt is not taking medications as prescribed. MCI vs psychiatric disturbance. - Ambulatory referral to Geriatrics  3. Seasonal allergic rhinitis due to pollen Pt is c/o dry nasal passages. Advised pt antihistamines could worsen the problem. Can continue Flonase as directed. Use saline nasal spray or gel PRN.  4. Overactive bladder Stable on Myrbetriq. FU with urology as scheduled.     I have done the exam and reviewed the above chart and it is accurate to the best of my knowledge. Development worker, community has been used in this note in any air is in the dictation or transcription are unintentional.  Wilhemena Durie, MD  [place]

## 2016-10-27 DIAGNOSIS — R609 Edema, unspecified: Secondary | ICD-10-CM | POA: Diagnosis not present

## 2016-10-27 DIAGNOSIS — M1990 Unspecified osteoarthritis, unspecified site: Secondary | ICD-10-CM | POA: Diagnosis not present

## 2016-10-27 DIAGNOSIS — I10 Essential (primary) hypertension: Secondary | ICD-10-CM | POA: Diagnosis not present

## 2016-10-27 DIAGNOSIS — H6991 Unspecified Eustachian tube disorder, right ear: Secondary | ICD-10-CM | POA: Diagnosis not present

## 2016-10-27 DIAGNOSIS — F3289 Other specified depressive episodes: Secondary | ICD-10-CM | POA: Diagnosis not present

## 2016-10-27 DIAGNOSIS — G4733 Obstructive sleep apnea (adult) (pediatric): Secondary | ICD-10-CM | POA: Diagnosis not present

## 2016-10-30 DIAGNOSIS — H6980 Other specified disorders of Eustachian tube, unspecified ear: Secondary | ICD-10-CM | POA: Diagnosis not present

## 2016-10-30 DIAGNOSIS — H6060 Unspecified chronic otitis externa, unspecified ear: Secondary | ICD-10-CM | POA: Diagnosis not present

## 2016-10-30 DIAGNOSIS — J301 Allergic rhinitis due to pollen: Secondary | ICD-10-CM | POA: Diagnosis not present

## 2016-10-31 DIAGNOSIS — H01021 Squamous blepharitis right upper eyelid: Secondary | ICD-10-CM | POA: Diagnosis not present

## 2016-11-01 ENCOUNTER — Telehealth: Payer: Self-pay | Admitting: Family Medicine

## 2016-11-01 NOTE — Telephone Encounter (Signed)
[facility] Called to say Pt missed her appt today with homecare nurse.  FYI.Marland Kitchen

## 2016-11-02 ENCOUNTER — Telehealth: Payer: Self-pay | Admitting: Family Medicine

## 2016-11-02 DIAGNOSIS — R609 Edema, unspecified: Secondary | ICD-10-CM | POA: Diagnosis not present

## 2016-11-02 DIAGNOSIS — H6991 Unspecified Eustachian tube disorder, right ear: Secondary | ICD-10-CM | POA: Diagnosis not present

## 2016-11-02 DIAGNOSIS — M1990 Unspecified osteoarthritis, unspecified site: Secondary | ICD-10-CM | POA: Diagnosis not present

## 2016-11-02 DIAGNOSIS — G4733 Obstructive sleep apnea (adult) (pediatric): Secondary | ICD-10-CM | POA: Diagnosis not present

## 2016-11-02 DIAGNOSIS — I10 Essential (primary) hypertension: Secondary | ICD-10-CM | POA: Diagnosis not present

## 2016-11-02 DIAGNOSIS — F3289 Other specified depressive episodes: Secondary | ICD-10-CM | POA: Diagnosis not present

## 2016-11-02 NOTE — Telephone Encounter (Signed)
Home health nurse states that the patient did not want to be seen today because she had a funeral to attend.  The patient is on a 1 per week visit and di not want to reschedule. The nurse did convey that there appears to be tension between the mother and the son and she was a little concerned about this.   She will notify us if there are any issues next week when she goes for her visit.  ED

## 2016-11-02 NOTE — Telephone Encounter (Signed)
fyi-aa 

## 2016-11-02 NOTE — Telephone Encounter (Signed)
Princess with [facility] called to say Ms. Booze may miss her home health therapies for a week while out of town,  [place].  Thanks [employer]

## 2016-11-02 NOTE — Telephone Encounter (Signed)
See below-aa 

## 2016-11-03 ENCOUNTER — Encounter: Payer: Self-pay | Admitting: Family Medicine

## 2016-11-03 ENCOUNTER — Ambulatory Visit (INDEPENDENT_AMBULATORY_CARE_PROVIDER_SITE_OTHER): Payer: Medicare Other | Admitting: Family Medicine

## 2016-11-03 VITALS — BP 130/78 | HR 88 | Resp 16 | Ht 66.0 in | Wt 260.0 lb

## 2016-11-03 DIAGNOSIS — Z853 Personal history of malignant neoplasm of breast: Secondary | ICD-10-CM | POA: Diagnosis not present

## 2016-11-03 DIAGNOSIS — I10 Essential (primary) hypertension: Secondary | ICD-10-CM

## 2016-11-03 DIAGNOSIS — G3184 Mild cognitive impairment, so stated: Secondary | ICD-10-CM

## 2016-11-03 DIAGNOSIS — K219 Gastro-esophageal reflux disease without esophagitis: Secondary | ICD-10-CM

## 2016-11-03 DIAGNOSIS — M159 Polyosteoarthritis, unspecified: Secondary | ICD-10-CM

## 2016-11-03 DIAGNOSIS — R2681 Unsteadiness on feet: Secondary | ICD-10-CM

## 2016-11-03 DIAGNOSIS — F429 Obsessive-compulsive disorder, unspecified: Secondary | ICD-10-CM

## 2016-11-03 DIAGNOSIS — F324 Major depressive disorder, single episode, in partial remission: Secondary | ICD-10-CM

## 2016-11-03 DIAGNOSIS — M150 Primary generalized (osteo)arthritis: Secondary | ICD-10-CM

## 2016-11-03 DIAGNOSIS — F329 Major depressive disorder, single episode, unspecified: Secondary | ICD-10-CM | POA: Insufficient documentation

## 2016-11-03 DIAGNOSIS — F32A Depression, unspecified: Secondary | ICD-10-CM | POA: Insufficient documentation

## 2016-11-03 DIAGNOSIS — J309 Allergic rhinitis, unspecified: Secondary | ICD-10-CM | POA: Diagnosis not present

## 2016-11-03 DIAGNOSIS — N3941 Urge incontinence: Secondary | ICD-10-CM | POA: Diagnosis not present

## 2016-11-03 DIAGNOSIS — E559 Vitamin D deficiency, unspecified: Secondary | ICD-10-CM | POA: Diagnosis not present

## 2016-11-03 MED ORDER — ACETAMINOPHEN 500 MG PO TABS: 1000.0000 mg | ORAL_TABLET | Freq: Three times a day (TID) | ORAL | 0 refills | Status: AC | PRN

## 2016-11-03 MED ORDER — RANITIDINE HCL 150 MG PO TABS: 150.0000 mg | ORAL_TABLET | Freq: Two times a day (BID) | ORAL | 12 refills | Status: DC

## 2016-11-03 NOTE — Patient Instructions (Signed)
Stop aspirin and meloxicam and Alka-Seltzer.

## 2016-11-03 NOTE — Progress Notes (Addendum)
Date:  11/03/2016   Name:  Lisa Paul   DOB:  1934-11-28   MRN:  [id number]  PCP:  Jerrol Banana., MD    Chief Complaint: Advice Only ([facility] Ref )   History of Present Illness:  This is a 82 y.o. female seen in geriatric consultation for Dr. Rosanna Randy. Placed on Cymbalta for depression/chronic pain one month ago, helping pain, not sure about mood. Reports depressed since husband died in February 08, 2023. AR on Flonase daily which helps. Urology switched from Ditropan to Myrbetriq one month ago, UI better. HTN on Hyzaar, took one week course Lasix for BLE edema recently. OA s/p B TKR, c/o B hip and back pain, gets cortisone shots in spine form ortho every 2-3 months, on Mobic x 2 yrs, thinks Tylenol works as well. Hx breast ca s/p lumpectomy/RT 2012 on Arimidex x 66yrs, stopped one yr ago. GERD on Zantac/Alka-Seltzer daily. Unsure why on asa. Sees ENT for constant digging at ears, ezcema vs. OCD. Hx OSA on CPAP in 90s, no longer snores as much, denies daytime sleepiness. Hx RLS no longer bothering. Gait instability with falls in past, none recent, uses cane when out of house, HHPT seeing. Hx vit D def not currently taking supplement, DEXA 2016 normal. Has psych referral from [facility] but has not seen yet. Father died lung dz/CVA 5, mother died 34 colon ca, brother died heart dz (possible schizophrenia per son), another brother alive with heart dz. Colonoscopy 6 yrs ago showed polyp, was due in 2017, plans to reschedule with GI. Last mammo 2017. Imms UTD.  Review of Systems:  Review of Systems  Constitutional: Negative for chills and fever.  HENT: Negative for ear pain, sinus pain and trouble swallowing.   Eyes: Negative for pain.  Respiratory: Negative for cough and shortness of breath.   Cardiovascular: Negative for chest pain.  Gastrointestinal: Negative for abdominal pain, constipation and diarrhea.  Endocrine: Negative for polydipsia and polyuria.  Genitourinary: Negative for difficulty urinating  and dysuria.  Neurological: Negative for syncope and light-headedness.    Patient Active Problem List   Diagnosis Date Noted  . Gait instability 11/03/2016  . Depression 11/03/2016  . Mild cognitive impairment 11/03/2016  . Urge incontinence 11/03/2016  . OCD (obsessive compulsive disorder) 11/03/2016  . Allergic rhinitis 09/10/2014  . Chronic infection of sinus 09/10/2014  . Acid reflux 09/10/2014  . Auditory impairment 09/10/2014  . BP (high blood pressure) 09/10/2014  . Obesity, morbid, BMI 40.0-49.9 ([place]) 09/10/2014  . Arthritis, degenerative 09/10/2014  . Restless legs syndrome 09/10/2014  . History of sleep apnea 09/10/2014  . Vitamin D deficiency 09/10/2014  . History of breast cancer 10/23/2012    Prior to Admission medications   Medication Sig Start Date End Date Taking? Authorizing Provider  clotrimazole-betamethasone (LOTRISONE) cream APPLY TO AFFECTED EAR ONCE OR TWICE WEEKLY AS NEEDED FOR SKIN IRRITATION 08/11/16  Yes [provider]  DULoxetine (CYMBALTA) 20 MG capsule Take 1 capsule (20 mg total) by mouth daily. 09/26/16  Yes Jerrol Banana., MD  fluticasone [facility]) 50 MCG/ACT nasal spray Place 2 sprays into both nostrils daily. 05/10/16  Yes Jerrol Banana., MD  gentamicin ointment (GARAMYCIN) 0.1 % APPLY SMALL AMOUNT TO EACH NOSTRIL 2-3 TIMES DAILY FOR 10 DAYS. MAY REPEAT AS NEEDED. 08/11/16  Yes [provider]  losartan-hydrochlorothiazide (HYZAAR) 100-25 MG tablet TAKE 1 TABLET BY MOUTH EVERY DAY 01/03/16  Yes Jerrol Banana., MD  mirabegron ER (MYRBETRIQ) 50 MG TB24  tablet Take 1 tablet (50 mg total) by mouth daily. 10/18/16  Yes McGowan, Larene Beach A, PA-C  Neomycin-Bacitracin Zn-Polymyx (NEO-POLYCIN OP) Apply to eye.   Yes [provider]  ranitidine (ZANTAC) 150 MG tablet Take 1 tablet (150 mg total) by mouth at bedtime. 10/14/15  Yes Jerrol Banana., MD  acetaminophen (TYLENOL) 500 MG tablet Take 2 tablets (1,000  mg total) by mouth every 8 (eight) hours as needed. 11/03/16   Adline Potter, MD    Allergies  Allergen Reactions  . Tape     Paper tape - blisters    Past Surgical History:  Procedure Laterality Date  . ABDOMINAL HYSTERECTOMY  30 years ago  . BREAST BIOPSY Left 2012   invasive mammary carcinoma  . BREAST LUMPECTOMY Left 2012  . CARPAL TUNNEL RELEASE Bilateral   . COLONOSCOPY  05/2003   Dr Nicolasa Ducking [facility]  . COLONOSCOPY  07/24/12  . EYE SURGERY Bilateral 2005   cataract  . JOINT REPLACEMENT Bilateral 2007   knees  . mammosite balloon placement Left 08/2010   Removal 09/2010  . TONSILLECTOMY  age 38    Social History  Substance Use Topics  . Smoking status: Never Smoker  . Smokeless tobacco: Never Used  . Alcohol use No    Family History  Problem Relation Age of Onset  . Colon cancer Mother   . Stroke Brother        possibly. Pt is unsure.  . Prostate cancer Neg Hx   . Bladder Cancer Neg Hx   . Kidney cancer Neg Hx     Medication list has been reviewed and updated.  Physical Examination: BP 130/78   Pulse 88   Resp 16   Ht 5\' 6"  (1.676 m)   Wt 260 lb (117.9 kg)   SpO2 96%   BMI 41.97 kg/m   Physical Exam  Constitutional: She is oriented to person, place, and time. She appears well-developed and well-nourished.  HENT:  Head: Normocephalic and atraumatic.  Right Ear: External ear normal.  Left Ear: External ear normal.  Nose: Nose normal.  Mouth/Throat: Oropharynx is clear and moist.  TMs clear  Eyes: Pupils are equal, round, and reactive to light. Conjunctivae and EOM are normal. No scleral icterus.  Neck: Neck supple. No thyromegaly present.  Cardiovascular: Normal rate, regular rhythm and normal heart sounds.   Pulmonary/Chest: Effort normal and breath sounds normal.  Abdominal: Soft. She exhibits no distension and no mass. There is no tenderness.  Musculoskeletal:  Trace BLE edema  Lymphadenopathy:    She has no cervical adenopathy.  Neurological:  She is alert and oriented to person, place, and time. Coordination normal.  SLUMS 26/30 Romberg positive, gait wide-based, unsteady  Skin: Skin is warm and dry.  Psychiatric: Her behavior is normal.  Flat affect GDS 6/15  Nursing note and vitals reviewed.   Assessment and Plan:  1. Essential hypertension Well controlled on Hyzaar  2. Gastroesophageal reflux disease, esophagitis presence not specified Marginal control on Zantac/Alka-Seltzer, d/c Alka-seltzer, use Zantac bid  3. Primary osteoarthritis involving multiple joints With chronic B hip/back pain improved on Cymbalta, d/c Mobic, use Tylenol prn instead  4. Gait instability Seeing HHPT - B12  5. Major depressive disorder with single episode, in partial remission (HCC) Slightly improved on Cymbalta x 1 month, consider increased dose  6. Mild cognitive impairment Likely related to depression  7. Urge incontinence Improved on Myrbetriq, urology following  8. Obsessive-compulsive disorder, unspecified type For psych eval  9. Allergic rhinitis, unspecified seasonality, unspecified trigger Well controlled on Flonase  10. Avitaminosis D Off supplement - Vitamin D (25 hydroxy)  11. Obesity, morbid, BMI 40.0-49.9 (HCC) Weight stable, exercise/weight loss discussed  12. Med review D/c asa as no clear indication for use  Return in about 4 weeks (around 12/01/2016).   One hour spent with patient/family over half in counseling  Satira Anis. [facility]  11/03/2016

## 2016-11-04 LAB — VITAMIN B12: Vitamin B-12: 251 pg/mL (ref 232–1245)

## 2016-11-04 LAB — VITAMIN D 25 HYDROXY (VIT D DEFICIENCY, FRACTURES): VIT D 25 HYDROXY: 14.1 ng/mL — AB (ref 30.0–100.0)

## 2016-11-06 ENCOUNTER — Other Ambulatory Visit: Payer: Self-pay | Admitting: Family Medicine

## 2016-11-06 ENCOUNTER — Encounter: Payer: Self-pay | Admitting: Family Medicine

## 2016-11-06 DIAGNOSIS — E538 Deficiency of other specified B group vitamins: Secondary | ICD-10-CM | POA: Insufficient documentation

## 2016-11-06 DIAGNOSIS — F3289 Other specified depressive episodes: Secondary | ICD-10-CM

## 2016-11-06 MED ORDER — B-12 1000 MCG PO TABS: 1.0000 | ORAL_TABLET | Freq: Every day | ORAL | Status: AC

## 2016-11-06 MED ORDER — VITAMIN D3 50 MCG (2000 UT) PO CAPS: 2000.0000 [IU] | ORAL_CAPSULE | Freq: Every day | ORAL | Status: DC

## 2016-11-06 MED ORDER — DULOXETINE HCL 40 MG PO CPEP: 40.0000 mg | ORAL_CAPSULE | Freq: Every day | ORAL | 2 refills | Status: DC

## 2016-11-06 NOTE — Progress Notes (Signed)
VIt D and B12 levels low, begin supplements

## 2016-11-07 DIAGNOSIS — H01021 Squamous blepharitis right upper eyelid: Secondary | ICD-10-CM | POA: Diagnosis not present

## 2016-11-09 ENCOUNTER — Telehealth: Payer: Self-pay

## 2016-11-09 ENCOUNTER — Emergency Department: Payer: Medicare Other

## 2016-11-09 ENCOUNTER — Emergency Department
Admission: EM | Admit: 2016-11-09 | Discharge: 2016-11-09 | Disposition: A | Discharge status: Home / Self Care | Payer: Medicare Other | Attending: Emergency Medicine | Admitting: Emergency Medicine

## 2016-11-09 DIAGNOSIS — Y929 Unspecified place or not applicable: Secondary | ICD-10-CM | POA: Diagnosis not present

## 2016-11-09 DIAGNOSIS — R202 Paresthesia of skin: Secondary | ICD-10-CM | POA: Diagnosis not present

## 2016-11-09 DIAGNOSIS — Y93E8 Activity, other personal hygiene: Secondary | ICD-10-CM | POA: Insufficient documentation

## 2016-11-09 DIAGNOSIS — M25551 Pain in right hip: Secondary | ICD-10-CM | POA: Diagnosis not present

## 2016-11-09 DIAGNOSIS — Y999 Unspecified external cause status: Secondary | ICD-10-CM | POA: Insufficient documentation

## 2016-11-09 DIAGNOSIS — R531 Weakness: Secondary | ICD-10-CM | POA: Diagnosis not present

## 2016-11-09 DIAGNOSIS — S7001XA Contusion of right hip, initial encounter: Secondary | ICD-10-CM | POA: Diagnosis not present

## 2016-11-09 DIAGNOSIS — S79911A Unspecified injury of right hip, initial encounter: Secondary | ICD-10-CM | POA: Diagnosis not present

## 2016-11-09 DIAGNOSIS — W1811XA Fall from or off toilet without subsequent striking against object, initial encounter: Secondary | ICD-10-CM | POA: Diagnosis not present

## 2016-11-09 DIAGNOSIS — I6789 Other cerebrovascular disease: Secondary | ICD-10-CM | POA: Diagnosis not present

## 2016-11-09 DIAGNOSIS — Z853 Personal history of malignant neoplasm of breast: Secondary | ICD-10-CM | POA: Diagnosis not present

## 2016-11-09 DIAGNOSIS — R200 Anesthesia of skin: Secondary | ICD-10-CM | POA: Diagnosis not present

## 2016-11-09 LAB — CBC WITH DIFFERENTIAL/PLATELET
Basophils Absolute: 0.1 10*3/uL (ref 0–0.1)
Basophils Relative: 1 %
EOS ABS: 0.2 10*3/uL (ref 0–0.7)
EOS PCT: 1 %
HCT: 41.5 % (ref 35.0–47.0)
Hemoglobin: 13.7 g/dL (ref 12.0–16.0)
LYMPHS ABS: 1.9 10*3/uL (ref 1.0–3.6)
LYMPHS PCT: 14 %
MCH: 29.3 pg (ref 26.0–34.0)
MCHC: 33.1 g/dL (ref 32.0–36.0)
MCV: 88.6 fL (ref 80.0–100.0)
MONO ABS: 1.3 10*3/uL — AB (ref 0.2–0.9)
Monocytes Relative: 9 %
Neutro Abs: 10.4 10*3/uL — ABNORMAL HIGH (ref 1.4–6.5)
Neutrophils Relative %: 75 %
PLATELETS: 294 10*3/uL (ref 150–440)
RBC: 4.69 MIL/uL (ref 3.80–5.20)
RDW: 14.5 % (ref 11.5–14.5)
WBC: 13.8 10*3/uL — AB (ref 3.6–11.0)

## 2016-11-09 LAB — COMPREHENSIVE METABOLIC PANEL
ALT: 16 U/L (ref 14–54)
ANION GAP: 11 (ref 5–15)
AST: 26 U/L (ref 15–41)
Albumin: 3.8 g/dL (ref 3.5–5.0)
Alkaline Phosphatase: 83 U/L (ref 38–126)
BUN: 28 mg/dL — ABNORMAL HIGH (ref 6–20)
CHLORIDE: 104 mmol/L (ref 101–111)
CO2: 24 mmol/L (ref 22–32)
CREATININE: 1.14 mg/dL — AB (ref 0.44–1.00)
Calcium: 8.8 mg/dL — ABNORMAL LOW (ref 8.9–10.3)
GFR, EST AFRICAN AMERICAN: 51 mL/min — AB (ref >60)
GFR, EST NON AFRICAN AMERICAN: 44 mL/min — AB (ref >60)
Glucose, Bld: 97 mg/dL (ref 65–99)
POTASSIUM: 3.2 mmol/L — AB (ref 3.5–5.1)
SODIUM: 139 mmol/L (ref 135–145)
Total Bilirubin: 0.8 mg/dL (ref 0.3–1.2)
Total Protein: 7.3 g/dL (ref 6.5–8.1)

## 2016-11-09 LAB — URINALYSIS, COMPLETE (UACMP) WITH MICROSCOPIC
BILIRUBIN URINE: NEGATIVE
Bacteria, UA: NONE SEEN
GLUCOSE, UA: NEGATIVE mg/dL
HGB URINE DIPSTICK: NEGATIVE
Ketones, ur: NEGATIVE mg/dL
LEUKOCYTES UA: NEGATIVE
NITRITE: NEGATIVE
PROTEIN: NEGATIVE mg/dL
Specific Gravity, Urine: 1.019 (ref 1.005–1.030)
pH: 5 (ref 5.0–8.0)

## 2016-11-09 LAB — TROPONIN I

## 2016-11-09 NOTE — ED Provider Notes (Signed)
[facility] Emergency Department Provider Note       Time seen: ----------------------------------------- 7:32 PM on 11/09/2016 -----------------------------------------     I have reviewed the triage vital signs and the nursing notes.   HISTORY   Chief Complaint Numbness    HPI Lisa Paul is a 82 y.o. female who presents to the ED for numbness in her legs and feet. Patient states that she got off of the toilet was trying to get the bathroom when her legs would move and she fell. She landed on her right hip. Pain is 3 out of 10, nothing makes it better or worse. Patient reports she's been dealing with a lot of anxiety and depression due to the loss of her husband less than a year ago.   Past Medical History:  Diagnosis Date  . Allergy   . Arthritis 1993  . Cancer [facility]) 2012   Left Breast with lumpectomy and mammosite  . Cataract   . Colon polyp 2005  . GERD (gastroesophageal reflux disease)   . Hypertension 15 years  . Malignant neoplasm of breast (female), unspecified site    left breast    Patient Active Problem List   Diagnosis Date Noted  . B12 deficiency 11/06/2016  . Gait instability 11/03/2016  . Depression 11/03/2016  . Mild cognitive impairment 11/03/2016  . Urge incontinence 11/03/2016  . OCD (obsessive compulsive disorder) 11/03/2016  . Allergic rhinitis 09/10/2014  . Chronic infection of sinus 09/10/2014  . Acid reflux 09/10/2014  . Auditory impairment 09/10/2014  . BP (high blood pressure) 09/10/2014  . Obesity, morbid, BMI 40.0-49.9 ([place]) 09/10/2014  . Arthritis, degenerative 09/10/2014  . Restless legs syndrome 09/10/2014  . History of sleep apnea 09/10/2014  . Vitamin D deficiency 09/10/2014  . History of breast cancer 10/23/2012    Past Surgical History:  Procedure Laterality Date  . ABDOMINAL HYSTERECTOMY  30 years ago  . BREAST BIOPSY Left 2012   invasive mammary carcinoma  . BREAST LUMPECTOMY Left 2012   . CARPAL TUNNEL RELEASE Bilateral   . COLONOSCOPY  05/2003   Dr Nicolasa Ducking [facility]  . COLONOSCOPY  07/24/12  . EYE SURGERY Bilateral 2005   cataract  . JOINT REPLACEMENT Bilateral 2007   knees  . mammosite balloon placement Left 08/2010   Removal 09/2010  . TONSILLECTOMY  age 84    Allergies Tape  Social History Social History  Substance Use Topics  . Smoking status: Never Smoker  . Smokeless tobacco: Never Used  . Alcohol use No    Review of Systems Constitutional: Negative for fever. Cardiovascular: Negative for chest pain. Respiratory: Negative for shortness of breath. Gastrointestinal: Negative for abdominal pain, vomiting and diarrhea. Genitourinary: Negative for dysuria. Musculoskeletal: Negative for back pain. Skin: Negative for rash. Neurological: Negative for headaches, positive for numbness in her legs.  All systems negative/normal/unremarkable except as stated in the HPI  ____________________________________________   PHYSICAL EXAM:  VITAL SIGNS: ED Triage Vitals  Enc Vitals Group     BP 11/09/16 1927 134/70     Pulse Rate 11/09/16 1927 98     Resp 11/09/16 1927 16     Temp 11/09/16 1927 98.1 F (36.7 C)     Temp Source 11/09/16 1927 Oral     SpO2 11/09/16 1927 99 %     Weight 11/09/16 1929 260 lb (117.9 kg)     Height 11/09/16 1929 5\' 6"  (1.676 m)     Head Circumference --  Peak Flow --      Pain Score 11/09/16 1926 3     Pain Loc --      Pain Edu? --      Excl. in [place]? --     Constitutional: Alert and oriented. Well appearing and in no distress. Eyes: Conjunctivae are normal. Normal extraocular movements. ENT   Head: Normocephalic and atraumatic.   Nose: No congestion/rhinnorhea.   Mouth/Throat: Mucous membranes are moist.   Neck: No stridor. Cardiovascular: Normal rate, regular rhythm. No murmurs, rubs, or gallops. Respiratory: Normal respiratory effort without tachypnea nor retractions. Breath sounds are clear and equal  bilaterally. No wheezes/rales/rhonchi. Gastrointestinal: Soft and nontender. Normal bowel sounds Musculoskeletal: Nontender with normal range of motion in extremities. No lower extremity tenderness nor edema. Neurologic:  Normal speech and language. No gross focal neurologic deficits are appreciated. Paresthesias are noted in both legs and feet. Normal sharp and dull sensation in both legs. There appears to be normal strength in her legs as well as her upper extremity. Skin:  Skin is warm, dry and intact. No rash noted. Psychiatric: Depressed mood and affect ____________________________________________  EKG: Interpreted by me. Sinus rhythm rate of 98 bpm, normal PR interval, normal QRS, normal QT.  ____________________________________________  ED COURSE:  Pertinent labs & imaging results that were available during my care of the patient were reviewed by me and considered in my medical decision making (see chart for details). Patient presents for weakness, numbness and a fall, we will assess with labs and imaging as indicated.   Procedures ____________________________________________   LABS (pertinent positives/negatives)  Labs Reviewed  CBC WITH DIFFERENTIAL/PLATELET - Abnormal; Notable for the following:       Result Value   WBC 13.8 (*)    Neutro Abs 10.4 (*)    Monocytes Absolute 1.3 (*)    All other components within normal limits  COMPREHENSIVE METABOLIC PANEL - Abnormal; Notable for the following:    Potassium 3.2 (*)    BUN 28 (*)    Creatinine, Ser 1.14 (*)    Calcium 8.8 (*)    GFR calc non Af Amer 44 (*)    GFR calc Af Amer 51 (*)    All other components within normal limits  URINALYSIS, COMPLETE (UACMP) WITH MICROSCOPIC - Abnormal; Notable for the following:    Color, Urine YELLOW (*)    APPearance CLEAR (*)    Squamous Epithelial / LPF 0-5 (*)    All other components within normal limits  TROPONIN I    RADIOLOGY Images were viewed by me  Hip x-ray, CT  head IMPRESSION: Mild atrophy. No acute findings. IMPRESSION: No evidence of acute hip fracture. Severe osteoarthritis of the right hip present. ____________________________________________  FINAL ASSESSMENT AND PLAN  Paresthesia, hip contusion  Plan: Patient's labs and imaging were dictated above. Patient had presented for paresthesias to her feet after she was sitting on the commode. I have advised I cannot find a specific source for paresthesias. This may be from neuropathy or arthritis or positional from sitting. Overall she appears to be neurologically intact other than some paresthesias in the toes of both feet. X-ray reveals arthritis. She is in no acute distress and otherwise she is stable for outpatient follow-up.   Earleen Newport, MD   Note: This note was generated in part or whole with voice recognition software. Voice recognition is usually quite accurate but there are transcription errors that can and very often do occur. I apologize for any typographical errors  that were not detected and corrected.     Earleen Newport, MD 11/09/16 [phone]

## 2016-11-09 NOTE — ED Triage Notes (Signed)
Pt arrived to ED via EMS from home with complaints of numbness in there legs and feet. She states that she got off the toilet and was trying to get to the bedroom when her legs wouldn't move and she fell to the ground landing on her right thigh and hip. Rates pain a 3 out of 10. VS per EMS BP-139/75 HR-90 BS-86 R-18,reg. PT has Hx of HTN and arthritis.

## 2016-11-09 NOTE — Telephone Encounter (Signed)
Augusto Garbe from [facility] home health called and said patient missed a scheduled visit and will miss the entire wee since she is currently out of town-aa

## 2016-11-14 ENCOUNTER — Ambulatory Visit: Payer: Medicare Other | Admitting: Urology

## 2016-11-16 ENCOUNTER — Telehealth: Payer: Self-pay | Admitting: Family Medicine

## 2016-11-16 NOTE — Telephone Encounter (Signed)
Please review. Thanks!  

## 2016-11-16 NOTE — Telephone Encounter (Signed)
Pt would like DR. Rosanna Randy to call her back on regarding  a hospital visit to the ER with a fall last week and also obtaining assisted care. Pt only wants to speak to Dr. Rosanna Randy and only talk to her.    Her call back [phone] or [phone]  Thanks Con Memos

## 2016-11-27 ENCOUNTER — Ambulatory Visit: Payer: Medicare Other | Admitting: Family Medicine

## 2016-12-07 ENCOUNTER — Ambulatory Visit: Payer: Medicare Other | Admitting: Family Medicine

## 2017-01-07 ENCOUNTER — Other Ambulatory Visit: Payer: Self-pay | Admitting: Family Medicine

## 2017-01-07 DIAGNOSIS — K219 Gastro-esophageal reflux disease without esophagitis: Secondary | ICD-10-CM

## 2017-01-08 ENCOUNTER — Telehealth: Payer: Self-pay | Admitting: General Surgery

## 2017-01-08 NOTE — Telephone Encounter (Signed)
PATIENT CALLED WANTING DR Jamal Collin TO ORDER HER DX MAMMO FOR 2018.SHE WAS UNABLE TO COME LAST YR AFTER SHE HAD HER MAMMO.HER HUSBAND PASTED AWAY & SHE SPENT A LOT OF TIME OUT OF TOWN. SHE STATES IF YOU ORDER IT SHE WILL FOLLOW-UP WITH YOU. PLEASE ADVISE. IF WE CAN REACH HER ON HER H# PLEASE LEAVE A MESSAGE WITH ALL THE INFORMATION.

## 2017-01-09 ENCOUNTER — Other Ambulatory Visit: Payer: Self-pay | Admitting: Family Medicine

## 2017-01-09 DIAGNOSIS — M5431 Sciatica, right side: Secondary | ICD-10-CM

## 2017-01-09 DIAGNOSIS — M1990 Unspecified osteoarthritis, unspecified site: Secondary | ICD-10-CM

## 2017-01-10 ENCOUNTER — Encounter: Payer: Self-pay | Admitting: *Deleted

## 2017-01-10 ENCOUNTER — Other Ambulatory Visit: Payer: Self-pay | Admitting: *Deleted

## 2017-01-10 DIAGNOSIS — C50412 Malignant neoplasm of upper-outer quadrant of left female breast: Secondary | ICD-10-CM

## 2017-01-10 DIAGNOSIS — Z170 Estrogen receptor positive status [ER+]: Principal | ICD-10-CM

## 2017-01-24 ENCOUNTER — Ambulatory Visit (INDEPENDENT_AMBULATORY_CARE_PROVIDER_SITE_OTHER): Payer: Medicare Other

## 2017-01-24 DIAGNOSIS — Z23 Encounter for immunization: Secondary | ICD-10-CM | POA: Diagnosis not present

## 2017-02-05 ENCOUNTER — Ambulatory Visit
Admission: RE | Admit: 2017-02-05 | Discharge: 2017-02-05 | Disposition: A | Discharge status: Home / Self Care | Payer: Medicare Other | Source: Ambulatory Visit | Attending: General Surgery | Admitting: General Surgery

## 2017-02-05 DIAGNOSIS — Z170 Estrogen receptor positive status [ER+]: Secondary | ICD-10-CM

## 2017-02-05 DIAGNOSIS — Z853 Personal history of malignant neoplasm of breast: Secondary | ICD-10-CM | POA: Insufficient documentation

## 2017-02-05 DIAGNOSIS — R928 Other abnormal and inconclusive findings on diagnostic imaging of breast: Secondary | ICD-10-CM | POA: Diagnosis not present

## 2017-02-05 DIAGNOSIS — C50412 Malignant neoplasm of upper-outer quadrant of left female breast: Secondary | ICD-10-CM

## 2017-02-19 ENCOUNTER — Encounter: Payer: Self-pay | Admitting: General Surgery

## 2017-02-19 ENCOUNTER — Ambulatory Visit (INDEPENDENT_AMBULATORY_CARE_PROVIDER_SITE_OTHER): Payer: Medicare Other | Admitting: General Surgery

## 2017-02-19 VITALS — BP 142/78 | HR 82 | Resp 14 | Ht 66.0 in | Wt 256.0 lb

## 2017-02-19 DIAGNOSIS — Z86010 Personal history of colonic polyps: Secondary | ICD-10-CM

## 2017-02-19 DIAGNOSIS — Z853 Personal history of malignant neoplasm of breast: Secondary | ICD-10-CM | POA: Diagnosis not present

## 2017-02-19 NOTE — Patient Instructions (Addendum)
Follow up with Dr. Bary Castilla in 1 year.

## 2017-02-19 NOTE — Progress Notes (Signed)
Patient ID: Lisa Paul, female   DOB: [birth year], 82 y.o.   MRN: [id number]  Chief Complaint  Patient presents with  . Follow-up    mammogram    HPI Lisa Paul is a 82 y.o. female with a history of left breast cancer and left Mammosite placement who presents for a breast evaluation. The most recent mammogram was done on 02/05/17. Patient does perform regular self breast checks and gets regular mammograms done. She has no new breast problems to report. Stopped taking Anastrozole last year. She has problems with walking any distances, she is working on this.   HPI  Past Medical History:  Diagnosis Date  . Allergy   . Arthritis 1993  . Cancer [facility]) 2012   Left Breast with lumpectomy and mammosite  . Cataract   . Colon polyp 2005  . GERD (gastroesophageal reflux disease)   . Hypertension 15 years  . Malignant neoplasm of breast (female), unspecified site    left breast    Past Surgical History:  Procedure Laterality Date  . ABDOMINAL HYSTERECTOMY  30 years ago  . BREAST BIOPSY Left 2012   invasive mammary carcinoma  . BREAST LUMPECTOMY Left 2012  . CARPAL TUNNEL RELEASE Bilateral   . COLONOSCOPY  05/2003   Dr Nicolasa Ducking [facility]  . COLONOSCOPY  07/24/12  . EYE SURGERY Bilateral 2005   cataract  . JOINT REPLACEMENT Bilateral 2007   knees  . mammosite balloon placement Left 08/2010   Removal 09/2010  . TONSILLECTOMY  age 49    Family History  Problem Relation Age of Onset  . Colon cancer Mother   . Stroke Brother        possibly. Pt is unsure.  . Prostate cancer Neg Hx   . Bladder Cancer Neg Hx   . Kidney cancer Neg Hx   . Breast cancer Neg Hx     Social History Social History   Tobacco Use  . Smoking status: Never Smoker  . Smokeless tobacco: Never Used  Substance Use Topics  . Alcohol use: No  . Drug use: No    Allergies  Allergen Reactions  . Tape     Paper tape - blisters    Current Outpatient Medications  Medication Sig Dispense Refill  .  acetaminophen (TYLENOL) 500 MG tablet Take 2 tablets (1,000 mg total) by mouth every 8 (eight) hours as needed. 30 tablet 0  . Cholecalciferol (VITAMIN D3) 2000 units capsule Take 1 capsule (2,000 Units total) by mouth daily.    . clotrimazole-betamethasone (LOTRISONE) cream APPLY TO AFFECTED EAR ONCE OR TWICE WEEKLY AS NEEDED FOR SKIN IRRITATION  6  . Cyanocobalamin (B-12) 1000 MCG TABS Take 1 tablet by mouth daily. 30 tablet   . DULoxetine HCl 40 MG CPEP Take 40 mg by mouth daily. 30 capsule 2  . fluticasone (FLONASE) 50 MCG/ACT nasal spray Place 2 sprays into both nostrils daily. 16 g 12  . gentamicin ointment (GARAMYCIN) 0.1 % APPLY SMALL AMOUNT TO EACH NOSTRIL 2-3 TIMES DAILY FOR 10 DAYS. MAY REPEAT AS NEEDED.  2  . losartan-hydrochlorothiazide (HYZAAR) 100-25 MG tablet TAKE 1 TABLET BY MOUTH EVERY DAY 90 tablet 3  . meloxicam (MOBIC) 7.5 MG tablet TAKE 1 TABLET (7.5 MG TOTAL) BY MOUTH DAILY. 30 tablet 5  . mirabegron ER (MYRBETRIQ) 50 MG TB24 tablet Take 1 tablet (50 mg total) by mouth daily. 90 tablet 3  . Neomycin-Bacitracin Zn-Polymyx (NEO-POLYCIN OP) Apply to eye.    . ranitidine (ZANTAC) 150  MG tablet TAKE 1 TABLET (150 MG TOTAL) BY MOUTH AT BEDTIME. 30 tablet 12   No current facility-administered medications for this visit.     Review of Systems Review of Systems  Constitutional: Negative.   Respiratory: Negative.   Cardiovascular: Negative.     Blood pressure (!) 142/78, pulse 82, resp. rate 14, height 5\' 6"  (1.676 m), weight 256 lb (116.1 kg).  Physical Exam Physical Exam  Constitutional: She is oriented to person, place, and time. She appears well-developed and well-nourished.  Eyes: Conjunctivae are normal. No scleral icterus.  Neck: Neck supple.  Cardiovascular: Normal rate, regular rhythm and normal heart sounds.  Pulmonary/Chest: Effort normal and breath sounds normal. Right breast exhibits no inverted nipple, no mass, no nipple discharge, no skin change and no  tenderness. Left breast exhibits no inverted nipple, no mass, no nipple discharge, no skin change and no tenderness.  Scarring from mammocyte noted in left breast periphery at 2ocl, stable  Abdominal: There is no splenomegaly or hepatomegaly.  Lymphadenopathy:    She has no cervical adenopathy.    She has no axillary adenopathy.  Neurological: She is alert and oriented to person, place, and time.  Skin: Skin is warm and dry.  Psychiatric: She has a normal mood and affect.    Data Reviewed Previous notes, Mammogram Mammogram revealed stable left breast lumpectomy changes. No evidence of malignancy.   Assessment    CA left breast, 6 yrs post lumpectomy,SN biopsy and mammocyte radiation.Completed 5 years anastrozole, no longer taking. Mammogram and exam stable.    History of colonic polyps, last colonoscopy 07/24/12.  Plan   CA 27,29. Mammogram and exam with Dr. Bary Castilla in 1 year.     HPI, Physical Exam, Assessment and Plan have been scribed under the direction and in the presence of Mckinley Jewel, MD  Concepcion Living, LPN  I have completed the exam and reviewed the above documentation for accuracy and completeness.  I agree with the above.  Haematologist has been used and any errors in dictation or transcription are unintentional.  Seeplaputhur G. Jamal Collin, M.D., F.A.C.S.  Junie Panning G 02/19/2017, 12:36 PM

## 2017-02-20 ENCOUNTER — Telehealth: Payer: Self-pay | Admitting: *Deleted

## 2017-02-20 LAB — CANCER ANTIGEN 27.29: CAN 27.29: 10.5 U/mL (ref 0.0–38.6)

## 2017-02-20 NOTE — Telephone Encounter (Signed)
-----   Message from Christene Lye, MD sent at 02/20/2017  8:53 AM EST ----- Normal lab. Please inform pt.

## 2017-02-20 NOTE — Telephone Encounter (Signed)
Notified patient as instructed, patient pleased. Discussed follow-up appointments, patient agrees  

## 2017-02-26 ENCOUNTER — Ambulatory Visit: Payer: Self-pay | Admitting: Family Medicine

## 2017-03-28 ENCOUNTER — Ambulatory Visit (INDEPENDENT_AMBULATORY_CARE_PROVIDER_SITE_OTHER): Payer: Medicare Other | Admitting: Family Medicine

## 2017-03-28 ENCOUNTER — Encounter: Payer: Self-pay | Admitting: Family Medicine

## 2017-03-28 VITALS — BP 128/72 | HR 80 | Temp 98.0°F | Resp 16 | Wt 267.0 lb

## 2017-03-28 DIAGNOSIS — I10 Essential (primary) hypertension: Secondary | ICD-10-CM | POA: Diagnosis not present

## 2017-03-28 DIAGNOSIS — M150 Primary generalized (osteo)arthritis: Secondary | ICD-10-CM | POA: Diagnosis not present

## 2017-03-28 DIAGNOSIS — G3184 Mild cognitive impairment, so stated: Secondary | ICD-10-CM | POA: Diagnosis not present

## 2017-03-28 DIAGNOSIS — F3289 Other specified depressive episodes: Secondary | ICD-10-CM | POA: Diagnosis not present

## 2017-03-28 DIAGNOSIS — M159 Polyosteoarthritis, unspecified: Secondary | ICD-10-CM

## 2017-03-28 MED ORDER — DULOXETINE HCL 40 MG PO CPEP: 40.0000 mg | ORAL_CAPSULE | Freq: Every day | ORAL | 1 refills | Status: DC

## 2017-03-28 MED ORDER — TRAMADOL HCL 50 MG PO TABS: 50.0000 mg | ORAL_TABLET | Freq: Four times a day (QID) | ORAL | 3 refills | Status: DC | PRN

## 2017-03-28 NOTE — Patient Instructions (Signed)
Stop Meloxicam

## 2017-03-28 NOTE — Progress Notes (Signed)
Patient: Lisa Paul Female    DOB: 1934/11/15   82 y.o.   MRN: [id number] Visit Date: 03/28/2017  Today's Provider: Wilhemena Durie, MD   Chief Complaint  Patient presents with  . Follow-up  . Obesity   Subjective:    HPI Pt is here today for a 4 month follow up. She reports that she has been has been feeling ok, but she can tell things are changing with her body. She reports that she is not taking Cymbalta. She does not feel  Like she is depressed however it did help her pain. But she stopped it any ways. She reports that she will be going to an assisted living soon. She request that she get a refill on her tramadol today. She has not had this in a while but thinks it helped with her arthritis pain. She is taking meloxicam.       Allergies  Allergen Reactions  . Tape     Paper tape - blisters     Current Outpatient Medications:  .  acetaminophen (TYLENOL) 500 MG tablet, Take 2 tablets (1,000 mg total) by mouth every 8 (eight) hours as needed., Disp: 30 tablet, Rfl: 0 .  Cholecalciferol (VITAMIN D3) 2000 units capsule, Take 1 capsule (2,000 Units total) by mouth daily., Disp: , Rfl:  .  clotrimazole-betamethasone (LOTRISONE) cream, APPLY TO AFFECTED EAR ONCE OR TWICE WEEKLY AS NEEDED FOR SKIN IRRITATION, Disp: , Rfl: 6 .  Cyanocobalamin (B-12) 1000 MCG TABS, Take 1 tablet by mouth daily., Disp: 30 tablet, Rfl:  .  fluticasone (FLONASE) 50 MCG/ACT nasal spray, Place 2 sprays into both nostrils daily., Disp: 16 g, Rfl: 12 .  gentamicin ointment (GARAMYCIN) 0.1 %, APPLY SMALL AMOUNT TO EACH NOSTRIL 2-3 TIMES DAILY FOR 10 DAYS. MAY REPEAT AS NEEDED., Disp: , Rfl: 2 .  losartan-hydrochlorothiazide (HYZAAR) 100-25 MG tablet, TAKE 1 TABLET BY MOUTH EVERY DAY, Disp: 90 tablet, Rfl: 3 .  meloxicam (MOBIC) 7.5 MG tablet, TAKE 1 TABLET (7.5 MG TOTAL) BY MOUTH DAILY., Disp: 30 tablet, Rfl: 5 .  mirabegron ER (MYRBETRIQ) 50 MG TB24 tablet, Take 1 tablet (50 mg total) by mouth  daily., Disp: 90 tablet, Rfl: 3 .  ranitidine (ZANTAC) 150 MG tablet, TAKE 1 TABLET (150 MG TOTAL) BY MOUTH AT BEDTIME., Disp: 30 tablet, Rfl: 12 .  DULoxetine HCl 40 MG CPEP, Take 40 mg by mouth daily. (Patient not taking: Reported on 03/28/2017), Disp: 30 capsule, Rfl: 2 .  Neomycin-Bacitracin Zn-Polymyx (NEO-POLYCIN OP), Apply to eye., Disp: , Rfl:   Review of Systems  Constitutional: Negative.   HENT: Negative.   Eyes: Positive for discharge.  Respiratory: Negative.   Cardiovascular: Negative.   Gastrointestinal: Negative.   Endocrine: Negative.   Genitourinary: Negative.   Musculoskeletal: Negative.   Skin: Negative.   Allergic/Immunologic: Negative.   Neurological: Negative.   Hematological: Negative.   Psychiatric/Behavioral: Negative.     Social History   Tobacco Use  . Smoking status: Never Smoker  . Smokeless tobacco: Never Used  Substance Use Topics  . Alcohol use: No   Objective:   BP 128/72 (BP Location: Left Arm, Patient Position: Sitting, Cuff Size: Large)   Pulse 80   Temp 98 F (36.7 C) (Oral)   Resp 16   Wt 267 lb (121.1 kg)   BMI 43.09 kg/m  Vitals:   03/28/17 1151  BP: 128/72  Pulse: 80  Resp: 16  Temp: 98 F (36.7 C)  TempSrc: Oral  Weight: 267 lb (121.1 kg)     Physical Exam  Constitutional: She is oriented to person, place, and time. She appears well-developed and well-nourished.  HENT:  Head: Normocephalic and atraumatic.  Eyes: Conjunctivae are normal. No scleral icterus.  Neck: No thyromegaly present.  Cardiovascular: Normal rate, regular rhythm and normal heart sounds.  Pulmonary/Chest: Effort normal and breath sounds normal.  Abdominal: Soft.  Musculoskeletal: She exhibits no deformity.  Neurological: She is alert and oriented to person, place, and time.  Skin: Skin is warm and dry.  Psychiatric: She has a normal mood and affect. Her behavior is normal.        Assessment & Plan:     1. Other depression Partial  remission.Refill Cymbalta. PHQ9 is 9 today. 2. Primary osteoarthritis involving multiple joints D/c Meloxicam.- traMADol (ULTRAM) 50 MG tablet; Take 1 tablet (50 mg total) by mouth every 6 (six) hours as needed.  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 Development worker, community has been used in this note in any air is in the dictation or transcription are unintentional.  Wilhemena Durie, MD  [place]

## 2017-03-30 ENCOUNTER — Telehealth: Payer: Self-pay | Admitting: Family Medicine

## 2017-03-30 NOTE — Telephone Encounter (Signed)
REF # [id number] - Deloxetine capsul 40 MG needing prior auth - answer additional questions - also sending fax with the questions

## 2017-03-31 NOTE — Telephone Encounter (Signed)
Working on this [employer], [place]

## 2017-04-04 ENCOUNTER — Telehealth: Payer: Self-pay

## 2017-04-04 NOTE — Telephone Encounter (Signed)
Duloxetine 40 mg denied per insurance due to not on formulary and patient has not tried or failed the following : Bupropion, Citalopram, Lexapro, Pristiq, Prozac, Mirtazapine, Paxil, Zoloft, Effexor, Trintellix, Viibryd (all generic is fine). Please Mackie Pai, RMA

## 2017-04-04 NOTE — Telephone Encounter (Signed)
Kept ringing no answer, unable to leave a [employer], RMA

## 2017-04-04 NOTE — Telephone Encounter (Signed)
Generic Effexor XR 75 mg daily

## 2017-04-05 NOTE — Telephone Encounter (Signed)
lmtcb-Merland Holness V Nikky Duba, RMA  

## 2017-04-06 MED ORDER — VENLAFAXINE HCL ER 75 MG PO CP24: 75.0000 mg | ORAL_CAPSULE | Freq: Every day | ORAL | 12 refills | Status: DC

## 2017-04-06 NOTE — Telephone Encounter (Signed)
Pt advised and new rx sent in-[name], RMA

## 2017-05-27 ENCOUNTER — Other Ambulatory Visit: Payer: Self-pay | Admitting: Family Medicine

## 2017-07-05 ENCOUNTER — Telehealth: Payer: Self-pay | Admitting: Family Medicine

## 2017-07-05 NOTE — Telephone Encounter (Signed)
Left message for patient to call back for a appt.

## 2017-07-09 ENCOUNTER — Telehealth: Payer: Self-pay | Admitting: Family Medicine

## 2017-07-09 NOTE — Telephone Encounter (Signed)
[facility] to [facility] * faxed to them on 11/16/16

## 2017-07-30 ENCOUNTER — Ambulatory Visit (INDEPENDENT_AMBULATORY_CARE_PROVIDER_SITE_OTHER): Payer: Medicare Other | Admitting: Family Medicine

## 2017-07-30 ENCOUNTER — Encounter: Payer: Self-pay | Admitting: Family Medicine

## 2017-07-30 VITALS — BP 128/72 | Temp 98.1°F | Resp 16 | Ht 66.0 in | Wt 250.0 lb

## 2017-07-30 DIAGNOSIS — F329 Major depressive disorder, single episode, unspecified: Secondary | ICD-10-CM

## 2017-07-30 DIAGNOSIS — M1990 Unspecified osteoarthritis, unspecified site: Secondary | ICD-10-CM

## 2017-07-30 DIAGNOSIS — R2681 Unsteadiness on feet: Secondary | ICD-10-CM

## 2017-07-30 DIAGNOSIS — M25551 Pain in right hip: Secondary | ICD-10-CM | POA: Diagnosis not present

## 2017-07-30 DIAGNOSIS — M150 Primary generalized (osteo)arthritis: Secondary | ICD-10-CM

## 2017-07-30 DIAGNOSIS — I10 Essential (primary) hypertension: Secondary | ICD-10-CM

## 2017-07-30 DIAGNOSIS — F32A Depression, unspecified: Secondary | ICD-10-CM

## 2017-07-30 DIAGNOSIS — M159 Polyosteoarthritis, unspecified: Secondary | ICD-10-CM

## 2017-07-30 MED ORDER — DULOXETINE HCL 30 MG PO CPEP: 30.0000 mg | ORAL_CAPSULE | Freq: Every day | ORAL | 3 refills | Status: DC

## 2017-07-30 NOTE — Progress Notes (Signed)
Patient: Lisa Paul Female    DOB: Dec 29, 1934   83 y.o.   MRN: [id number] Visit Date: 07/30/2017  Today's Provider: Wilhemena Durie, MD   Chief Complaint  Patient presents with  . Depression  . Osteoarthritis  . Memory Loss   Subjective:    HPI  Patient comes in today for a 4 month follow up.pt considering Assisted Living as she has had some MDD and MCI since the death of her husband. Mobility is also an issue She is trying to decide between here and [facility].  Depression- on 03/28/2017, no changes were made in her medications. She is currently taking Effexor.  Depression screen [facility] 2/9 07/30/2017 03/28/2017 10/26/2016  Decreased Interest 1 1 1   Down, Depressed, Hopeless 0 1 1  PHQ - 2 Score 1 2 2   Altered sleeping 1 2 2   Tired, decreased energy 1 2 1   Change in appetite 0 1 0  Feeling bad or failure about yourself  0 0 0  Trouble concentrating 0 0 0  Moving slowly or fidgety/restless 2 1 1   Suicidal thoughts 0 0 0  PHQ-9 Score 5 8 6   Difficult doing work/chores Somewhat difficult Somewhat difficult Somewhat difficult   Osteoarthritis- on 03/28/2017, patient was advised to discontinue Meloxicam and start Tramadol. Patient reports that she is still having pain in her right hip. She is requesting a cortisone injection to help relieve the pain.   Mild cognitive impairment- on 03/30/2017, no medications were changed. MMSE - Mini Mental State Exam 07/30/2017  Orientation to time 5  Orientation to Place 5  Registration 3  Attention/ Calculation 5  Recall 2  Language- name 2 objects 2  Language- repeat 1  Language- follow 3 step command 3  Language- read & follow direction 1  Write a sentence 1  Copy design 0  Total score 28       Allergies  Allergen Reactions  . Tape     Paper tape - blisters     Current Outpatient Medications:  .  acetaminophen (TYLENOL) 500 MG tablet, Take 2 tablets (1,000 mg total) by mouth every 8 (eight) hours as needed., Disp:  30 tablet, Rfl: 0 .  Cholecalciferol (VITAMIN D3) 2000 units capsule, Take 1 capsule (2,000 Units total) by mouth daily., Disp: , Rfl:  .  clotrimazole-betamethasone (LOTRISONE) cream, APPLY TO AFFECTED EAR ONCE OR TWICE WEEKLY AS NEEDED FOR SKIN IRRITATION, Disp: , Rfl: 6 .  Cyanocobalamin (B-12) 1000 MCG TABS, Take 1 tablet by mouth daily., Disp: 30 tablet, Rfl:  .  fluticasone (FLONASE) 50 MCG/ACT nasal spray, PLACE 2 SPRAYS INTO BOTH NOSTRILS DAILY., Disp: 16 g, Rfl: 11 .  gentamicin ointment (GARAMYCIN) 0.1 %, APPLY SMALL AMOUNT TO EACH NOSTRIL 2-3 TIMES DAILY FOR 10 DAYS. MAY REPEAT AS NEEDED., Disp: , Rfl: 2 .  losartan-hydrochlorothiazide (HYZAAR) 100-25 MG tablet, TAKE 1 TABLET BY MOUTH EVERY DAY, Disp: 90 tablet, Rfl: 3 .  meloxicam (MOBIC) 7.5 MG tablet, TAKE 1 TABLET (7.5 MG TOTAL) BY MOUTH DAILY., Disp: 30 tablet, Rfl: 5 .  mirabegron ER (MYRBETRIQ) 50 MG TB24 tablet, Take 1 tablet (50 mg total) by mouth daily., Disp: 90 tablet, Rfl: 3 .  Neomycin-Bacitracin Zn-Polymyx (NEO-POLYCIN OP), Apply to eye., Disp: , Rfl:  .  ranitidine (ZANTAC) 150 MG tablet, TAKE 1 TABLET (150 MG TOTAL) BY MOUTH AT BEDTIME., Disp: 30 tablet, Rfl: 12 .  traMADol (ULTRAM) 50 MG tablet, Take 1 tablet (50 mg total) by  mouth every 6 (six) hours as needed., Disp: 100 tablet, Rfl: 3 .  venlafaxine XR (EFFEXOR XR) 75 MG 24 hr capsule, Take 1 capsule (75 mg total) by mouth daily with breakfast., Disp: 30 capsule, Rfl: 12  Review of Systems  Constitutional: Negative.   HENT: Negative.   Eyes: Positive for discharge.       Sees eye doctor for this.  Respiratory: Negative.   Cardiovascular: Negative for chest pain, palpitations and leg swelling.  Gastrointestinal: Negative.   Endocrine: Negative.   Genitourinary: Negative.   Musculoskeletal: Positive for arthralgias.  Allergic/Immunologic: Negative.   Psychiatric/Behavioral: Negative.     Social History   Tobacco Use  . Smoking status: Never Smoker  .  Smokeless tobacco: Never Used  Substance Use Topics  . Alcohol use: No   Objective:   BP 128/72 (BP Location: Left Arm, Patient Position: Sitting, Cuff Size: Large)   Temp 98.1 F (36.7 C)   Resp 16   Ht 5\' 6"  (1.676 m)   Wt 250 lb (113.4 kg)   BMI 40.35 kg/m  Vitals:   07/30/17 1357  BP: 128/72  Resp: 16  Temp: 98.1 F (36.7 C)  Weight: 250 lb (113.4 kg)  Height: 5\' 6"  (1.676 m)     Physical Exam  Constitutional: She is oriented to person, place, and time. She appears well-developed and well-nourished.  Obese WF NAD.  HENT:  Head: Normocephalic and atraumatic.  Right Ear: External ear normal.  Left Ear: External ear normal.  Mouth/Throat: Oropharynx is clear and moist.  Eyes: Pupils are equal, round, and reactive to light. Conjunctivae are normal. Left eye exhibits no discharge. No scleral icterus.  Neck: No thyromegaly present.  Cardiovascular: Normal rate, regular rhythm and normal heart sounds.  Pulmonary/Chest: Effort normal and breath sounds normal.  Abdominal: Soft.  Musculoskeletal: She exhibits edema.  Trace edema  Lymphadenopathy:    She has no cervical adenopathy.  Neurological: She is alert and oriented to person, place, and time.  Skin: Skin is warm and dry.  Psychiatric: She has a normal mood and affect. Her behavior is normal. Judgment and thought content normal.        Assessment & Plan:     1. Depression, unspecified depression type Mild,chronic. RTC 2-3 months. - DULoxetine (CYMBALTA) 30 MG capsule; Take 1 capsule (30 mg total) by mouth daily.  Dispense: 30 capsule; Refill: 3 - CBC with Differential/Platelet  2. Essential hypertension  - Comprehensive metabolic panel - TSH  3. Arthritis   4. Right hip pain  - Ambulatory referral to Orthopedic Surgery 5.MCI 6.Obesity     I have done the exam and reviewed the above chart and it is accurate to the best of my knowledge. Development worker, community has been used in this note in any air is in  the dictation or transcription are unintentional.  Wilhemena Durie, MD  [place]

## 2017-07-31 ENCOUNTER — Telehealth: Payer: Self-pay

## 2017-07-31 LAB — CBC WITH DIFFERENTIAL/PLATELET
BASOS: 1 %
Basophils Absolute: 0 10*3/uL (ref 0.0–0.2)
EOS (ABSOLUTE): 0.1 10*3/uL (ref 0.0–0.4)
Eos: 1 %
Hematocrit: 41.4 % (ref 34.0–46.6)
Hemoglobin: 13.9 g/dL (ref 11.1–15.9)
IMMATURE GRANULOCYTES: 0 %
Immature Grans (Abs): 0 10*3/uL (ref 0.0–0.1)
Lymphocytes Absolute: 1.8 10*3/uL (ref 0.7–3.1)
Lymphs: 24 %
MCH: 30.8 pg (ref 26.6–33.0)
MCHC: 33.6 g/dL (ref 31.5–35.7)
MCV: 92 fL (ref 79–97)
MONOS ABS: 0.8 10*3/uL (ref 0.1–0.9)
Monocytes: 10 %
NEUTROS PCT: 64 %
Neutrophils Absolute: 4.8 10*3/uL (ref 1.4–7.0)
PLATELETS: 325 10*3/uL (ref 150–379)
RBC: 4.51 x10E6/uL (ref 3.77–5.28)
RDW: 14 % (ref 12.3–15.4)
WBC: 7.5 10*3/uL (ref 3.4–10.8)

## 2017-07-31 LAB — COMPREHENSIVE METABOLIC PANEL
ALK PHOS: 86 IU/L (ref 39–117)
ALT: 12 IU/L (ref 0–32)
AST: 15 IU/L (ref 0–40)
Albumin/Globulin Ratio: 1.3 (ref 1.2–2.2)
Albumin: 4 g/dL (ref 3.5–4.7)
BUN/Creatinine Ratio: 15 (ref 12–28)
BUN: 14 mg/dL (ref 8–27)
Bilirubin Total: 0.4 mg/dL (ref 0.0–1.2)
CO2: 21 mmol/L (ref 20–29)
Calcium: 9.6 mg/dL (ref 8.7–10.3)
Chloride: 102 mmol/L (ref 96–106)
Creatinine, Ser: 0.93 mg/dL (ref 0.57–1.00)
GFR calc Af Amer: 67 mL/min/{1.73_m2} (ref >59)
GFR calc non Af Amer: 58 mL/min/{1.73_m2} — ABNORMAL LOW (ref >59)
GLOBULIN, TOTAL: 3.1 g/dL (ref 1.5–4.5)
Glucose: 99 mg/dL (ref 65–99)
POTASSIUM: 4.3 mmol/L (ref 3.5–5.2)
SODIUM: 139 mmol/L (ref 134–144)
Total Protein: 7.1 g/dL (ref 6.0–8.5)

## 2017-07-31 LAB — TSH: TSH: 1.52 u[IU]/mL (ref 0.450–4.500)

## 2017-07-31 NOTE — Telephone Encounter (Signed)
Patient advised as below.  

## 2017-07-31 NOTE — Telephone Encounter (Signed)
-----   Message from Jerrol Banana., MD sent at 07/31/2017  2:25 PM EDT ----- Labs OK.

## 2017-08-01 ENCOUNTER — Telehealth: Payer: Self-pay | Admitting: Family Medicine

## 2017-08-01 NOTE — Telephone Encounter (Signed)
Patient's son states his mother missed a call from the office. Son states the nurse can return call and speak to him or his mom about the lab results. Thanks CC

## 2017-08-01 NOTE — Telephone Encounter (Signed)
Pt son advised.

## 2017-08-03 DIAGNOSIS — M160 Bilateral primary osteoarthritis of hip: Secondary | ICD-10-CM | POA: Diagnosis not present

## 2017-08-08 DIAGNOSIS — M1611 Unilateral primary osteoarthritis, right hip: Secondary | ICD-10-CM | POA: Diagnosis not present

## 2017-08-21 DIAGNOSIS — M1611 Unilateral primary osteoarthritis, right hip: Secondary | ICD-10-CM | POA: Diagnosis not present

## 2017-08-31 ENCOUNTER — Telehealth: Payer: Self-pay | Admitting: Family Medicine

## 2017-08-31 NOTE — Telephone Encounter (Signed)
Received [facility] medical clearance form. Placed form in providers box. Thanks CC

## 2017-09-03 ENCOUNTER — Ambulatory Visit: Payer: Self-pay | Admitting: Orthopedic Surgery

## 2017-09-04 NOTE — Telephone Encounter (Signed)
Appointment has been made for this.  Form is in basket between nurses desks in [facility].

## 2017-09-14 NOTE — Telephone Encounter (Signed)
App't for 09/19/17

## 2017-09-19 ENCOUNTER — Encounter: Payer: Self-pay | Admitting: Family Medicine

## 2017-09-19 ENCOUNTER — Other Ambulatory Visit: Payer: Self-pay

## 2017-09-19 ENCOUNTER — Encounter
Admission: RE | Admit: 2017-09-19 | Discharge: 2017-09-19 | Disposition: A | Discharge status: Home / Self Care | Payer: Medicare Other | Source: Ambulatory Visit | Attending: Orthopedic Surgery | Admitting: Orthopedic Surgery

## 2017-09-19 ENCOUNTER — Ambulatory Visit (INDEPENDENT_AMBULATORY_CARE_PROVIDER_SITE_OTHER): Payer: Medicare Other | Admitting: Family Medicine

## 2017-09-19 VITALS — BP 118/74 | HR 72 | Temp 98.6°F | Resp 16 | Ht 66.0 in | Wt 251.0 lb

## 2017-09-19 DIAGNOSIS — I10 Essential (primary) hypertension: Secondary | ICD-10-CM

## 2017-09-19 DIAGNOSIS — Z01818 Encounter for other preprocedural examination: Secondary | ICD-10-CM | POA: Insufficient documentation

## 2017-09-19 DIAGNOSIS — Z79899 Other long term (current) drug therapy: Secondary | ICD-10-CM | POA: Diagnosis not present

## 2017-09-19 DIAGNOSIS — M160 Bilateral primary osteoarthritis of hip: Secondary | ICD-10-CM | POA: Diagnosis not present

## 2017-09-19 DIAGNOSIS — M159 Polyosteoarthritis, unspecified: Secondary | ICD-10-CM

## 2017-09-19 DIAGNOSIS — M150 Primary generalized (osteo)arthritis: Secondary | ICD-10-CM

## 2017-09-19 DIAGNOSIS — Z791 Long term (current) use of non-steroidal anti-inflammatories (NSAID): Secondary | ICD-10-CM | POA: Insufficient documentation

## 2017-09-19 DIAGNOSIS — M1611 Unilateral primary osteoarthritis, right hip: Secondary | ICD-10-CM | POA: Diagnosis not present

## 2017-09-19 HISTORY — DX: Localized edema: R60.0

## 2017-09-19 HISTORY — DX: Unspecified urinary incontinence: R32

## 2017-09-19 HISTORY — DX: Sleep apnea, unspecified: G47.30

## 2017-09-19 HISTORY — DX: Dyspnea, unspecified: R06.00

## 2017-09-19 HISTORY — DX: Anxiety disorder, unspecified: F41.9

## 2017-09-19 HISTORY — DX: Polyneuropathy, unspecified: G62.9

## 2017-09-19 LAB — BASIC METABOLIC PANEL
Anion gap: 11 (ref 5–15)
BUN: 16 mg/dL (ref 6–20)
CO2: 24 mmol/L (ref 22–32)
Calcium: 9.3 mg/dL (ref 8.9–10.3)
Chloride: 103 mmol/L (ref 101–111)
Creatinine, Ser: 0.86 mg/dL (ref 0.44–1.00)
GFR calc non Af Amer: >60 mL/min (ref >60)
Glucose, Bld: 109 mg/dL — ABNORMAL HIGH (ref 65–99)
POTASSIUM: 3.6 mmol/L (ref 3.5–5.1)
SODIUM: 138 mmol/L (ref 135–145)

## 2017-09-19 LAB — CBC
HEMATOCRIT: 40.8 % (ref 35.0–47.0)
Hemoglobin: 14.5 g/dL (ref 12.0–16.0)
MCH: 32.6 pg (ref 26.0–34.0)
MCHC: 35.6 g/dL (ref 32.0–36.0)
MCV: 91.4 fL (ref 80.0–100.0)
Platelets: 297 10*3/uL (ref 150–440)
RBC: 4.46 MIL/uL (ref 3.80–5.20)
RDW: 14 % (ref 11.5–14.5)
WBC: 8.7 10*3/uL (ref 3.6–11.0)

## 2017-09-19 LAB — TYPE AND SCREEN
ABO/RH(D): O NEG
ANTIBODY SCREEN: NEGATIVE

## 2017-09-19 LAB — URINALYSIS, ROUTINE W REFLEX MICROSCOPIC
Bilirubin Urine: NEGATIVE
Glucose, UA: NEGATIVE mg/dL
Hgb urine dipstick: NEGATIVE
Ketones, ur: NEGATIVE mg/dL
LEUKOCYTES UA: NEGATIVE
NITRITE: NEGATIVE
PH: 5 (ref 5.0–8.0)
Protein, ur: NEGATIVE mg/dL
SPECIFIC GRAVITY, URINE: 1.02 (ref 1.005–1.030)

## 2017-09-19 LAB — SURGICAL PCR SCREEN
MRSA, PCR: NEGATIVE
STAPHYLOCOCCUS AUREUS: POSITIVE — AB

## 2017-09-19 LAB — PROTIME-INR
INR: 1
Prothrombin Time: 13.1 seconds (ref 11.4–15.2)

## 2017-09-19 LAB — APTT: aPTT: 32 seconds (ref 24–36)

## 2017-09-19 NOTE — Patient Instructions (Signed)
Your procedure is scheduled on: 10/03/17 Wed Report to Same Day Surgery 2nd floor medical mall [facility] Entrance-take elevator on left to 2nd floor.  Check in with surgery information desk.) To find out your arrival time please call [phone] between 1PM - 3PM on 10/02/17 Tues  Remember: Instructions that are not followed completely may result in serious medical risk, up to and including death, or upon the discretion of your surgeon and anesthesiologist your surgery may need to be rescheduled.    _x___ 1. Do not eat food after midnight the night before your procedure. You may drink clear liquids up to 2 hours before you are scheduled to arrive at the hospital for your procedure.  Do not drink clear liquids within 2 hours of your scheduled arrival to the hospital.  Clear liquids include  --Water or Apple juice without pulp  --Clear carbohydrate beverage such as ClearFast or Gatorade  --Black Coffee or Clear Tea (No milk, no creamers, do not add anything to                  the coffee or Tea Type 1 and type 2 diabetics should only drink water.  No gum chewing or hard candies.     __x__ 2. No Alcohol for 24 hours before or after surgery.   __x__3. No Smoking or e-cigarettes for 24 prior to surgery.  Do not use any chewable tobacco products for at least 6 hour prior to surgery   ____  4. Bring all medications with you on the day of surgery if instructed.    __x__ 5. Notify your doctor if there is any change in your medical condition     (cold, fever, infections).    x___6. On the morning of surgery brush your teeth with toothpaste and water.  You may rinse your mouth with mouth wash if you wish.  Do not swallow any toothpaste or mouthwash.   Do not wear jewelry, make-up, hairpins, clips or nail polish.  Do not wear lotions, powders, or perfumes. You may wear deodorant.  Do not shave 48 hours prior to surgery. Men may shave face and neck.  Do not bring valuables to the hospital.     [facility] is not responsible for any belongings or valuables.               Contacts, dentures or bridgework may not be worn into surgery.  Leave your suitcase in the car. After surgery it may be brought to your room.  For patients admitted to the hospital, discharge time is determined by your                       treatment team.  _  Patients discharged the day of surgery will not be allowed to drive home.  You will need someone to drive you home and stay with you the night of your procedure.    Please read over the following fact sheets that you were given:   [facility] Preparing for Surgery and or MRSA Information   _x___ Take anti-hypertensive listed below, cardiac, seizure, asthma,     anti-reflux and psychiatric medicines. These include:  1. ranitidine (ZANTAC) 150 MG tablet  2.traMADol (ULTRAM) 50 MG tablet if needed  3.DULoxetine (CYMBALTA) 30 MG capsule  4.  5.  6.  ____Fleets enema or Magnesium Citrate as directed.   _x___ Use CHG Soap or sage wipes as directed on instruction sheet   ____ Use  inhalers on the day of surgery and bring to hospital day of surgery  ____ Stop Metformin and Janumet 2 days prior to surgery.    ____ Take 1/2 of usual insulin dose the night before surgery and none on the morning     surgery.   _x___ Follow recommendations from Cardiologist, Pulmonologist or PCP regarding          stopping Aspirin, Coumadin, Plavix ,Eliquis, Effient, or Pradaxa, and Pletal.  X____Stop Anti-inflammatories such as Advil, Aleve, Ibuprofen, Motrin, Naproxen, Naprosyn, Goodies powders or aspirin products. OK to take Tylenol and                          Celebrex. Do not take meloxicam within a week of surgery   _x___ Stop supplements until after surgery.  But may continue Vitamin D, Vitamin B,       and multivitamin.   ____ Bring C-Pap to the hospital.

## 2017-09-19 NOTE — Pre-Procedure Instructions (Signed)
Instructed in use of incentive spirometry.

## 2017-09-19 NOTE — Progress Notes (Signed)
Patient: Lisa Paul Female    DOB: [birth year]   83 y.o.   MRN: [id number] Visit Date: 09/19/2017  Today's Provider: Wilhemena Durie, MD   Chief Complaint  Patient presents with  . Pre-op Exam   Subjective:    HPI Patient is here for surgical clearance. She is having Total hip Arthroplasty Anterior approach surgery on 10/03/2017 by Dr. Harlow Mares at [facility]. No concerns about the surgery. She does not smoke or drinks alcohol. No cardiac or neurologic symptoms at all.    Allergies  Allergen Reactions  . Tape     Paper tape - blisters     Current Outpatient Medications:  .  acetaminophen (TYLENOL) 500 MG tablet, Take 2 tablets (1,000 mg total) by mouth every 8 (eight) hours as needed. (Patient taking differently: Take 500 mg by mouth every 8 (eight) hours as needed (for pain. (with tramadol)). ), Disp: 30 tablet, Rfl: 0 .  Cyanocobalamin (B-12) 1000 MCG TABS, Take 1 tablet by mouth daily. (Patient taking differently: Take 1,000 mcg by mouth daily. ), Disp: 30 tablet, Rfl:  .  diphenhydrAMINE (BENADRYL) 25 MG tablet, Take 25 mg by mouth at bedtime as needed (for sleep & itching.)., Disp: , Rfl:  .  DULoxetine (CYMBALTA) 30 MG capsule, Take 1 capsule (30 mg total) by mouth daily., Disp: 30 capsule, Rfl: 3 .  fluticasone (FLONASE) 50 MCG/ACT nasal spray, PLACE 2 SPRAYS INTO BOTH NOSTRILS DAILY. (Patient taking differently: Place 2 sprays into both nostrils See admin instructions. Use 1 spray in each nostril scheduled in the morning, may repeat dose in the evening), Disp: 16 g, Rfl: 11 .  losartan-hydrochlorothiazide (HYZAAR) 100-25 MG tablet, TAKE 1 TABLET BY MOUTH EVERY DAY, Disp: 90 tablet, Rfl: 3 .  meloxicam (MOBIC) 7.5 MG tablet, TAKE 1 TABLET (7.5 MG TOTAL) BY MOUTH DAILY. (Patient not taking: Reported on 09/17/2017), Disp: 30 tablet, Rfl: 5 .  mirabegron ER (MYRBETRIQ) 50 MG TB24 tablet, Take 1 tablet (50 mg total) by mouth daily., Disp: 90 tablet, Rfl: 3 .  Multiple  Vitamin (MULTIVITAMIN WITH MINERALS) TABS tablet, Take 1 tablet by mouth daily. Women's Daily Multivitamin, Disp: , Rfl:  .  Polyethyl Glycol-Propyl Glycol (LUBRICANT EYE DROPS) 0.4-0.3 % SOLN, Place 1 drop into both eyes 2 (two) times daily as needed (for dry eyes.)., Disp: , Rfl:  .  ranitidine (ZANTAC) 150 MG tablet, TAKE 1 TABLET (150 MG TOTAL) BY MOUTH AT BEDTIME. (Patient taking differently: Take 150 mg by mouth See admin instructions. Take 1 tablet (150 mg) by mouth scheduled in the morning, may repeat dose in the evening if needed for acid reflux or indigestion.), Disp: 30 tablet, Rfl: 12 .  traMADol (ULTRAM) 50 MG tablet, Take 1 tablet (50 mg total) by mouth every 6 (six) hours as needed. (Patient taking differently: Take 50 mg by mouth every 6 (six) hours as needed (for pain.). ), Disp: 100 tablet, Rfl: 3 .  venlafaxine XR (EFFEXOR XR) 75 MG 24 hr capsule, Take 1 capsule (75 mg total) by mouth daily with breakfast. (Patient not taking: Reported on 09/17/2017), Disp: 30 capsule, Rfl: 12  Review of Systems  Constitutional: Negative.   HENT: Negative.   Eyes: Negative.   Respiratory: Negative for cough, shortness of breath and wheezing.   Cardiovascular: Negative for chest pain, palpitations and leg swelling.  Gastrointestinal: Negative.   Endocrine: Negative.   Musculoskeletal: Positive for arthralgias, back pain and myalgias.  Skin: Negative.  Allergic/Immunologic: Negative.   Neurological: Negative for dizziness and headaches.  Hematological: Negative.   Psychiatric/Behavioral: Negative.     Social History   Tobacco Use  . Smoking status: Never Smoker  . Smokeless tobacco: Never Used  Substance Use Topics  . Alcohol use: No   Objective:   BP 118/74 (BP Location: Right Arm, Patient Position: Sitting, Cuff Size: Large)   Pulse 72   Temp 98.6 F (37 C)   Resp 16   Ht 5\' 6"  (1.676 m)   Wt 251 lb (113.9 kg)   SpO2 98%   BMI 40.51 kg/m    Physical Exam  Constitutional:  She is oriented to person, place, and time. She appears well-developed and well-nourished.  Obese WF NAD.  HENT:  Head: Normocephalic and atraumatic.  Right Ear: External ear normal.  Left Ear: External ear normal.  Nose: Nose normal.  Mouth/Throat: Oropharynx is clear and moist.  Eyes: Conjunctivae are normal. No scleral icterus.  Neck: No thyromegaly present.  Cardiovascular: Normal rate, regular rhythm and normal heart sounds.  Pulmonary/Chest: Effort normal and breath sounds normal.  Abdominal: Soft.  Musculoskeletal: She exhibits no edema.  Neurological: She is alert and oriented to person, place, and time.  Skin: Skin is warm and dry.  Psychiatric: She has a normal mood and affect. Her behavior is normal. Judgment and thought content normal.        Assessment & Plan:     1. Preoperative clearance Pt cleared for surgery. THR. 2.Obesity Pt moving to [place] with her son who lives there. She makes it clear today that her son [name] and not her son Merrily Pew has [place] and Durable POA. She is mentally completely competent and able to make this decision. More than 50% of 25 minute visit spent in counseling. 3.Breast Cancer 4.HTN 5.Mild Depression Improved.      I have done the exam and reviewed the above chart and it is accurate to the best of my knowledge. Development worker, community has been used in this note in any air is in the dictation or transcription are unintentional.  Wilhemena Durie, MD  [place]

## 2017-10-02 MED ORDER — TRANEXAMIC ACID 1000 MG/10ML IV SOLN
1000.0000 mg | INTRAVENOUS | Status: AC
  Administered 2017-10-03: 1000 mg via INTRAVENOUS
  Filled 2017-10-02: qty 10

## 2017-10-02 MED ORDER — CEFAZOLIN SODIUM-DEXTROSE 2-4 GM/100ML-% IV SOLN
2.0000 g | INTRAVENOUS | Status: AC
  Administered 2017-10-03: 2 g via INTRAVENOUS

## 2017-10-03 ENCOUNTER — Inpatient Hospital Stay: Payer: Medicare Other

## 2017-10-03 ENCOUNTER — Other Ambulatory Visit: Payer: Self-pay

## 2017-10-03 ENCOUNTER — Inpatient Hospital Stay: Payer: Medicare Other | Admitting: Anesthesiology

## 2017-10-03 ENCOUNTER — Encounter
Admission: RE | Disposition: A | Discharge status: Home / Self Care | Payer: Self-pay | Source: Ambulatory Visit | Attending: Orthopedic Surgery

## 2017-10-03 ENCOUNTER — Inpatient Hospital Stay
Admission: RE | Admit: 2017-10-03 | Discharge: 2017-10-06 | DRG: 470 | Disposition: A | Discharge status: Home / Self Care | Payer: Medicare Other | Source: Ambulatory Visit | Attending: Orthopedic Surgery | Admitting: Orthopedic Surgery

## 2017-10-03 DIAGNOSIS — K219 Gastro-esophageal reflux disease without esophagitis: Secondary | ICD-10-CM | POA: Diagnosis present

## 2017-10-03 DIAGNOSIS — Z853 Personal history of malignant neoplasm of breast: Secondary | ICD-10-CM | POA: Diagnosis not present

## 2017-10-03 DIAGNOSIS — I10 Essential (primary) hypertension: Secondary | ICD-10-CM | POA: Diagnosis present

## 2017-10-03 DIAGNOSIS — G4730 Sleep apnea, unspecified: Secondary | ICD-10-CM | POA: Diagnosis not present

## 2017-10-03 DIAGNOSIS — Z471 Aftercare following joint replacement surgery: Secondary | ICD-10-CM | POA: Diagnosis not present

## 2017-10-03 DIAGNOSIS — Z96641 Presence of right artificial hip joint: Secondary | ICD-10-CM | POA: Diagnosis not present

## 2017-10-03 DIAGNOSIS — Z09 Encounter for follow-up examination after completed treatment for conditions other than malignant neoplasm: Secondary | ICD-10-CM

## 2017-10-03 DIAGNOSIS — M1611 Unilateral primary osteoarthritis, right hip: Principal | ICD-10-CM | POA: Diagnosis present

## 2017-10-03 DIAGNOSIS — Z419 Encounter for procedure for purposes other than remedying health state, unspecified: Secondary | ICD-10-CM

## 2017-10-03 DIAGNOSIS — F418 Other specified anxiety disorders: Secondary | ICD-10-CM | POA: Diagnosis not present

## 2017-10-03 HISTORY — PX: TOTAL HIP ARTHROPLASTY: SHX124

## 2017-10-03 LAB — ABO/RH: ABO/RH(D): O NEG

## 2017-10-03 SURGERY — ARTHROPLASTY, HIP, TOTAL, ANTERIOR APPROACH
Anesthesia: Spinal | Site: Hip | Laterality: Right | Wound class: Clean

## 2017-10-03 MED ORDER — PROPOFOL 500 MG/50ML IV EMUL
INTRAVENOUS | Status: AC
  Filled 2017-10-03: qty 50

## 2017-10-03 MED ORDER — LACTATED RINGERS IV SOLN: INTRAVENOUS | Status: DC

## 2017-10-03 MED ORDER — POLYVINYL ALCOHOL 1.4 % OP SOLN
1.0000 [drp] | Freq: Two times a day (BID) | OPHTHALMIC | Status: DC | PRN
  Filled 2017-10-03: qty 15

## 2017-10-03 MED ORDER — CEFAZOLIN SODIUM-DEXTROSE 2-4 GM/100ML-% IV SOLN
INTRAVENOUS | Status: AC
  Filled 2017-10-03: qty 100

## 2017-10-03 MED ORDER — SODIUM CHLORIDE 0.9 % IR SOLN
Status: DC | PRN
  Administered 2017-10-03: 2000 mL

## 2017-10-03 MED ORDER — MIDAZOLAM HCL 5 MG/5ML IJ SOLN
INTRAMUSCULAR | Status: DC | PRN
  Administered 2017-10-03: 2 mg via INTRAVENOUS

## 2017-10-03 MED ORDER — DOCUSATE SODIUM 100 MG PO CAPS
100.0000 mg | ORAL_CAPSULE | Freq: Two times a day (BID) | ORAL | Status: DC
  Administered 2017-10-03 – 2017-10-05 (×5): 100 mg via ORAL
  Filled 2017-10-03 (×5): qty 1

## 2017-10-03 MED ORDER — BUPIVACAINE-EPINEPHRINE (PF) 0.25% -1:200000 IJ SOLN
INTRAMUSCULAR | Status: DC | PRN
  Administered 2017-10-03: 30 mL via PERINEURAL

## 2017-10-03 MED ORDER — ONDANSETRON HCL 4 MG PO TABS: 4.0000 mg | ORAL_TABLET | Freq: Four times a day (QID) | ORAL | Status: DC | PRN

## 2017-10-03 MED ORDER — DULOXETINE HCL 30 MG PO CPEP
30.0000 mg | ORAL_CAPSULE | Freq: Every day | ORAL | Status: DC
  Filled 2017-10-03 (×4): qty 1

## 2017-10-03 MED ORDER — CHLORHEXIDINE GLUCONATE 4 % EX LIQD: 60.0000 mL | Freq: Once | CUTANEOUS | Status: DC

## 2017-10-03 MED ORDER — MAGNESIUM CITRATE PO SOLN
1.0000 | Freq: Once | ORAL | Status: DC | PRN
  Filled 2017-10-03: qty 296

## 2017-10-03 MED ORDER — ACETAMINOPHEN 500 MG PO TABS
1000.0000 mg | ORAL_TABLET | Freq: Once | ORAL | Status: AC
  Administered 2017-10-03: 1000 mg via ORAL

## 2017-10-03 MED ORDER — OXYCODONE HCL 5 MG PO TABS: 5.0000 mg | ORAL_TABLET | Freq: Once | ORAL | Status: DC | PRN

## 2017-10-03 MED ORDER — VENLAFAXINE HCL ER 75 MG PO CP24: 75.0000 mg | ORAL_CAPSULE | Freq: Every day | ORAL | Status: DC

## 2017-10-03 MED ORDER — GABAPENTIN 300 MG PO CAPS
ORAL_CAPSULE | ORAL | Status: AC
  Administered 2017-10-03: 300 mg via ORAL
  Filled 2017-10-03: qty 1

## 2017-10-03 MED ORDER — LOSARTAN POTASSIUM-HCTZ 100-25 MG PO TABS: 1.0000 | ORAL_TABLET | Freq: Every day | ORAL | Status: DC

## 2017-10-03 MED ORDER — MIDAZOLAM HCL 2 MG/2ML IJ SOLN
INTRAMUSCULAR | Status: AC
  Filled 2017-10-03: qty 2

## 2017-10-03 MED ORDER — LACTATED RINGERS IV SOLN
INTRAVENOUS | Status: DC
  Administered 2017-10-03 (×2): via INTRAVENOUS

## 2017-10-03 MED ORDER — BISACODYL 10 MG RE SUPP
10.0000 mg | Freq: Every day | RECTAL | Status: DC | PRN
Start: 2017-10-03 — End: 2017-10-06
  Administered 2017-10-06: 10 mg via RECTAL
  Filled 2017-10-03: qty 1

## 2017-10-03 MED ORDER — PHENYLEPHRINE HCL 10 MG/ML IJ SOLN
INTRAMUSCULAR | Status: DC | PRN
  Administered 2017-10-03: 15 ug/min via INTRAVENOUS

## 2017-10-03 MED ORDER — ACETAMINOPHEN 325 MG PO TABS: 325.0000 mg | ORAL_TABLET | Freq: Four times a day (QID) | ORAL | Status: DC | PRN

## 2017-10-03 MED ORDER — ACETAMINOPHEN 500 MG PO TABS
ORAL_TABLET | ORAL | Status: AC
  Administered 2017-10-03: 1000 mg via ORAL
  Filled 2017-10-03: qty 2

## 2017-10-03 MED ORDER — ASPIRIN 81 MG PO CHEW
81.0000 mg | CHEWABLE_TABLET | Freq: Two times a day (BID) | ORAL | Status: DC
  Administered 2017-10-03 – 2017-10-06 (×6): 81 mg via ORAL
  Filled 2017-10-03 (×5): qty 1

## 2017-10-03 MED ORDER — HYDROCODONE-ACETAMINOPHEN 5-325 MG PO TABS
1.0000 | ORAL_TABLET | ORAL | Status: DC | PRN
  Administered 2017-10-03: 1 via ORAL
  Administered 2017-10-03: 2 via ORAL
  Administered 2017-10-03 – 2017-10-04 (×2): 1 via ORAL
  Administered 2017-10-04: 2 via ORAL
  Administered 2017-10-04: 1 via ORAL
  Administered 2017-10-04: 2 via ORAL
  Administered 2017-10-04 – 2017-10-05 (×2): 1 via ORAL
  Administered 2017-10-05: 2 via ORAL
  Administered 2017-10-06: 1 via ORAL
  Filled 2017-10-03 (×2): qty 1
  Filled 2017-10-03 (×2): qty 2
  Filled 2017-10-03: qty 1
  Filled 2017-10-03: qty 2
  Filled 2017-10-03: qty 1
  Filled 2017-10-03: qty 2
  Filled 2017-10-03 (×2): qty 1
  Filled 2017-10-03: qty 2

## 2017-10-03 MED ORDER — FENTANYL CITRATE (PF) 100 MCG/2ML IJ SOLN
INTRAMUSCULAR | Status: AC
  Administered 2017-10-03: 25 ug via INTRAVENOUS
  Filled 2017-10-03: qty 2

## 2017-10-03 MED ORDER — POLYETHYLENE GLYCOL 3350 17 G PO PACK
17.0000 g | PACK | Freq: Every day | ORAL | Status: DC | PRN
  Administered 2017-10-05: 17 g via ORAL
  Filled 2017-10-03: qty 1

## 2017-10-03 MED ORDER — ACETAMINOPHEN 500 MG PO TABS
500.0000 mg | ORAL_TABLET | Freq: Four times a day (QID) | ORAL | Status: AC
  Administered 2017-10-03 – 2017-10-04 (×4): 500 mg via ORAL
  Filled 2017-10-03 (×4): qty 1

## 2017-10-03 MED ORDER — MORPHINE SULFATE (PF) 2 MG/ML IV SOLN: 0.5000 mg | INTRAVENOUS | Status: DC | PRN

## 2017-10-03 MED ORDER — PHENOL 1.4 % MT LIQD
1.0000 | OROMUCOSAL | Status: DC | PRN
  Filled 2017-10-03: qty 177

## 2017-10-03 MED ORDER — SODIUM CHLORIDE FLUSH 0.9 % IV SOLN
INTRAVENOUS | Status: AC
  Filled 2017-10-03: qty 20

## 2017-10-03 MED ORDER — LOSARTAN POTASSIUM 50 MG PO TABS
100.0000 mg | ORAL_TABLET | Freq: Every day | ORAL | Status: DC
  Administered 2017-10-04 – 2017-10-06 (×3): 100 mg via ORAL
  Filled 2017-10-03 (×3): qty 2

## 2017-10-03 MED ORDER — MIRABEGRON ER 50 MG PO TB24
50.0000 mg | ORAL_TABLET | Freq: Every day | ORAL | Status: DC
  Administered 2017-10-04 – 2017-10-06 (×3): 50 mg via ORAL
  Filled 2017-10-03 (×4): qty 1

## 2017-10-03 MED ORDER — BUPIVACAINE LIPOSOME 1.3 % IJ SUSP
20.0000 mL | Freq: Once | INTRAMUSCULAR | Status: DC
  Filled 2017-10-03: qty 20

## 2017-10-03 MED ORDER — METOCLOPRAMIDE HCL 10 MG PO TABS: 5.0000 mg | ORAL_TABLET | Freq: Three times a day (TID) | ORAL | Status: DC | PRN

## 2017-10-03 MED ORDER — HYDROCODONE-ACETAMINOPHEN 7.5-325 MG PO TABS: 1.0000 | ORAL_TABLET | ORAL | Status: DC | PRN

## 2017-10-03 MED ORDER — FAMOTIDINE 20 MG PO TABS
10.0000 mg | ORAL_TABLET | Freq: Every day | ORAL | Status: DC
  Administered 2017-10-03 – 2017-10-05 (×3): 10 mg via ORAL
  Filled 2017-10-03 (×3): qty 1

## 2017-10-03 MED ORDER — MENTHOL 3 MG MT LOZG
1.0000 | LOZENGE | OROMUCOSAL | Status: DC | PRN
  Filled 2017-10-03: qty 9

## 2017-10-03 MED ORDER — BUPIVACAINE HCL (PF) 0.5 % IJ SOLN
INTRAMUSCULAR | Status: DC | PRN
  Administered 2017-10-03: 3 mL

## 2017-10-03 MED ORDER — BUPIVACAINE-EPINEPHRINE (PF) 0.25% -1:200000 IJ SOLN
INTRAMUSCULAR | Status: AC
  Filled 2017-10-03: qty 30

## 2017-10-03 MED ORDER — FLUTICASONE PROPIONATE 50 MCG/ACT NA SUSP
2.0000 | Freq: Every day | NASAL | Status: DC
  Administered 2017-10-04 – 2017-10-06 (×3): 2 via NASAL
  Filled 2017-10-03: qty 16

## 2017-10-03 MED ORDER — ONDANSETRON HCL 4 MG/2ML IJ SOLN: 4.0000 mg | Freq: Four times a day (QID) | INTRAMUSCULAR | Status: DC | PRN

## 2017-10-03 MED ORDER — GABAPENTIN 300 MG PO CAPS
300.0000 mg | ORAL_CAPSULE | Freq: Once | ORAL | Status: AC
  Administered 2017-10-03: 300 mg via ORAL

## 2017-10-03 MED ORDER — KETOROLAC TROMETHAMINE 15 MG/ML IJ SOLN
7.5000 mg | Freq: Four times a day (QID) | INTRAMUSCULAR | Status: AC
  Administered 2017-10-03 – 2017-10-04 (×4): 7.5 mg via INTRAVENOUS
  Filled 2017-10-03 (×4): qty 1

## 2017-10-03 MED ORDER — FENTANYL CITRATE (PF) 100 MCG/2ML IJ SOLN
25.0000 ug | INTRAMUSCULAR | Status: DC | PRN
  Administered 2017-10-03 (×2): 25 ug via INTRAVENOUS

## 2017-10-03 MED ORDER — CEFAZOLIN SODIUM-DEXTROSE 2-4 GM/100ML-% IV SOLN
2.0000 g | Freq: Four times a day (QID) | INTRAVENOUS | Status: AC
  Administered 2017-10-03 (×2): 2 g via INTRAVENOUS
  Filled 2017-10-03 (×3): qty 100

## 2017-10-03 MED ORDER — METOCLOPRAMIDE HCL 5 MG/ML IJ SOLN: 5.0000 mg | Freq: Three times a day (TID) | INTRAMUSCULAR | Status: DC | PRN

## 2017-10-03 MED ORDER — PROPOFOL 500 MG/50ML IV EMUL
INTRAVENOUS | Status: DC | PRN
  Administered 2017-10-03: 125 ug/kg/min via INTRAVENOUS

## 2017-10-03 MED ORDER — LACTATED RINGERS IV SOLN
INTRAVENOUS | Status: DC
  Administered 2017-10-03 (×2): via INTRAVENOUS

## 2017-10-03 MED ORDER — OXYCODONE HCL 5 MG/5ML PO SOLN: 5.0000 mg | Freq: Once | ORAL | Status: DC | PRN

## 2017-10-03 MED ORDER — HYDROCHLOROTHIAZIDE 25 MG PO TABS
25.0000 mg | ORAL_TABLET | Freq: Every day | ORAL | Status: DC
  Administered 2017-10-04 – 2017-10-06 (×3): 25 mg via ORAL
  Filled 2017-10-03 (×3): qty 1

## 2017-10-03 MED ORDER — GABAPENTIN 300 MG PO CAPS
300.0000 mg | ORAL_CAPSULE | Freq: Three times a day (TID) | ORAL | Status: DC
  Administered 2017-10-03 – 2017-10-06 (×8): 300 mg via ORAL
  Filled 2017-10-03 (×8): qty 1

## 2017-10-03 MED ORDER — PROPOFOL 10 MG/ML IV BOLUS
INTRAVENOUS | Status: DC | PRN
  Administered 2017-10-03: 50 mg via INTRAVENOUS

## 2017-10-03 MED ORDER — BACITRACIN 50000 UNITS IM SOLR
INTRAMUSCULAR | Status: AC
  Filled 2017-10-03: qty 2

## 2017-10-03 SURGICAL SUPPLY — 47 items
BLADE SAGITTAL WIDE XTHICK NO (BLADE) ×3 IMPLANT
BRUSH SCRUB EZ  4% CHG (MISCELLANEOUS) ×2
BRUSH SCRUB EZ 4% CHG (MISCELLANEOUS) ×2 IMPLANT
CHLORAPREP W/TINT 26ML (MISCELLANEOUS) ×3 IMPLANT
COVER HOLE (Hips) ×2 IMPLANT
CUP R3 52MM (Hips) ×2 IMPLANT
DRAPE C-ARM 42X72 X-RAY (DRAPES) ×3 IMPLANT
DRAPE SHEET LG 3/4 BI-LAMINATE (DRAPES) ×6 IMPLANT
DRAPE STERI IOBAN 125X83 (DRAPES) ×3 IMPLANT
DRSG AQUACEL AG ADV 3.5X10 (GAUZE/BANDAGES/DRESSINGS) ×1 IMPLANT
DRSG AQUACEL AG ADV 3.5X14 (GAUZE/BANDAGES/DRESSINGS) ×3 IMPLANT
ELECT BLADE 6.5 EXT (BLADE) ×3 IMPLANT
ELECT REM PT RETURN 9FT ADLT (ELECTROSURGICAL) ×3
ELECTRODE REM PT RTRN 9FT ADLT (ELECTROSURGICAL) ×1 IMPLANT
GAUZE PETRO XEROFOAM 1X8 (MISCELLANEOUS) ×3 IMPLANT
GLOVE INDICATOR 8.0 STRL GRN (GLOVE) ×3 IMPLANT
GLOVE SURG ORTHO 8.0 STRL STRW (GLOVE) ×3 IMPLANT
GOWN STRL REUS W/ TWL LRG LVL3 (GOWN DISPOSABLE) ×2 IMPLANT
GOWN STRL REUS W/ TWL XL LVL3 (GOWN DISPOSABLE) ×1 IMPLANT
GOWN STRL REUS W/TWL LRG LVL3 (GOWN DISPOSABLE) ×6
GOWN STRL REUS W/TWL XL LVL3 (GOWN DISPOSABLE) ×3
HEAD FEM KNEE TAPER 36MM XS-3 (Head) ×2 IMPLANT
HOOD PEEL AWAY FLYTE STAYCOOL (MISCELLANEOUS) ×9 IMPLANT
IV NS 1000ML (IV SOLUTION) ×3
IV NS 1000ML BAXH (IV SOLUTION) ×1 IMPLANT
KIT PATIENT CARE HANA TABLE (KITS) ×3 IMPLANT
KIT TURNOVER CYSTO (KITS) ×3 IMPLANT
LINER ACETAB 0 DEG (Liner) ×2 IMPLANT
MAT BLUE FLOOR 46X72 FLO (MISCELLANEOUS) ×3 IMPLANT
NDL SAFETY ECLIPSE 18X1.5 (NEEDLE) ×2 IMPLANT
NDL SPNL 20GX3.5 QUINCKE YW (NEEDLE) ×1 IMPLANT
NEEDLE HYPO 18GX1.5 SHARP (NEEDLE) ×6
NEEDLE HYPO 22GX1.5 SAFETY (NEEDLE) ×3 IMPLANT
NEEDLE SPNL 20GX3.5 QUINCKE YW (NEEDLE) ×3 IMPLANT
PACK HIP PROSTHESIS (MISCELLANEOUS) ×3 IMPLANT
PADDING CAST BLEND 4X4 NS (MISCELLANEOUS) ×6 IMPLANT
PILLOW ABDUCTION MEDIUM (MISCELLANEOUS) ×3 IMPLANT
PULSAVAC PLUS IRRIG FAN TIP (DISPOSABLE) ×3
SCREW 6.5X25MM (Screw) ×2 IMPLANT
STAPLER SKIN PROX 35W (STAPLE) ×3 IMPLANT
STEM STD COLLAR SZ3 POLARSTEM (Stem) ×2 IMPLANT
SUT BONE WAX W31G (SUTURE) ×3 IMPLANT
SUT DVC 2 QUILL PDO  T11 36X36 (SUTURE) ×2
SUT DVC 2 QUILL PDO T11 36X36 (SUTURE) ×1 IMPLANT
SUT VIC AB 2-0 CT1 18 (SUTURE) ×3 IMPLANT
SYR 20CC LL (SYRINGE) ×3 IMPLANT
TIP FAN IRRIG PULSAVAC PLUS (DISPOSABLE) ×1 IMPLANT

## 2017-10-03 NOTE — Anesthesia Post-op Follow-up Note (Signed)
Anesthesia QCDR form completed.        

## 2017-10-03 NOTE — H&P (Signed)
The patient has been re-examined, and the chart reviewed, and there have been no interval changes to the documented history and physical.  Plan a right total hip replacement today.  Anesthesia is not consulted regarding a peripheral nerve block for post-operative pain.  The risks, benefits, and alternatives have been discussed at length, and the patient is willing to proceed.     

## 2017-10-03 NOTE — Care Management (Signed)
Per Alinda Money, RN , Bundle case manager, patient has outpatient PT set up in [place] to begin 7/9.

## 2017-10-03 NOTE — NC FL2 (Signed)
[place] LEVEL OF CARE SCREENING TOOL     IDENTIFICATION  Patient Name: Lisa Paul Birthdate: 14-Jul-1934 Sex: female Admission Date (Current Location): 10/03/2017  [place] and [place] Number:  Engineering geologist and Address:  [facility], [address], [place], [place] [address]      Provider Number: [id number]  Attending Physician Name and Address:  Lovell Sheehan, MD  Relative Name and Phone Number:       Current Level of Care: Hospital Recommended Level of Care: [place] Prior Approval Number:    Date Approved/Denied:   PASRR Number: [id number])  Discharge Plan: SNF    Current Diagnoses: Patient Active Problem List   Diagnosis Date Noted  . Osteoarthritis of right hip 10/03/2017  . B12 deficiency 11/06/2016  . Gait instability 11/03/2016  . Depression 11/03/2016  . Urge incontinence 11/03/2016  . OCD (obsessive compulsive disorder) 11/03/2016  . Allergic rhinitis 09/10/2014  . Acid reflux 09/10/2014  . Auditory impairment 09/10/2014  . BP (high blood pressure) 09/10/2014  . Obesity, morbid, BMI 40.0-49.9 ([place]) 09/10/2014  . Arthritis, degenerative 09/10/2014  . Restless legs syndrome 09/10/2014  . History of sleep apnea 09/10/2014  . Vitamin D deficiency 09/10/2014  . History of breast cancer 10/23/2012    Orientation RESPIRATION BLADDER Height & Weight     Self, Time, Situation, Place  Normal Continent Weight: 251 lb (113.9 kg) Height:  5\' 5"  (165.1 cm)  BEHAVIORAL SYMPTOMS/MOOD NEUROLOGICAL BOWEL NUTRITION STATUS      Continent Diet(Diet: Clear Liquid to be Advanced. )  AMBULATORY STATUS COMMUNICATION OF NEEDS Skin   Extensive Assist Verbally Surgical wounds(Incision: Right Hip. )                       Personal Care Assistance Level of Assistance  Bathing, Feeding, Dressing Bathing Assistance: Limited assistance Feeding assistance: Independent Dressing Assistance: Limited  assistance     Functional Limitations Info  Sight, Hearing, Speech Sight Info: Adequate Hearing Info: Impaired Speech Info: Adequate    SPECIAL CARE FACTORS FREQUENCY  PT (By licensed PT), OT (By licensed OT)     PT Frequency: (5) OT Frequency: (5)            Contractures      Additional Factors Info  Code Status, Allergies Code Status Info: (Full Code. ) Allergies Info: (Tape)           Current Medications (10/03/2017):  This is the current hospital active medication list Current Facility-Administered Medications  Medication Dose Route Frequency Provider Last Rate Last Dose  . [START ON 10/04/2017] acetaminophen (TYLENOL) tablet 325-650 mg  325-650 mg Oral Q6H PRN Lovell Sheehan, MD      . acetaminophen (TYLENOL) tablet 500 mg  500 mg Oral Q6H Lovell Sheehan, MD   500 mg at 10/03/17 1159  . aspirin chewable tablet 81 mg  81 mg Oral BID Lovell Sheehan, MD      . bisacodyl (DULCOLAX) suppository 10 mg  10 mg Rectal Daily PRN Lovell Sheehan, MD      . ceFAZolin (ANCEF) IVPB 2g/100 mL premix  2 g Intravenous Q6H Lovell Sheehan, MD 200 mL/hr at 10/03/17 1443 2 g at 10/03/17 1443  . docusate sodium (COLACE) capsule 100 mg  100 mg Oral BID Lovell Sheehan, MD      . DULoxetine (CYMBALTA) DR capsule 30 mg  30 mg Oral Daily Lovell Sheehan, MD      .  famotidine (PEPCID) tablet 10 mg  10 mg Oral QHS Lovell Sheehan, MD      . fluticasone [facility]) 50 MCG/ACT nasal spray 2 spray  2 spray Each Nare Daily Lovell Sheehan, MD      . gabapentin (NEURONTIN) capsule 300 mg  300 mg Oral TID Lovell Sheehan, MD      . hydrochlorothiazide (HYDRODIURIL) tablet 25 mg  25 mg Oral Daily Lovell Sheehan, MD      . HYDROcodone-acetaminophen (NORCO) 7.5-325 MG per tablet 1-2 tablet  1-2 tablet Oral Q4H PRN Lovell Sheehan, MD      . HYDROcodone-acetaminophen (NORCO/VICODIN) 5-325 MG per tablet 1-2 tablet  1-2 tablet Oral Q4H PRN Lovell Sheehan, MD   1 tablet at 10/03/17 1519  . ketorolac  (TORADOL) 15 MG/ML injection 7.5 mg  7.5 mg Intravenous Q6H Lovell Sheehan, MD   7.5 mg at 10/03/17 1158  . lactated ringers infusion   Intravenous Continuous Lovell Sheehan, MD 75 mL/hr at 10/03/17 1254    . losartan (COZAAR) tablet 100 mg  100 mg Oral Daily Lovell Sheehan, MD      . magnesium citrate solution 1 Bottle  1 Bottle Oral Once PRN Lovell Sheehan, MD      . menthol-cetylpyridinium (CEPACOL) lozenge 3 mg  1 lozenge Oral PRN Lovell Sheehan, MD       Or  . phenol (CHLORASEPTIC) mouth spray 1 spray  1 spray Mouth/Throat PRN Lovell Sheehan, MD      . metoCLOPramide (REGLAN) tablet 5-10 mg  5-10 mg Oral Q8H PRN Lovell Sheehan, MD       Or  . metoCLOPramide (REGLAN) injection 5-10 mg  5-10 mg Intravenous Q8H PRN Lovell Sheehan, MD      . mirabegron ER [facility]) tablet 50 mg  50 mg Oral Daily Lovell Sheehan, MD      . morphine 2 MG/ML injection 0.5-1 mg  0.5-1 mg Intravenous Q2H PRN Lovell Sheehan, MD      . ondansetron [facility]) tablet 4 mg  4 mg Oral Q6H PRN Lovell Sheehan, MD       Or  . ondansetron [facility]) injection 4 mg  4 mg Intravenous Q6H PRN Lovell Sheehan, MD      . polyethylene glycol (MIRALAX / GLYCOLAX) packet 17 g  17 g Oral Daily PRN Lovell Sheehan, MD      . polyvinyl alcohol (LIQUIFILM TEARS) 1.4 % ophthalmic solution 1 drop  1 drop Both Eyes BID PRN Lovell Sheehan, MD         Discharge Medications: Please see discharge summary for a list of discharge medications.  Relevant Imaging Results:  Relevant Lab Results:   Additional Information (SSN: [id number])  Jireh Elmore, Veronia Beets, LCSW

## 2017-10-03 NOTE — Transfer of Care (Signed)
Immediate Anesthesia Transfer of Care Note  Patient: Lisa Paul  Procedure(s) Performed: TOTAL HIP ARTHROPLASTY ANTERIOR APPROACH (Right Hip)  Patient Location: PACU  Anesthesia Type:Spinal  Level of Consciousness: sedated  Airway & Oxygen Therapy: Patient Spontanous Breathing and Patient connected to face mask oxygen  Post-op Assessment: Report given to RN and Post -op Vital signs reviewed and stable  Post vital signs: Reviewed and stable  Last Vitals:  Vitals Value Taken Time  BP    Temp    Pulse 66 10/03/2017 10:01 AM  Resp 16 10/03/2017 10:01 AM  SpO2 100 % 10/03/2017 10:01 AM  Vitals shown include unvalidated device data.  Last Pain:  Vitals:   10/03/17 0611  TempSrc: Oral  PainSc: 6          Complications: No apparent anesthesia complications

## 2017-10-03 NOTE — Op Note (Signed)
10/03/2017  9:55 AM  PATIENT:  MARIGENE ERLER   MRN: [id number]  PRE-OPERATIVE DIAGNOSIS:  Osteoarthritis right hip   POST-OPERATIVE DIAGNOSIS: Same  Procedure: Right Total Hip Replacement  Surgeon: Elyn Aquas. Harlow Mares, MD   Assist: Carlynn Spry, PA-C  Anesthesia: Spinal   EBL: 100 mL   Specimens: None   Drains: None   Components used: A size 3 Polarstem [employer]hew, R3 size 52 mm shell, and a 36 mm head    Description of the procedure in detail: After informed consent was obtained and the appropriate extremity marked in the pre-operative holding area, the patient was taken to the operating room and placed in the supine position on the fracture table. All pressure points were well padded and bilateral lower extremities were place in traction spars. The hip was prepped and draped in standard sterile fashion. A spinal anesthetic had been delivered by the anesthesia team. The skin and subcutaneous tissues were injected with a mixture of Marcaine with epinephrine for post-operative pain. A longitudinal incision approximately 10 cm in length was carried out from the anterior superior iliac spine to the greater trochanter. The tensor fascia was divided and blunt dissection was taken down to the level of the joint capsule. The lateral circumflex vessels were cauterized. Deep retractors were placed and a portion of the anterior capsule was excised. Using fluoroscopy the neck cut was planned and carried out with a sagittal saw. The head was passed from the field with use of a corkscrew and hip skid. Deep retractors were placed along the acetabulum and the degenerative labrum and large osteophytes were removed with a Rongeur. The cup was sequentially reamed to a size 52. The wound was irrigated and using fluoroscopy the size 52 mm cup was impacted in to anatomic position. A single screw was placed followed by a threaded hole cover. The final liner was impacted in to position. Attention was then  turned to the proximal femur. The leg was placed in extension and external rotation. The canal was opened and sequentially broached to a size 3. The trial components were placed and the hip relocated. The components were found to be in good position using fluoroscopy. The hip was dislocated and the trial components removed. The final components were impacted in to position and the hip relocated. The final components were again check with fluoroscopy and found to be in good position. Hemostasis was achieved with electrocautery. The deep capsule was injected with Marcaine and epinephrine. The wound was irrigated with bacitracin laced normal saline and the tensor fascia closed with #2 Quill suture. The subcutaneous tissues were closed with 2-0 vicryl and staples for the skin. A sterile dressing was applied and an abduction pillow. Patient tolerated the procedure well and there were no apparent complication. Patient was taken to the recovery room in good condition.   Kurtis Bushman, MD

## 2017-10-03 NOTE — Evaluation (Signed)
Physical Therapy Evaluation Patient Details Name: Lisa Paul MRN: [id number] DOB: 16-Mar-1935 Today's Date: 10/03/2017   History of Present Illness  admitted for acute hospitalization status post R THR, WBAT (10/03/17).  Clinical Impression  Upon evaluation, patient alert and oriented; follows all commands and demonstrates good effort with all mobility tasks.  Does demonstrate mild/mod memory deficits throughout session.  R LE strength grossly at least 3-/5, ROM functional for basic transfers and mobility.  Able to complete bed mobility with cga/min assist; sit/stand, basic transfers and short-distance gait (25') with Rw, cga/min assist.  3-point, step to gait pattern with limited active use/WBing R LE noted; anticipate improvement in mechanics and gait quality as pain controlled. Would benefit from skilled PT to address above deficits and promote optimal return to PLOF; recommend transition to home with son and outpatient PT follow up as appropriate.    Follow Up Recommendations      Equipment Recommendations  Rolling walker with 5" wheels;3in1 (PT)    Recommendations for Other Services       Precautions / Restrictions Precautions Precautions: Fall;Anterior Hip Restrictions Weight Bearing Restrictions: Yes RLE Weight Bearing: Weight bearing as tolerated      Mobility  Bed Mobility Overal bed mobility: Needs Assistance Bed Mobility: Supine to Sit     Supine to sit: Min guard;Min assist     General bed mobility comments: assist for LE position/management  Transfers Overall transfer level: Needs assistance Equipment used: Rolling walker (2 wheeled) Transfers: Sit to/from Stand Sit to Stand: Min assist         General transfer comment: cuing for hand/foot placement  Ambulation/Gait Ambulation/Gait assistance: Min assist Gait Distance (Feet): 25 Feet Assistive device: Rolling walker (2 wheeled)       General Gait Details: step to gait pattern; decreased stance  time/weight acceptance R LE; min cuing for walker position, overall safety.  forward flexed posture, mod WBing bilat UEs  Stairs            Wheelchair Mobility    Modified Rankin (Stroke Patients Only)       Balance Overall balance assessment: Needs assistance Sitting-balance support: No upper extremity supported;Feet supported Sitting balance-Leahy Scale: Good     Standing balance support: Bilateral upper extremity supported Standing balance-Leahy Scale: Fair                               Pertinent Vitals/Pain Pain Assessment: Faces Faces Pain Scale: Hurts even more Pain Location: R LE Pain Descriptors / Indicators: Aching;Grimacing;Guarding Pain Intervention(s): Limited activity within patient's tolerance;Monitored during session;Repositioned    Home Living Family/patient expects to be discharged to:: Private residence Living Arrangements: Children Available Help at Discharge: Family Type of Home: House       Home Layout: Two level;Able to live on main level with bedroom/bathroom Home Equipment: Kasandra Knudsen - single point      Prior Function Level of Independence: Independent with assistive device(s)         Comments: Mod indep with SPC for ADLs, household and limited community mobilization; endorses single fall within previous year.     Hand Dominance        Extremity/Trunk Assessment   Upper Extremity Assessment Upper Extremity Assessment: Overall WFL for tasks assessed    Lower Extremity Assessment Lower Extremity Assessment: Generalized weakness(R LE grossly at least 3-/5, knee and ankle at least 4+/5; mild baseline paresthesia distal toes and plantar surface of R foot)  Communication   Communication: No difficulties  Cognition Arousal/Alertness: Awake/alert Behavior During Therapy: WFL for tasks assessed/performed Overall Cognitive Status: Within Functional Limits for tasks assessed                                         General Comments      Exercises Other Exercises Other Exercises: Supine R LE therex, 1x10, act assist ROM: ankle pumps, quad sets, SAQs, heel slides, hip abduct/adduct.  Good isolated activation and control to R LE.   Assessment/Plan    PT Assessment Patient needs continued PT services  PT Problem List Decreased strength;Decreased range of motion;Decreased activity tolerance;Decreased balance;Decreased mobility;Decreased coordination;Decreased knowledge of use of DME;Decreased safety awareness;Decreased knowledge of precautions;Decreased cognition       PT Treatment Interventions DME instruction;Gait training;Stair training;Functional mobility training;Therapeutic activities;Therapeutic exercise;Balance training;Patient/family education    PT Goals (Current goals can be found in the Care Plan section)  Acute Rehab PT Goals Patient Stated Goal: to get out of the bed and get off this pressure point PT Goal Formulation: With patient/family Time For Goal Achievement: 10/17/17 Potential to Achieve Goals: Good    Frequency BID   Barriers to discharge        Co-evaluation               AM-PAC PT "6 Clicks" Daily Activity  Outcome Measure Difficulty turning over in bed (including adjusting bedclothes, sheets and blankets)?: Unable Difficulty moving from lying on back to sitting on the side of the bed? : Unable Difficulty sitting down on and standing up from a chair with arms (e.g., wheelchair, bedside commode, etc,.)?: Unable Help needed moving to and from a bed to chair (including a wheelchair)?: A Little Help needed walking in hospital room?: A Little Help needed climbing 3-5 steps with a railing? : A Lot 6 Click Score: 11    End of Session Equipment Utilized During Treatment: Gait belt Activity Tolerance: Patient tolerated treatment well Patient left: in chair;with chair alarm set;with call bell/phone within reach;with family/visitor present Nurse  Communication: Mobility status PT Visit Diagnosis: Difficulty in walking, not elsewhere classified (R26.2);Pain Pain - Right/Left: Right Pain - part of body: Hip    Time: 5364-6803 PT Time Calculation (min) (ACUTE ONLY): 32 min   Charges:   PT Evaluation $PT Eval Moderate Complexity: 1 Mod PT Treatments $Therapeutic Exercise: 8-22 mins   PT G Codes:        Micheale Schlack H. Owens Shark, PT, DPT, NCS 10/03/17, 3:47 PM [phone]

## 2017-10-03 NOTE — Anesthesia Procedure Notes (Addendum)
Spinal  Patient location during procedure: OR Start time: 10/03/2017 7:28 AM End time: 10/03/2017 7:45 AM Staffing Anesthesiologist: Piscitello, Precious Haws, MD Performed: anesthesiologist  Preanesthetic Checklist Completed: patient identified, site marked, surgical consent, pre-op evaluation, timeout performed, IV checked, risks and benefits discussed and monitors and equipment checked Spinal Block Patient position: sitting Prep: Betadine Patient monitoring: heart rate, continuous pulse ox, blood pressure and cardiac monitor Approach: midline Location: L4-5 Injection technique: single-shot Needle Needle type: Whitacre and Introducer  Needle gauge: 25 G Needle length: 9 cm Assessment Sensory level: T10 Additional Notes Negative paresthesia. Negative blood return. Positive free-flowing CSF. Expiration date of kit checked and confirmed. Patient tolerated procedure well, without complications. [place] Samford [employer] CRNA X 2 attempts ----> MDA Piscitello asked pt if she would like [place] to continue spinal attempts and pt indicated that she would like the spinal anesthetic.   Spinal successful.  No problems noted

## 2017-10-03 NOTE — OR Nursing (Signed)
Skin irritation noted on surgical side under the panis, skin intact.  Dr Harlow Mares made aware and assessed.  Patient unsure if lymph nodes removed from left side with breast surgery in 2012.  Patient states she always has them avoid using left arm for BP and blood draws.  Pink sleeve in place.

## 2017-10-03 NOTE — Progress Notes (Signed)
Pt admitted to room 150 from PACU. Pt is A&Ox4, but is very anxious about pain in neck and right hip. Aquacel dressing CDI, foley and abduction pillow in place. Pt and her son educated on pain medication regimen and plan of care.   [place], Jerry Caras

## 2017-10-03 NOTE — Anesthesia Preprocedure Evaluation (Signed)
Anesthesia Evaluation  Patient identified by MRN, date of birth, ID band Patient awake    Reviewed: Allergy & Precautions, H&P , NPO status , Patient's Chart, lab work & pertinent test results  History of Anesthesia Complications Negative for: history of anesthetic complications  Airway Mallampati: III  TM Distance: <3 FB Neck ROM: full    Dental  (+) Chipped   Pulmonary neg shortness of breath, sleep apnea ,           Cardiovascular Exercise Tolerance: Good hypertension, (-) angina(-) Past MI and (-) DOE      Neuro/Psych PSYCHIATRIC DISORDERS Anxiety Depression negative neurological ROS     GI/Hepatic Neg liver ROS, GERD  Medicated and Controlled,  Endo/Other  negative endocrine ROS  Renal/GU      Musculoskeletal   Abdominal   Peds  Hematology negative hematology ROS (+)   Anesthesia Other Findings Past Medical History: No date: Allergy No date: Anxiety 1993: Arthritis 2012: Cancer ([place])     Comment:  Left Breast with lumpectomy and mammosite No date: Cataract 2005: Colon polyp No date: Dyspnea No date: GERD (gastroesophageal reflux disease) 15 years: Hypertension No date: Incontinence of urine No date: Lower extremity edema No date: Malignant neoplasm of breast (female), unspecified site     Comment:  left breast No date: Neuropathy No date: Sleep apnea  Past Surgical History: 30 years ago: ABDOMINAL HYSTERECTOMY 2012: BREAST BIOPSY; Left     Comment:  invasive mammary carcinoma 2012: BREAST LUMPECTOMY; Left No date: CARPAL TUNNEL RELEASE; Bilateral 05/2003: COLONOSCOPY     Comment:  Dr Nicolasa Ducking [facility] 07/24/12: COLONOSCOPY 2005: EYE SURGERY; Bilateral     Comment:  cataract 2007: JOINT REPLACEMENT; Bilateral     Comment:  knees 08/2010: mammosite balloon placement; Left     Comment:  Removal 09/2010 age 83: TONSILLECTOMY  BMI    Body Mass Index:  40.51 kg/m       Reproductive/Obstetrics negative OB ROS                             Anesthesia Physical Anesthesia Plan  ASA: III  Anesthesia Plan: Spinal   Post-op Pain Management:    Induction:   PONV Risk Score and Plan:   Airway Management Planned: Natural Airway and Nasal Cannula  Additional Equipment:   Intra-op Plan:   Post-operative Plan:   Informed Consent: I have reviewed the patients History and Physical, chart, labs and discussed the procedure including the risks, benefits and alternatives for the proposed anesthesia with the patient or authorized representative who has indicated his/her understanding and acceptance.   Dental Advisory Given  Plan Discussed with: Anesthesiologist, CRNA and Surgeon  Anesthesia Plan Comments: (Patient reports no bleeding problems and no anticoagulant use.  Plan for spinal with backup GA  Patient consented for risks of anesthesia including but not limited to:  - adverse reactions to medications - risk of bleeding, infection, nerve damage and headache - risk of failed spinal - damage to teeth, lips or other oral mucosa - sore throat or hoarseness - Damage to heart, brain, lungs or loss of life  Patient voiced understanding.)        Anesthesia Quick Evaluation

## 2017-10-04 LAB — CBC
HEMATOCRIT: 34.9 % — AB (ref 35.0–47.0)
Hemoglobin: 11.9 g/dL — ABNORMAL LOW (ref 12.0–16.0)
MCH: 32.2 pg (ref 26.0–34.0)
MCHC: 34.1 g/dL (ref 32.0–36.0)
MCV: 94.5 fL (ref 80.0–100.0)
PLATELETS: 256 10*3/uL (ref 150–440)
RBC: 3.69 MIL/uL — ABNORMAL LOW (ref 3.80–5.20)
RDW: 14.1 % (ref 11.5–14.5)
WBC: 8.2 10*3/uL (ref 3.6–11.0)

## 2017-10-04 LAB — BASIC METABOLIC PANEL
ANION GAP: 8 (ref 5–15)
BUN: 17 mg/dL (ref 8–23)
CALCIUM: 8.4 mg/dL — AB (ref 8.9–10.3)
CO2: 23 mmol/L (ref 22–32)
CREATININE: 0.82 mg/dL (ref 0.44–1.00)
Chloride: 108 mmol/L (ref 98–111)
GFR calc Af Amer: >60 mL/min (ref >60)
Glucose, Bld: 97 mg/dL (ref 70–99)
Potassium: 4.3 mmol/L (ref 3.5–5.1)
Sodium: 139 mmol/L (ref 135–145)

## 2017-10-04 NOTE — Progress Notes (Signed)
Physical Therapy Treatment Patient Details Name: Lisa Paul MRN: [id number] DOB: [birth year] Today's Date: 10/04/2017    History of Present Illness admitted for acute hospitalization status post R THR, WBAT (10/03/17).    PT Comments    Pt bed.  Pt ready for therapy session.  Pt trying to get to side of bed despite leg rail being up and SCD's on LE's.  Pt asked several times to slow down and relax while I readied room for session and needed multiple verbal and tactile cues not to rush to the side of the bed.  Pt commented several times regarding her anxiety and was difficulty at times to keep focused on tasks at hand.  Stood with min assist.  She was able to walk 48' then and additional 120' with walker and min guard.  Verbal cues for walker position and step pattern.  Encouragement given.  Sons in attendance and observed session.  Aware of anxiety/impulsiveness issues and they made me believe this was her baseline.  Overall pt did well with gait.  They plan to drive her to [facility] when discharged.  Encouraged them to bring a wheelchair, which they have at home, in case they need to stop for bathroom along the way.   Follow Up Recommendations  Outpatient PT     Equipment Recommendations  Rolling walker with 5" wheels    Recommendations for Other Services       Precautions / Restrictions Precautions Precautions: Fall;Anterior Hip Restrictions Weight Bearing Restrictions: Yes RLE Weight Bearing: Weight bearing as tolerated    Mobility  Bed Mobility Overal bed mobility: Needs Assistance Bed Mobility: Supine to Sit     Supine to sit: Min guard;Min assist     General bed mobility comments: assist for LE position/management  Transfers Overall transfer level: Needs assistance Equipment used: Rolling walker (2 wheeled) Transfers: Sit to/from Stand Sit to Stand: Min assist         General transfer comment: cuing for hand/foot placement  Ambulation/Gait Ambulation/Gait  assistance: Min assist Gait Distance (Feet): 190 Feet Assistive device: Rolling walker (2 wheeled) Gait Pattern/deviations: Step-through pattern   Gait velocity interpretation: <1.8 ft/sec, indicate of risk for recurrent falls General Gait Details: 60 then an additional 120' after seated rest   Stairs             Wheelchair Mobility    Modified Rankin (Stroke Patients Only)       Balance Overall balance assessment: Needs assistance Sitting-balance support: No upper extremity supported;Feet supported Sitting balance-Leahy Scale: Good     Standing balance support: Bilateral upper extremity supported Standing balance-Leahy Scale: Fair                              Cognition Arousal/Alertness: Awake/alert Behavior During Therapy: WFL for tasks assessed/performed Overall Cognitive Status: Within Functional Limits for tasks assessed                                        Exercises      General Comments        Pertinent Vitals/Pain Pain Assessment: Faces Faces Pain Scale: Hurts even more Pain Location: R LE Pain Descriptors / Indicators: Aching;Grimacing;Guarding Pain Intervention(s): Limited activity within patient's tolerance;Monitored during session    Home Living  Prior Function            PT Goals (current goals can now be found in the care plan section) Progress towards PT goals: Progressing toward goals    Frequency    BID      PT Plan Current plan remains appropriate    Co-evaluation              AM-PAC PT "6 Clicks" Daily Activity  Outcome Measure  Difficulty turning over in bed (including adjusting bedclothes, sheets and blankets)?: A Little Difficulty moving from lying on back to sitting on the side of the bed? : A Little Difficulty sitting down on and standing up from a chair with arms (e.g., wheelchair, bedside commode, etc,.)?: A Little Help needed moving to and from  a bed to chair (including a wheelchair)?: A Little Help needed walking in hospital room?: A Little Help needed climbing 3-5 steps with a railing? : A Lot 6 Click Score: 17    End of Session Equipment Utilized During Treatment: Gait belt Activity Tolerance: Patient tolerated treatment well Patient left: in chair;with chair alarm set;with call bell/phone within reach Nurse Communication: Mobility status Pain - Right/Left: Right Pain - part of body: Hip     Time: 1140-1205 PT Time Calculation (min) (ACUTE ONLY): 25 min  Charges:  $Gait Training: 23-37 mins                    G Codes:       Chesley Noon, PTA 10/04/17, 1:26 PM

## 2017-10-04 NOTE — Progress Notes (Signed)
Clinical Social Worker (CSW) received SNF consult. PT is recommending outpatient PT. RN case manager aware of above. Please reconsult if future social work needs arise. CSW signing off.   Mylena Sedberry, LCSW [phone]  

## 2017-10-04 NOTE — Progress Notes (Signed)
  Subjective:  Patient reports pain as mild.    Objective:   VITALS:   Vitals:   10/03/17 2011 10/03/17 2307 10/04/17 0450 10/04/17 0837  BP: (!) 113/58 (!) 100/46 (!) 143/64 (!) 120/97  Pulse: 95 96 90 (!) 110  Resp: 18 16 18 18   Temp: 98.5 F (36.9 C) 98.7 F (37.1 C) 98 F (36.7 C) 98.4 F (36.9 C)  TempSrc: Oral Oral Oral Oral  SpO2: 100% 100% 99% 100%  Weight:      Height:        PHYSICAL EXAM:  Neurologically intact ABD soft Sensation intact distally Dorsiflexion/Plantar flexion intact Incision: moderate drainage No cellulitis present Compartment soft  LABS  Results for orders placed or performed during the hospital encounter of 10/03/17 (from the past 24 hour(s))  CBC     Status: Abnormal   Collection Time: 10/04/17  4:26 AM  Result Value Ref Range   WBC 8.2 3.6 - 11.0 K/uL   RBC 3.69 (L) 3.80 - 5.20 MIL/uL   Hemoglobin 11.9 (L) 12.0 - 16.0 g/dL   HCT 34.9 (L) 35.0 - 47.0 %   MCV 94.5 80.0 - 100.0 fL   MCH 32.2 26.0 - 34.0 pg   MCHC 34.1 32.0 - 36.0 g/dL   RDW 14.1 11.5 - 14.5 %   Platelets 256 150 - 440 K/uL  Basic metabolic panel     Status: Abnormal   Collection Time: 10/04/17  4:26 AM  Result Value Ref Range   Sodium 139 135 - 145 mmol/L   Potassium 4.3 3.5 - 5.1 mmol/L   Chloride 108 98 - 111 mmol/L   CO2 23 22 - 32 mmol/L   Glucose, Bld 97 70 - 99 mg/dL   BUN 17 8 - 23 mg/dL   Creatinine, Ser 0.82 0.44 - 1.00 mg/dL   Calcium 8.4 (L) 8.9 - 10.3 mg/dL   GFR calc non Af Amer >60 >60 mL/min   GFR calc Af Amer >60 >60 mL/min   Anion gap 8 5 - 15    Dg Pelvis Portable  Result Date: 10/03/2017 CLINICAL DATA:  Postop EXAM: PORTABLE PELVIS 1-2 VIEWS COMPARISON:  Portable exam 0957 hours compared to 11/09/2016 FINDINGS: Interval placement of RIGHT hip prosthesis. No fracture, dislocation or bone destruction identified on single AP view. Bones appear demineralized. Mild degenerative changes of the LEFT hip joint noted. IMPRESSION: RIGHT hip  prosthesis without acute abnormalities. Electronically Signed   By: Lavonia Dana M.D.   On: 10/03/2017 10:17   Dg Hip Operative Unilat W Or W/o Pelvis Right  Result Date: 10/03/2017 CLINICAL DATA:  Hip replacement surgery EXAM: OPERATIVE RIGHT HIP (WITH PELVIS IF PERFORMED) 3 VIEWS TECHNIQUE: Fluoroscopic spot image(s) were submitted for interpretation post-operatively. COMPARISON:  11/09/2016 FINDINGS: A series of 3 fluoroscopic intraoperative images document placement of femoral and acetabular components of right hip arthroplasty, projecting in expected location. No fracture or other apparent complication IMPRESSION: Interval right hip arthroplasty. Electronically Signed   By: Lucrezia Europe M.D.   On: 10/03/2017 11:21    Assessment/Plan: 1 Day Post-Op   Active Problems:   Osteoarthritis of right hip   Advance diet Up with therapy D/C IV fluids Plan for discharge tomorrow or Saturday pending progress with physical therapy.   Lovell Sheehan , MD 10/04/2017, 9:45 AM

## 2017-10-04 NOTE — Anesthesia Postprocedure Evaluation (Signed)
Anesthesia Post Note  Patient: AURIELLE SLINGERLAND  Procedure(s) Performed: TOTAL HIP ARTHROPLASTY ANTERIOR APPROACH (Right Hip)  Patient location during evaluation: Nursing Unit Anesthesia Type: Spinal Level of consciousness: oriented and awake and alert Pain management: pain level controlled Vital Signs Assessment: post-procedure vital signs reviewed and stable Respiratory status: spontaneous breathing, respiratory function stable and patient connected to nasal cannula oxygen Cardiovascular status: blood pressure returned to baseline and stable Postop Assessment: no headache, no backache and no apparent nausea or vomiting Anesthetic complications: no     Last Vitals:  Vitals:   10/03/17 2307 10/04/17 0450  BP: (!) 100/46 (!) 143/64  Pulse: 96 90  Resp: 16 18  Temp: 37.1 C 36.7 C  SpO2: 100% 99%    Last Pain:  Vitals:   10/04/17 0746  TempSrc:   PainSc: Asleep                 Lynett Brasil,  Clearnce Sorrel

## 2017-10-05 ENCOUNTER — Encounter: Payer: Self-pay | Admitting: Orthopedic Surgery

## 2017-10-05 LAB — CBC
HEMATOCRIT: 33.8 % — AB (ref 35.0–47.0)
HEMOGLOBIN: 11.3 g/dL — AB (ref 12.0–16.0)
MCH: 31.2 pg (ref 26.0–34.0)
MCHC: 33.5 g/dL (ref 32.0–36.0)
MCV: 93.1 fL (ref 80.0–100.0)
Platelets: 251 10*3/uL (ref 150–440)
RBC: 3.63 MIL/uL — ABNORMAL LOW (ref 3.80–5.20)
RDW: 14 % (ref 11.5–14.5)
WBC: 9.9 10*3/uL (ref 3.6–11.0)

## 2017-10-05 NOTE — Care Management (Signed)
Patient to be discharged today. Outpatient previously set up with a facility in [place] via the [place]. Orders in place from PT for [facility] and rolling walker. Since Bundle Case Manager is not available today will set up DME through [facility]. No other needs. RNCM to sign off. Ines Bloomer RN BSN RNCM [phone]

## 2017-10-05 NOTE — Plan of Care (Signed)
  Problem: Education: Goal: Knowledge of General Education information will improve Outcome: Progressing   Problem: Health Behavior/Discharge Planning: Goal: Ability to manage health-related needs will improve Outcome: Progressing   Problem: Clinical Measurements: Goal: Ability to maintain clinical measurements within normal limits will improve Outcome: Progressing Goal: Will remain free from infection Outcome: Progressing Goal: Diagnostic test results will improve Outcome: Progressing Goal: Respiratory complications will improve Outcome: Progressing Goal: Cardiovascular complication will be avoided Outcome: Progressing   Problem: Activity: Goal: Risk for activity intolerance will decrease Outcome: Progressing   Problem: Nutrition: Goal: Adequate nutrition will be maintained Outcome: Progressing   Problem: Coping: Goal: Level of anxiety will decrease Outcome: Progressing   Problem: Elimination: Goal: Will not experience complications related to bowel motility Outcome: Progressing Goal: Will not experience complications related to urinary retention Outcome: Progressing   Problem: Pain Managment: Goal: General experience of comfort will improve Outcome: Progressing   Problem: Safety: Goal: Ability to remain free from injury will improve Outcome: Progressing   Problem: Skin Integrity: Goal: Risk for impaired skin integrity will decrease Outcome: Progressing   Problem: Education: Goal: Knowledge of the prescribed therapeutic regimen will improve Outcome: Progressing Goal: Understanding of discharge needs will improve Outcome: Progressing   Problem: Activity: Goal: Ability to avoid complications of mobility impairment will improve Outcome: Progressing Goal: Ability to tolerate increased activity will improve Outcome: Progressing   Problem: Clinical Measurements: Goal: Postoperative complications will be avoided or minimized Outcome: Progressing   Problem:  Pain Management: Goal: Pain level will decrease with appropriate interventions Outcome: Progressing   Problem: Skin Integrity: Goal: Signs of wound healing will improve Outcome: Progressing   

## 2017-10-05 NOTE — Progress Notes (Signed)
Physical Therapy Treatment Patient Details Name: Lisa Paul MRN: [id number] DOB: [birth year] Today's Date: 10/05/2017    History of Present Illness admitted for acute hospitalization status post R THR, WBAT (10/03/17).    PT Comments    Patient making progress with all mobility-decreased level of assist, increased gait distance-but requires consistent encouragement to do so.  Quick to offer 'I don't know if I can do that' prior to making attempt at requested movement, but surprises herself when task attempted and completed. Very perseverative on R hip/groin pain and edema to area; educated on typical progression/recovery after THR, and ensured that this is normal presentation. Will plan to attempt gait around nursing station next session and reinforce stairs with sons (if present in session).  Recommend using entrance with bilat rails (5 steps) for optimal safety/security.  Patient voices understanding/agreement.   Follow Up Recommendations  Outpatient PT     Equipment Recommendations  Rolling walker with 5" wheels    Recommendations for Other Services       Precautions / Restrictions Precautions Precautions: Fall;Anterior Hip Restrictions Weight Bearing Restrictions: Yes RLE Weight Bearing: Weight bearing as tolerated    Mobility  Bed Mobility Overal bed mobility: Needs Assistance Bed Mobility: Supine to Sit;Sit to Supine     Supine to sit: Min assist Sit to supine: Min assist   General bed mobility comments: assist for R LE; encouraged to make indep attempt, as patient often asks for assist prior to attempting  Transfers Overall transfer level: Needs assistance Equipment used: Rolling walker (2 wheeled) Transfers: Sit to/from Stand Sit to Stand: Supervision;Min guard            Ambulation/Gait Ambulation/Gait assistance: Supervision;Min guard Gait Distance (Feet): 100 Feet Assistive device: Rolling walker (2 wheeled)       General Gait Details: good  progression towards reciprocal stepping pattern, though bilat step height/length remains abbreviated.  Continued cuing for postural extension and sustained cadence/gait speed.   Stairs Stairs: Yes Stairs assistance: Min guard;+2 safety/equipment Stair Management: Two rails Number of Stairs: 4 General stair comments: step to gait pattern, min/mod cuing for technique (decreased recall of new information).  Good R LE weight acceptance and control in modified SLS (bilat UE support on rails).  Generally fearful/anxious of task.   Wheelchair Mobility    Modified Rankin (Stroke Patients Only)       Balance Overall balance assessment: Needs assistance Sitting-balance support: No upper extremity supported;Feet supported Sitting balance-Leahy Scale: Good     Standing balance support: Bilateral upper extremity supported Standing balance-Leahy Scale: Fair                              Cognition Arousal/Alertness: Awake/alert Behavior During Therapy: WFL for tasks assessed/performed Overall Cognitive Status: Within Functional Limits for tasks assessed                                        Exercises Other Exercises Other Exercises: Toilet transfer, SPT with RW, cga/close sup; sit/stand with RW for hygiene, clothing management, cga/close sup.  Min verbal cuing to maintain UE support on RW and for overall safety with movement transition.    General Comments        Pertinent Vitals/Pain Pain Assessment: Faces Faces Pain Scale: Hurts little more Pain Location: R LE Pain Descriptors / Indicators: Aching;Grimacing;Guarding Pain Intervention(s): Limited activity  within patient's tolerance;Monitored during session;Repositioned    Home Living                      Prior Function            PT Goals (current goals can now be found in the care plan section) Acute Rehab PT Goals Patient Stated Goal: to get out of the bed and get off this pressure  point PT Goal Formulation: With patient/family Time For Goal Achievement: 10/17/17 Potential to Achieve Goals: Good Progress towards PT goals: Progressing toward goals    Frequency    BID      PT Plan Current plan remains appropriate    Co-evaluation              AM-PAC PT "6 Clicks" Daily Activity  Outcome Measure  Difficulty turning over in bed (including adjusting bedclothes, sheets and blankets)?: A Little Difficulty moving from lying on back to sitting on the side of the bed? : A Little Difficulty sitting down on and standing up from a chair with arms (e.g., wheelchair, bedside commode, etc,.)?: A Little Help needed moving to and from a bed to chair (including a wheelchair)?: A Little Help needed walking in hospital room?: A Little Help needed climbing 3-5 steps with a railing? : A Little 6 Click Score: 18    End of Session Equipment Utilized During Treatment: Gait belt Activity Tolerance: Patient tolerated treatment well Patient left: with call bell/phone within reach;in bed;with bed alarm set Nurse Communication: Mobility status PT Visit Diagnosis: Difficulty in walking, not elsewhere classified (R26.2);Pain;Muscle weakness (generalized) (M62.81) Pain - Right/Left: Right Pain - part of body: Hip     Time: 2060-1561 PT Time Calculation (min) (ACUTE ONLY): 28 min  Charges:  $Gait Training: 8-22 mins $Therapeutic Activity: 8-22 mins                    G Codes:       Sederick Jacobsen H. Owens Shark, PT, DPT, NCS 10/05/17, 4:14 PM [phone]

## 2017-10-05 NOTE — Progress Notes (Signed)
Physical Therapy Treatment Patient Details Name: Lisa Paul MRN: [id number] DOB: 14-Nov-1934 Today's Date: 10/05/2017    History of Present Illness admitted for acute hospitalization status post R THR, WBAT (10/03/17).    PT Comments    Limited activity tolerance this AM due to increased pain/soreness to R LE, but agreeable to activity as tolerated with encouragement.  Decreased anxiety/impulsiveness noted this session (of note, sons not present in room). Will plan to attempts stairs in PM session-layout of son's home clarified (2 steps at side entrance, bilat rails, too far to hold both; 5 steps at garage entrance, bilat rails, close enough to hold both; single step at back entrance, no rails).  Patient does report having access to portable ramps if needed.   Follow Up Recommendations  Outpatient PT     Equipment Recommendations  Rolling walker with 5" wheels    Recommendations for Other Services       Precautions / Restrictions Precautions Precautions: Fall;Anterior Hip Restrictions Weight Bearing Restrictions: Yes RLE Weight Bearing: Weight bearing as tolerated    Mobility  Bed Mobility               General bed mobility comments: seated in recliner beginning/end of treatment session  Transfers Overall transfer level: Needs assistance Equipment used: Rolling walker (2 wheeled) Transfers: Sit to/from Stand Sit to Stand: Min guard         General transfer comment: good hand placement and control with movement transition  Ambulation/Gait Ambulation/Gait assistance: Min guard;Min assist Gait Distance (Feet): 125 Feet Assistive device: Rolling walker (2 wheeled)       General Gait Details: reciprocal stepping pattern; forward flexed posture with heavy WBing bilat UEs.  Cuing for postural extension, cadence/gait speed and overall mechanics.  Distance limited by pain.   Stairs             Wheelchair Mobility    Modified Rankin (Stroke Patients  Only)       Balance Overall balance assessment: Needs assistance Sitting-balance support: No upper extremity supported;Feet supported Sitting balance-Leahy Scale: Good     Standing balance support: Bilateral upper extremity supported Standing balance-Leahy Scale: Fair                              Cognition Arousal/Alertness: Awake/alert Behavior During Therapy: WFL for tasks assessed/performed Overall Cognitive Status: Within Functional Limits for tasks assessed                                 General Comments: less impulsivity, anxiety noted this session      Exercises Other Exercises Other Exercises: Seated LE therex, 1x12, AROM: ankle pumps, LAQs, hip adduct, marching. Good strength and ROM with targeted therex    General Comments        Pertinent Vitals/Pain Pain Assessment: Faces Faces Pain Scale: Hurts even more Pain Location: R LE Pain Descriptors / Indicators: Aching;Grimacing;Guarding Pain Intervention(s): Limited activity within patient's tolerance;Monitored during session;Repositioned    Home Living                      Prior Function            PT Goals (current goals can now be found in the care plan section) Acute Rehab PT Goals Patient Stated Goal: to get out of the bed and get off this pressure point PT Goal  Formulation: With patient/family Time For Goal Achievement: 10/17/17 Potential to Achieve Goals: Good Progress towards PT goals: Progressing toward goals    Frequency    BID      PT Plan Current plan remains appropriate    Co-evaluation              AM-PAC PT "6 Clicks" Daily Activity  Outcome Measure  Difficulty turning over in bed (including adjusting bedclothes, sheets and blankets)?: A Little Difficulty moving from lying on back to sitting on the side of the bed? : A Little Difficulty sitting down on and standing up from a chair with arms (e.g., wheelchair, bedside commode, etc,.)?: A  Little Help needed moving to and from a bed to chair (including a wheelchair)?: A Little Help needed walking in hospital room?: A Little Help needed climbing 3-5 steps with a railing? : A Lot 6 Click Score: 17    End of Session Equipment Utilized During Treatment: Gait belt Activity Tolerance: Patient tolerated treatment well Patient left: in chair;with chair alarm set;with call bell/phone within reach Nurse Communication: Mobility status PT Visit Diagnosis: Difficulty in walking, not elsewhere classified (R26.2);Pain;Muscle weakness (generalized) (M62.81) Pain - Right/Left: Right Pain - part of body: Hip     Time: 9774-1423 PT Time Calculation (min) (ACUTE ONLY): 21 min  Charges:  $Gait Training: 8-22 mins                    G Codes:       Dhwani Venkatesh H. Owens Shark, PT, DPT, NCS 10/05/17, 10:48 AM [phone]

## 2017-10-06 LAB — CBC
HCT: 34.5 % — ABNORMAL LOW (ref 35.0–47.0)
HEMOGLOBIN: 11.6 g/dL — AB (ref 12.0–16.0)
MCH: 31.7 pg (ref 26.0–34.0)
MCHC: 33.7 g/dL (ref 32.0–36.0)
MCV: 94 fL (ref 80.0–100.0)
Platelets: 258 10*3/uL (ref 150–440)
RBC: 3.67 MIL/uL — AB (ref 3.80–5.20)
RDW: 14 % (ref 11.5–14.5)
WBC: 9.6 10*3/uL (ref 3.6–11.0)

## 2017-10-06 MED ORDER — DOCUSATE SODIUM 100 MG PO CAPS: 100.0000 mg | ORAL_CAPSULE | Freq: Two times a day (BID) | ORAL | 0 refills | Status: DC

## 2017-10-06 MED ORDER — HYDROCODONE-ACETAMINOPHEN 5-325 MG PO TABS: 1.0000 | ORAL_TABLET | Freq: Four times a day (QID) | ORAL | 0 refills | Status: DC | PRN

## 2017-10-06 MED ORDER — ASPIRIN 81 MG PO CHEW: 81.0000 mg | CHEWABLE_TABLET | Freq: Two times a day (BID) | ORAL | 0 refills | Status: DC

## 2017-10-06 NOTE — Plan of Care (Signed)
  Problem: Health Behavior/Discharge Planning: Goal: Ability to manage health-related needs will improve Outcome: Progressing   Problem: Clinical Measurements: Goal: Ability to maintain clinical measurements within normal limits will improve Outcome: Progressing Goal: Will remain free from infection Outcome: Progressing   Problem: Activity: Goal: Risk for activity intolerance will decrease Outcome: Progressing   Problem: Nutrition: Goal: Adequate nutrition will be maintained Outcome: Progressing   Problem: Coping: Goal: Level of anxiety will decrease Outcome: Progressing   Problem: Pain Managment: Goal: General experience of comfort will improve Outcome: Progressing   Problem: Safety: Goal: Ability to remain free from injury will improve Outcome: Progressing   Problem: Education: Goal: Knowledge of the prescribed therapeutic regimen will improve Outcome: Progressing Goal: Understanding of discharge needs will improve Outcome: Progressing   Problem: Activity: Goal: Ability to avoid complications of mobility impairment will improve Outcome: Progressing Goal: Ability to tolerate increased activity will improve Outcome: Progressing   Problem: Pain Management: Goal: Pain level will decrease with appropriate interventions Outcome: Progressing   Problem: Skin Integrity: Goal: Signs of wound healing will improve Outcome: Progressing

## 2017-10-06 NOTE — Progress Notes (Signed)
Physical Therapy Treatment Patient Details Name: Lisa Paul MRN: [id number] DOB: [birth year] Today's Date: 10/06/2017    History of Present Illness admitted for acute hospitalization status post R THR, WBAT (10/03/17).    PT Comments    Pt out of bed with min assist.  Pt was able to complete full lap today without seated rest.  She did need occasional verbal cues to keep walker closer and for proper hand placements but overall does well.  Participated in exercises as described below.   Verbal review of stair training.  Pt stated she felt comfortable with them and declined offer to practice.  Pt less anxious today during session.   Follow Up Recommendations  Outpatient PT     Equipment Recommendations  Rolling walker with 5" wheels    Recommendations for Other Services       Precautions / Restrictions Precautions Precautions: Fall;Anterior Hip Restrictions Weight Bearing Restrictions: Yes RLE Weight Bearing: Weight bearing as tolerated    Mobility  Bed Mobility Overal bed mobility: Needs Assistance Bed Mobility: Supine to Sit     Supine to sit: Min assist        Transfers Overall transfer level: Needs assistance Equipment used: Rolling walker (2 wheeled) Transfers: Sit to/from Stand Sit to Stand: Supervision;Min guard         General transfer comment: good hand placement and control with movement transition  Ambulation/Gait Ambulation/Gait assistance: Supervision;Min guard Gait Distance (Feet): 180 Feet Assistive device: Rolling walker (2 wheeled) Gait Pattern/deviations: Step-through pattern Gait velocity: decreases   General Gait Details: good progression towards reciprocal stepping pattern, though bilat step height/length remains abbreviated.  Continued cuing for postural extension and sustained cadence/gait speed.   Stairs         General stair comments: verbal review of stiar sequencing.  Pt reported feeling comfortable.   Wheelchair  Mobility    Modified Rankin (Stroke Patients Only)       Balance Overall balance assessment: Needs assistance Sitting-balance support: No upper extremity supported;Feet supported Sitting balance-Leahy Scale: Good     Standing balance support: Bilateral upper extremity supported Standing balance-Leahy Scale: Fair                              Cognition Arousal/Alertness: Awake/alert Behavior During Therapy: WFL for tasks assessed/performed Overall Cognitive Status: Within Functional Limits for tasks assessed                                        Exercises Other Exercises Other Exercises: seated arom for LAQ, marches, pillow squeeze and ankle pumps x 10 bialterally    General Comments        Pertinent Vitals/Pain Pain Assessment: 0-10 Faces Pain Scale: Hurts little more Pain Location: RLE, comfortable at rest, increased pain with movement Pain Descriptors / Indicators: Aching;Grimacing;Guarding Pain Intervention(s): Limited activity within patient's tolerance;Premedicated before session;Monitored during session    Home Living                      Prior Function            PT Goals (current goals can now be found in the care plan section) Progress towards PT goals: Progressing toward goals    Frequency    BID      PT Plan Current plan remains appropriate  Co-evaluation              AM-PAC PT "6 Clicks" Daily Activity  Outcome Measure  Difficulty turning over in bed (including adjusting bedclothes, sheets and blankets)?: A Little Difficulty moving from lying on back to sitting on the side of the bed? : A Little Difficulty sitting down on and standing up from a chair with arms (e.g., wheelchair, bedside commode, etc,.)?: A Little Help needed moving to and from a bed to chair (including a wheelchair)?: A Little Help needed walking in hospital room?: A Little   6 Click Score: 15    End of Session Equipment  Utilized During Treatment: Gait belt Activity Tolerance: Patient tolerated treatment well Patient left: with call bell/phone within reach;in chair;with chair alarm set Nurse Communication: Mobility status Pain - Right/Left: Right Pain - part of body: Hip     Time: 1962-2297 PT Time Calculation (min) (ACUTE ONLY): 15 min  Charges:  $Gait Training: 8-22 mins                    G Codes:       Chesley Noon, PTA 10/06/17, 10:14 AM

## 2017-10-06 NOTE — Discharge Instructions (Signed)

## 2017-10-06 NOTE — Discharge Summary (Signed)
Physician Discharge Summary  Patient ID: CHRISTEE MERVINE MRN: [id number] DOB/AGE: 1934-08-23 83 y.o.  Admit date: 10/03/2017 Discharge date: 10/06/2017  Admission Diagnoses:  OSTEOARTHRITIS OF RIGHT HIP <principal problem not specified>  Discharge Diagnoses:  OSTEOARTHRITIS OF RIGHT HIP Active Problems:   Osteoarthritis of right hip   Past Medical History:  Diagnosis Date  . Allergy   . Anxiety   . Arthritis 1993  . Cancer [facility]) 2012   Left Breast with lumpectomy and mammosite  . Cataract   . Colon polyp 2005  . Dyspnea   . GERD (gastroesophageal reflux disease)   . Hypertension 15 years  . Incontinence of urine   . Lower extremity edema   . Malignant neoplasm of breast (female), unspecified site    left breast  . Neuropathy   . Sleep apnea     Surgeries: Procedure(s): TOTAL HIP ARTHROPLASTY ANTERIOR APPROACH on 10/03/2017   Consultants (if any):   Discharged Condition: Improved  Hospital Course: SHARAYA BORUFF is an 83 y.o. female who was admitted 10/03/2017 with a diagnosis of  OSTEOARTHRITIS OF RIGHT HIP <principal problem not specified> and went to the operating room on 10/03/2017 and underwent the above named procedures.    She was given perioperative antibiotics:  Anti-infectives (From admission, onward)   Start     Dose/Rate Route Frequency Ordered Stop   10/03/17 1400  ceFAZolin (ANCEF) IVPB 2g/100 mL premix     2 g 200 mL/hr over 30 Minutes Intravenous Every 6 hours 10/03/17 1122 10/03/17 2337   10/03/17 0834  50,000 units bacitracin in 0.9% normal saline 250 mL irrigation  Status:  Discontinued       As needed 10/03/17 0834 10/03/17 0956   10/03/17 0608  ceFAZolin (ANCEF) 2-4 GM/100ML-% IVPB    Note to Pharmacy:  Dewayne Hatch   : cabinet override      10/03/17 0608 10/03/17 0753   10/03/17 0600  ceFAZolin (ANCEF) IVPB 2g/100 mL premix     2 g 200 mL/hr over 30 Minutes Intravenous On call to O.R. 10/02/17 2205 10/03/17 9798    .  She was given  sequential compression devices, early ambulation, and ECASA for DVT prophylaxis.  She benefited maximally from the hospital stay and there were no complications.    Recent vital signs:  Vitals:   10/06/17 0005 10/06/17 0800  BP: (!) 103/47 130/84  Pulse: 99 94  Resp:    Temp: 98.7 F (37.1 C) 98.7 F (37.1 C)  SpO2: 94% 100%    Recent laboratory studies:  Lab Results  Component Value Date   HGB 11.6 (L) 10/06/2017   HGB 11.3 (L) 10/05/2017   HGB 11.9 (L) 10/04/2017   Lab Results  Component Value Date   WBC 9.6 10/06/2017   PLT 258 10/06/2017   Lab Results  Component Value Date   INR 1.00 09/19/2017   Lab Results  Component Value Date   NA 139 10/04/2017   K 4.3 10/04/2017   CL 108 10/04/2017   CO2 23 10/04/2017   BUN 17 10/04/2017   CREATININE 0.82 10/04/2017   GLUCOSE 97 10/04/2017    Discharge Medications:   Allergies as of 10/06/2017      Reactions   Tape    Paper tape - blisters      Medication List    STOP taking these medications   meloxicam 7.5 MG tablet Commonly known as:  MOBIC     TAKE these medications   acetaminophen 500 MG tablet  Commonly known as:  TYLENOL Take 2 tablets (1,000 mg total) by mouth every 8 (eight) hours as needed. What changed:    how much to take  reasons to take this   aspirin 81 MG chewable tablet Chew 1 tablet (81 mg total) by mouth 2 (two) times daily.   B-12 1000 MCG Tabs Take 1 tablet by mouth daily. What changed:  how much to take   diphenhydrAMINE 25 MG tablet Commonly known as:  BENADRYL Take 25 mg by mouth at bedtime as needed (for sleep & itching.).   docusate sodium 100 MG capsule Commonly known as:  COLACE Take 1 capsule (100 mg total) by mouth 2 (two) times daily.   DULoxetine 30 MG capsule Commonly known as:  CYMBALTA Take 1 capsule (30 mg total) by mouth daily.   fluticasone 50 MCG/ACT nasal spray Commonly known as:  FLONASE PLACE 2 SPRAYS INTO BOTH NOSTRILS DAILY. What changed:     when to take this  additional instructions   HYDROcodone-acetaminophen 5-325 MG tablet Commonly known as:  NORCO/VICODIN Take 1 tablet by mouth every 6 (six) hours as needed for moderate pain (pain score 4-6).   losartan-hydrochlorothiazide 100-25 MG tablet Commonly known as:  HYZAAR TAKE 1 TABLET BY MOUTH EVERY DAY   LUBRICANT EYE DROPS 0.4-0.3 % Soln Generic drug:  Polyethyl Glycol-Propyl Glycol Place 1 drop into both eyes 2 (two) times daily as needed (for dry eyes.).   mirabegron ER 50 MG Tb24 tablet Commonly known as:  MYRBETRIQ Take 1 tablet (50 mg total) by mouth daily.   multivitamin with minerals Tabs tablet Take 1 tablet by mouth daily. Women's Daily Multivitamin   ranitidine 150 MG tablet Commonly known as:  ZANTAC TAKE 1 TABLET (150 MG TOTAL) BY MOUTH AT BEDTIME. What changed:    when to take this  additional instructions   traMADol 50 MG tablet Commonly known as:  ULTRAM Take 1 tablet (50 mg total) by mouth every 6 (six) hours as needed. What changed:  reasons to take this   venlafaxine XR 75 MG 24 hr capsule Commonly known as:  EFFEXOR XR Take 1 capsule (75 mg total) by mouth daily with breakfast.            Durable Medical Equipment  (From admission, onward)        Start     Ordered   10/05/17 1345  For home use only DME Walker rolling  Once    Question:  Patient needs a walker to treat with the following condition  Answer:  Weakness   10/05/17 1344   10/05/17 1123  For home use only DME Bedside commode  Once    Question:  Patient needs a bedside commode to treat with the following condition  Answer:  Weakness   10/05/17 1122      Diagnostic Studies: Dg Pelvis Portable  Result Date: 10/03/2017 CLINICAL DATA:  Postop EXAM: PORTABLE PELVIS 1-2 VIEWS COMPARISON:  Portable exam 0957 hours compared to 11/09/2016 FINDINGS: Interval placement of RIGHT hip prosthesis. No fracture, dislocation or bone destruction identified on single AP view.  Bones appear demineralized. Mild degenerative changes of the LEFT hip joint noted. IMPRESSION: RIGHT hip prosthesis without acute abnormalities. Electronically Signed   By: Lavonia Dana M.D.   On: 10/03/2017 10:17   Dg Hip Operative Unilat W Or W/o Pelvis Right  Result Date: 10/03/2017 CLINICAL DATA:  Hip replacement surgery EXAM: OPERATIVE RIGHT HIP (WITH PELVIS IF PERFORMED) 3 VIEWS TECHNIQUE: Fluoroscopic spot image(s)  were submitted for interpretation post-operatively. COMPARISON:  11/09/2016 FINDINGS: A series of 3 fluoroscopic intraoperative images document placement of femoral and acetabular components of right hip arthroplasty, projecting in expected location. No fracture or other apparent complication IMPRESSION: Interval right hip arthroplasty. Electronically Signed   By: Lucrezia Europe M.D.   On: 10/03/2017 11:21    Disposition: Discharge disposition: 01-Home or Self Care            Signed: Lovell Sheehan ,MD 10/06/2017, 12:35 PM

## 2017-10-08 LAB — SURGICAL PATHOLOGY

## 2017-10-09 DIAGNOSIS — M25551 Pain in right hip: Secondary | ICD-10-CM | POA: Diagnosis not present

## 2017-10-09 DIAGNOSIS — R2681 Unsteadiness on feet: Secondary | ICD-10-CM | POA: Diagnosis not present

## 2017-10-09 DIAGNOSIS — M1611 Unilateral primary osteoarthritis, right hip: Secondary | ICD-10-CM | POA: Diagnosis not present

## 2017-10-12 DIAGNOSIS — R2681 Unsteadiness on feet: Secondary | ICD-10-CM | POA: Diagnosis not present

## 2017-10-12 DIAGNOSIS — M25551 Pain in right hip: Secondary | ICD-10-CM | POA: Diagnosis not present

## 2017-10-12 DIAGNOSIS — M1611 Unilateral primary osteoarthritis, right hip: Secondary | ICD-10-CM | POA: Diagnosis not present

## 2017-10-15 DIAGNOSIS — R2681 Unsteadiness on feet: Secondary | ICD-10-CM | POA: Diagnosis not present

## 2017-10-15 DIAGNOSIS — M25551 Pain in right hip: Secondary | ICD-10-CM | POA: Diagnosis not present

## 2017-10-15 DIAGNOSIS — M1611 Unilateral primary osteoarthritis, right hip: Secondary | ICD-10-CM | POA: Diagnosis not present

## 2017-10-19 DIAGNOSIS — M25551 Pain in right hip: Secondary | ICD-10-CM | POA: Diagnosis not present

## 2017-10-19 DIAGNOSIS — M1611 Unilateral primary osteoarthritis, right hip: Secondary | ICD-10-CM | POA: Diagnosis not present

## 2017-10-19 DIAGNOSIS — R2681 Unsteadiness on feet: Secondary | ICD-10-CM | POA: Diagnosis not present

## 2017-10-22 DIAGNOSIS — M1611 Unilateral primary osteoarthritis, right hip: Secondary | ICD-10-CM | POA: Diagnosis not present

## 2017-10-22 DIAGNOSIS — M25551 Pain in right hip: Secondary | ICD-10-CM | POA: Diagnosis not present

## 2017-10-22 DIAGNOSIS — R2681 Unsteadiness on feet: Secondary | ICD-10-CM | POA: Diagnosis not present

## 2017-10-25 ENCOUNTER — Other Ambulatory Visit: Payer: Self-pay

## 2017-10-25 DIAGNOSIS — M150 Primary generalized (osteo)arthritis: Principal | ICD-10-CM

## 2017-10-25 DIAGNOSIS — M159 Polyosteoarthritis, unspecified: Secondary | ICD-10-CM

## 2017-10-25 NOTE — Telephone Encounter (Signed)
Patient's son Lisa Paul is requesting a refill on Tramadol 50 mg [facility] [place] today if possible.  CB#[phone]

## 2017-10-25 NOTE — Telephone Encounter (Signed)
Please review

## 2017-10-29 ENCOUNTER — Other Ambulatory Visit: Payer: Self-pay

## 2017-10-29 ENCOUNTER — Ambulatory Visit: Payer: Self-pay | Admitting: Family Medicine

## 2017-10-29 DIAGNOSIS — M150 Primary generalized (osteo)arthritis: Principal | ICD-10-CM

## 2017-10-29 DIAGNOSIS — M159 Polyosteoarthritis, unspecified: Secondary | ICD-10-CM

## 2017-10-29 MED ORDER — TRAMADOL HCL 50 MG PO TABS: 50.0000 mg | ORAL_TABLET | Freq: Four times a day (QID) | ORAL | 5 refills | Status: DC | PRN

## 2017-11-22 DIAGNOSIS — Z96641 Presence of right artificial hip joint: Secondary | ICD-10-CM | POA: Diagnosis not present

## 2017-12-28 ENCOUNTER — Other Ambulatory Visit: Payer: Self-pay

## 2017-12-28 DIAGNOSIS — Z1231 Encounter for screening mammogram for malignant neoplasm of breast: Secondary | ICD-10-CM

## 2018-01-15 ENCOUNTER — Other Ambulatory Visit: Payer: Self-pay | Admitting: Family Medicine

## 2018-01-15 DIAGNOSIS — K219 Gastro-esophageal reflux disease without esophagitis: Secondary | ICD-10-CM

## 2018-01-22 ENCOUNTER — Other Ambulatory Visit: Payer: Self-pay | Admitting: Family Medicine

## 2018-01-22 DIAGNOSIS — K219 Gastro-esophageal reflux disease without esophagitis: Secondary | ICD-10-CM

## 2018-01-22 NOTE — Telephone Encounter (Signed)
[facility] ref[facility] request for the following medications:  ranitidine (ZANTAC) 150 MG tablet  Qty: 30   It looks like this was printed instead of sent to the pharmacy on 01/15/2018.  Please advise.

## 2018-01-23 ENCOUNTER — Ambulatory Visit (INDEPENDENT_AMBULATORY_CARE_PROVIDER_SITE_OTHER): Payer: Medicare Other

## 2018-01-23 DIAGNOSIS — Z23 Encounter for immunization: Secondary | ICD-10-CM | POA: Diagnosis not present

## 2018-01-24 ENCOUNTER — Ambulatory Visit: Payer: Medicare Other

## 2018-01-24 MED ORDER — RANITIDINE HCL 150 MG PO TABS: ORAL_TABLET | ORAL | 1 refills | Status: DC

## 2018-02-01 ENCOUNTER — Telehealth: Payer: Self-pay | Admitting: Family Medicine

## 2018-02-01 NOTE — Telephone Encounter (Signed)
ok 

## 2018-02-01 NOTE — Telephone Encounter (Signed)
Patient would like a refill on Mirabegron ER 50 mg and I looked in her chart and  you never prescribed this medication for her. Please advise.

## 2018-02-01 NOTE — Telephone Encounter (Signed)
Pt needs a refill on   Mirabegron ER 50 MG  [facility]  CB#  [phone]  Thanks  teri

## 2018-02-04 MED ORDER — MIRABEGRON ER 50 MG PO TB24: 50.0000 mg | ORAL_TABLET | Freq: Every day | ORAL | 3 refills | Status: DC

## 2018-02-04 NOTE — Telephone Encounter (Signed)
Done

## 2018-02-19 ENCOUNTER — Ambulatory Visit: Payer: Medicare Other | Admitting: General Surgery

## 2018-02-25 ENCOUNTER — Ambulatory Visit
Admission: RE | Admit: 2018-02-25 | Discharge: 2018-02-25 | Disposition: A | Discharge status: Home / Self Care | Payer: Medicare Other | Source: Ambulatory Visit | Attending: General Surgery | Admitting: General Surgery

## 2018-02-25 DIAGNOSIS — Z1231 Encounter for screening mammogram for malignant neoplasm of breast: Secondary | ICD-10-CM | POA: Diagnosis not present

## 2018-02-25 HISTORY — DX: Personal history of irradiation: Z92.3

## 2018-02-27 ENCOUNTER — Other Ambulatory Visit: Payer: Self-pay | Admitting: Family Medicine

## 2018-04-04 ENCOUNTER — Telehealth: Payer: Self-pay | Admitting: Family Medicine

## 2018-04-04 ENCOUNTER — Other Ambulatory Visit: Payer: Self-pay | Admitting: Family Medicine

## 2018-04-04 MED ORDER — LOSARTAN POTASSIUM-HCTZ 100-25 MG PO TABS: 1.0000 | ORAL_TABLET | Freq: Every day | ORAL | 3 refills | Status: DC

## 2018-04-04 MED ORDER — FLUTICASONE PROPIONATE 50 MCG/ACT NA SUSP: 2.0000 | Freq: Every day | NASAL | 11 refills | Status: DC

## 2018-04-04 NOTE — Telephone Encounter (Signed)
Pt needing refills on:  losartan-hydrochlorothiazide (HYZAAR) 100-25 MG tablet - 1 tablet left fluticasone (FLONASE) 50 MCG/ACT nasal spray  [facility] [address] [place], [place] [address] Phone: [phone]  Thanks, [facility]

## 2018-04-05 ENCOUNTER — Telehealth: Payer: Self-pay

## 2018-04-05 MED ORDER — LOSARTAN POTASSIUM 50 MG PO TABS: 50.0000 mg | ORAL_TABLET | Freq: Every day | ORAL | 3 refills | Status: DC

## 2018-04-05 MED ORDER — LOSARTAN POTASSIUM 100 MG PO TABS: 100.0000 mg | ORAL_TABLET | Freq: Every day | ORAL | 3 refills | Status: DC

## 2018-04-05 MED ORDER — HYDROCHLOROTHIAZIDE 25 MG PO TABS: 25.0000 mg | ORAL_TABLET | Freq: Every day | ORAL | 3 refills | Status: DC

## 2018-04-05 NOTE — Telephone Encounter (Signed)
Patient is requesting a prescription for losartan and for hydrochlorothiazide [facility] [place]. Patient reports that the combined medication is not in stock. Please advise. Patient request call back when prescription is sent. sd

## 2018-04-05 NOTE — Telephone Encounter (Signed)
Please advise 

## 2018-04-08 MED ORDER — HYDROCHLOROTHIAZIDE 25 MG PO TABS: 25.0000 mg | ORAL_TABLET | Freq: Every day | ORAL | 3 refills | Status: DC

## 2018-04-08 MED ORDER — LOSARTAN POTASSIUM 100 MG PO TABS: 100.0000 mg | ORAL_TABLET | Freq: Every day | ORAL | 3 refills | Status: DC

## 2018-04-08 NOTE — Addendum Note (Signed)
Addended by: Eulas Post on: 04/08/2018 09:03 AM   Modules accepted: Orders

## 2018-04-28 ENCOUNTER — Other Ambulatory Visit: Payer: Self-pay | Admitting: Family Medicine

## 2018-04-28 DIAGNOSIS — K219 Gastro-esophageal reflux disease without esophagitis: Secondary | ICD-10-CM

## 2018-06-05 ENCOUNTER — Ambulatory Visit (INDEPENDENT_AMBULATORY_CARE_PROVIDER_SITE_OTHER): Payer: Medicare Other | Admitting: Urology

## 2018-06-05 ENCOUNTER — Encounter: Payer: Self-pay | Admitting: Urology

## 2018-06-05 VITALS — BP 137/84 | HR 101 | Ht 66.0 in | Wt 256.0 lb

## 2018-06-05 DIAGNOSIS — N3941 Urge incontinence: Secondary | ICD-10-CM

## 2018-06-05 LAB — BLADDER SCAN AMB NON-IMAGING

## 2018-06-05 MED ORDER — MIRABEGRON ER 50 MG PO TB24: 50.0000 mg | ORAL_TABLET | Freq: Every day | ORAL | 3 refills | Status: DC

## 2018-06-05 NOTE — Progress Notes (Signed)
06/05/2018 11:09 AM   Lisa Paul 01-05-1935 [id number]  Referring provider: Jerrol Banana., MD [address] [place], [place] [address]  Chief Complaint  Patient presents with  . urge incontinence   HPI: Lisa Paul is a 84 y.o. female Caucasian who with OAB and urge incontinence who presents today for care and management of urge incontinence.  Background History On 10/18/2016, patient stateed that she has had urinary incontinence for a year or so.  Patient has incontinence with urge and stress.  She was experiencing no incontinent episodes during the day.  She was experiencing no incontinent episodes during the night.  This was on oxybutynin.  Her son stated that this was not the case.  Her incontinence volume was zero.   She was wearing no pads/depends daily.  She was having associated urinary frequency, urgency and nocturia.  She does not have a history of urinary tract infections, STI's or injury to the bladder.   She denied dysuria, gross hematuria, suprapubic pain, back pain, abdominal pain or flank pain.  She had not had any recent fevers, chills, nausea or vomiting.  She does not have a history of nephrolithiasis, GU surgery or GU trauma.  She is post menopausal.  She admitted to constipation.  She was not having pain with bladder filling.  She had not had any recent imaging studies.  She was drinking 1 to 3 caffeinated beverages daily.  Her risk factors for incontinence are obesity, vaginal delivery, a family history of incontinence, age, caffeine, depression, vaginal atrophy and pelvic surgery.  She was taking antihistamines, OAB medication and antidepressants.  The patient was lost to follow-up.  Today (06/05/2018), the patient is  experiencing urgency x 0-3, frequency x 0-3, not restricting fluids to avoid visits to the restroom 0-3, is engaging in toilet mapping, incontinence x 4-7 and nocturia x 0-3.  Her BP is 137/84.  Her PVR is 6 mL.  She has found  that the Myrbetriq has been effective.  Patient denies any autoimmune disorders.  PMH: Past Medical History:  Diagnosis Date  . Allergy   . Anxiety   . Arthritis 1993  . Cancer [facility]) 2012   Left Breast with lumpectomy and mammosite  . Cataract   . Colon polyp 2005  . Dyspnea   . GERD (gastroesophageal reflux disease)   . Hypertension 15 years  . Incontinence of urine   . Lower extremity edema   . Malignant neoplasm of breast (female), unspecified site    left breast  . Neuropathy   . Personal history of radiation therapy 2012   mammosite  . Sleep apnea     Surgical History: Past Surgical History:  Procedure Laterality Date  . ABDOMINAL HYSTERECTOMY  30 years ago  . BREAST BIOPSY Left 2012   invasive mammary carcinoma  . BREAST LUMPECTOMY Left 2012  . CARPAL TUNNEL RELEASE Bilateral   . COLONOSCOPY  05/2003   Dr Nicolasa [facility]ucking [facility]  . COLONOSCOPY  07/24/12  . EYE SURGERY Bilateral 2005   cataract  . JOINT REPLACEMENT Bilateral 2007   knees  . mammosite balloon placement Left 08/2010   Removal 09/2010  . TONSILLECTOMY  age 74  . TOTAL HIP ARTHROPLASTY Right 10/03/2017   Procedure: TOTAL HIP ARTHROPLASTY ANTERIOR APPROACH;  Surgeon: Lovell Sheehan, MD;  Location: [facility] ORS;  Service: Orthopedics;  Laterality: Right;    Home Medications:  Allergies as of 06/05/2018      Reactions   Tape  Paper tape - blisters      Medication List       Accurate as of June 05, 2018 11:09 AM. Always use your most recent med list.        acetaminophen 500 MG tablet Commonly known as:  TYLENOL Take 2 tablets (1,000 mg total) by mouth every 8 (eight) hours as needed.   aspirin 81 MG chewable tablet Chew 1 tablet (81 mg total) by mouth 2 (two) times daily.   B-12 1000 MCG Tabs Take 1 tablet by mouth daily.   diphenhydrAMINE 25 MG tablet Commonly known as:  BENADRYL Take 25 mg by mouth at bedtime as needed (for sleep & itching.).   docusate sodium 100 MG capsule Commonly known as:   COLACE Take 1 capsule (100 mg total) by mouth 2 (two) times daily.   DULoxetine 30 MG capsule Commonly known as:  CYMBALTA Take 1 capsule (30 mg total) by mouth daily.   fluticasone 50 MCG/ACT nasal spray Commonly known as:  FLONASE Place 2 sprays into both nostrils daily.   hydrochlorothiazide 25 MG tablet Commonly known as:  HYDRODIURIL Take 1 tablet (25 mg total) by mouth daily.   HYDROcodone-acetaminophen 5-325 MG tablet Commonly known as:  NORCO/VICODIN Take 1 tablet by mouth every 6 (six) hours as needed for moderate pain (pain score 4-6).   losartan 100 MG tablet Commonly known as:  COZAAR Take 1 tablet (100 mg total) by mouth daily.   losartan-hydrochlorothiazide 100-25 MG tablet Commonly known as:  HYZAAR losartan 100 mg-hydrochlorothiazide 25 mg tablet  TAKE 1 TABLET BY MOUTH EVERY DAY   LUBRICANT EYE DROPS 0.4-0.3 % Soln Generic drug:  Polyethyl Glycol-Propyl Glycol Place 1 drop into both eyes 2 (two) times daily as needed (for dry eyes.).   mirabegron ER 50 MG Tb24 tablet Commonly known as:  MYRBETRIQ Take 1 tablet (50 mg total) by mouth daily.   multivitamin with minerals Tabs tablet Take 1 tablet by mouth daily. Women's Daily Multivitamin   ranitidine 150 MG tablet Commonly known as:  ZANTAC TAKE 1 TABLET BY MOUTH IN THE MORNING. MAY REPEAT DOSE IN THE EVENING IF NEEDED FOR ACID REFLUX OR INDIGESTION.   traMADol 50 MG tablet Commonly known as:  ULTRAM Take 1 tablet (50 mg total) by mouth every 6 (six) hours as needed (for pain.).   venlafaxine XR 75 MG 24 hr capsule Commonly known as:  EFFEXOR XR Take 1 capsule (75 mg total) by mouth daily with breakfast.       Allergies:  Allergies  Allergen Reactions  . Tape     Paper tape - blisters    Family History: Family History  Problem Relation Age of Onset  . Colon cancer Mother   . Stroke Brother        possibly. Pt is unsure.  . Breast cancer Maternal Aunt   . Prostate cancer Neg Hx   .  Bladder Cancer Neg Hx   . Kidney cancer Neg Hx     Social History:  reports that she has never smoked. She has never used smokeless tobacco. She reports that she does not drink alcohol or use drugs.  ROS: UROLOGY Frequent Urination?: Yes Hard to postpone urination?: Yes Burning/pain with urination?: No Get up at night to urinate?: No Leakage of urine?: Yes Urine stream starts and stops?: No Trouble starting stream?: No Do you have to strain to urinate?: No Blood in urine?: No Urinary tract infection?: No Sexually transmitted disease?: No Injury to kidneys  or bladder?: No Painful intercourse?: No Weak stream?: No Currently pregnant?: No Vaginal bleeding?: No Last menstrual period?: n  Gastrointestinal Nausea?: No Vomiting?: No Indigestion/heartburn?: Yes Diarrhea?: No Constipation?: Yes  Constitutional Fever: No Night sweats?: No Weight loss?: No Fatigue?: No  Skin Skin rash/lesions?: No Itching?: No  Eyes Blurred vision?: No Double vision?: No  Ears/Nose/Throat Sore throat?: No Sinus problems?: No  Hematologic/Lymphatic Swollen glands?: No Easy bruising?: Yes  Cardiovascular Leg swelling?: Yes Chest pain?: No  Respiratory Cough?: No Shortness of breath?: No  Endocrine Excessive thirst?: No  Musculoskeletal Back pain?: Yes Joint pain?: Yes  Neurological Headaches?: No Dizziness?: No  Psychologic Depression?: No Anxiety?: Yes  Physical Exam: BP 137/84 (BP Location: Left Arm, Patient Position: Sitting)   Pulse (!) 101   Ht 5\' 6"  (1.676 m)   Wt 256 lb (116.1 kg)   BMI 41.32 kg/m   Constitutional: Well nourished. Alert and oriented, No acute distress. Cardiovascular: No clubbing, cyanosis, or edema. Respiratory: Normal respiratory effort, no increased work of breathing. Skin: No rashes, bruises or suspicious lesions. Neurologic: Grossly intact, no focal deficits, moving all 4 extremities. Psychiatric: Normal mood and affect.    Laboratory Data: Lab Results  Component Value Date   WBC 9.6 10/06/2017   HGB 11.6 (L) 10/06/2017   HCT 34.5 (L) 10/06/2017   MCV 94.0 10/06/2017   PLT 258 10/06/2017    Lab Results  Component Value Date   CREATININE 0.82 10/04/2017    Lab Results  Component Value Date   TSH 1.520 07/30/2017   Lab Results  Component Value Date   AST 15 07/30/2017   Lab Results  Component Value Date   ALT 12 07/30/2017   I have independently reviewed the labs.  Pertinent Imaging:  Results for orders placed or performed in visit on 06/05/18  BLADDER SCAN AMB NON-IMAGING  Result Value Ref Range   Scan Result 36ml    Assessment & Plan:    1. Mixed incontinence - Patient had memory issues and has had to discontinue oxybutynin - Patient has found Myrbetriq 50 mg effective. - Myrbetriq [facility] [place] in 1 year for PVR and symptom recheck   2. Vaginal atrophy -  Do not think patient will be able to apply the cream as directed due to memory issues  Return in about 1 year (around 06/05/2019) for PVR and OAB questionnaire.  Zara Council, PA-C  [facility] [address] [place], [place] [address] [phone]  I, Adele Schilder, am acting as a Education administrator for [employer], PA-C.   I have reviewed the above documentation for accuracy and completeness, and I agree with the above.    Zara Council, PA-C

## 2018-07-09 ENCOUNTER — Ambulatory Visit: Payer: Medicare Other | Admitting: General Surgery

## 2018-08-08 ENCOUNTER — Telehealth: Payer: Self-pay

## 2018-08-08 NOTE — Telephone Encounter (Signed)
LMTCB about PHQ screening

## 2018-10-23 ENCOUNTER — Encounter: Payer: Self-pay | Admitting: General Surgery

## 2018-12-16 ENCOUNTER — Other Ambulatory Visit: Payer: Self-pay

## 2018-12-16 DIAGNOSIS — Z1231 Encounter for screening mammogram for malignant neoplasm of breast: Secondary | ICD-10-CM

## 2019-01-14 ENCOUNTER — Ambulatory Visit (INDEPENDENT_AMBULATORY_CARE_PROVIDER_SITE_OTHER): Payer: Medicare Other

## 2019-01-14 ENCOUNTER — Other Ambulatory Visit: Payer: Self-pay

## 2019-01-14 DIAGNOSIS — Z23 Encounter for immunization: Secondary | ICD-10-CM | POA: Diagnosis not present

## 2019-03-03 ENCOUNTER — Ambulatory Visit
Admission: RE | Admit: 2019-03-03 | Discharge: 2019-03-03 | Disposition: A | Discharge status: Home / Self Care | Payer: Medicare Other | Source: Ambulatory Visit | Attending: Surgery | Admitting: Surgery

## 2019-03-03 DIAGNOSIS — Z1231 Encounter for screening mammogram for malignant neoplasm of breast: Secondary | ICD-10-CM | POA: Diagnosis not present

## 2019-03-10 ENCOUNTER — Ambulatory Visit: Payer: Medicare Other | Admitting: Surgery

## 2019-03-11 ENCOUNTER — Other Ambulatory Visit: Payer: Self-pay

## 2019-03-11 ENCOUNTER — Ambulatory Visit (INDEPENDENT_AMBULATORY_CARE_PROVIDER_SITE_OTHER): Payer: Medicare Other | Admitting: Surgery

## 2019-03-11 ENCOUNTER — Encounter: Payer: Self-pay | Admitting: Surgery

## 2019-03-11 VITALS — BP 137/58 | HR 111 | Temp 96.1°F | Ht 66.0 in | Wt 260.2 lb

## 2019-03-11 DIAGNOSIS — Z853 Personal history of malignant neoplasm of breast: Secondary | ICD-10-CM | POA: Diagnosis not present

## 2019-03-11 NOTE — Progress Notes (Signed)
Surgical Consultation  03/11/2019  Lisa Paul is an 84 y.o. female.   Apparently she had a lumpectomy which developed a large open wound and subsequently regressed into a dense thickened scar of the left axilla.  She reports is not changed over the last year.  She is concerned that perhaps it could recur.  She otherwise had a screening mammogram which remains completely negative.   Past Medical History:  Diagnosis Date  . Allergy   . Anxiety   . Arthritis 1993  . Cancer [facility]) 2012   Left Breast with lumpectomy and mammosite  . Cataract   . Colon polyp 2005  . Dyspnea   . GERD (gastroesophageal reflux disease)   . Hypertension 15 years  . Incontinence of urine   . Lower extremity edema   . Malignant neoplasm of breast (female), unspecified site    left breast  . Neuropathy   . Personal history of radiation therapy 2012   mammosite  . Sleep apnea     Past Surgical History:  Procedure Laterality Date  . ABDOMINAL HYSTERECTOMY  30 years ago  . BREAST BIOPSY Left 2012   invasive mammary carcinoma  . BREAST LUMPECTOMY Left 2012  . CARPAL TUNNEL RELEASE Bilateral   . COLONOSCOPY  05/2003   Dr Nicolasa Ducking [facility]  . COLONOSCOPY  07/24/12  . EYE SURGERY Bilateral 2005   cataract  . JOINT REPLACEMENT Bilateral 2007   knees  . mammosite balloon placement Left 08/2010   Removal 09/2010  . TONSILLECTOMY  age 33  . TOTAL HIP ARTHROPLASTY Right 10/03/2017   Procedure: TOTAL HIP ARTHROPLASTY ANTERIOR APPROACH;  Surgeon: Lovell Sheehan, MD;  Location: [facility] ORS;  Service: Orthopedics;  Laterality: Right;    Family History  Problem Relation Age of Onset  . Colon cancer Mother   . Stroke Brother        possibly. Pt is unsure.  . Breast cancer Maternal Aunt   . Prostate cancer Neg Hx   . Bladder Cancer Neg Hx   . Kidney cancer Neg Hx     Social History:  reports that she has never smoked. She has never used smokeless tobacco. She reports that she does not drink alcohol or use drugs.   Allergies:  Allergies  Allergen Reactions  . Tape     Paper tape - blisters    Medications reviewed.   Review of Systems:   Review of Systems  All other systems reviewed and are negative.    Physical Exam:  BP (!) 137/58   Pulse (!) 111   Temp (!) 96.1 F (35.6 C) (Temporal)   Ht 5\' 6"  (1.676 m)   Wt 260 lb 3.2 oz (118 kg)   SpO2 98%   BMI 42.00 kg/m   Physical Exam  Breast Exam: No appreciable nodularity or suspicious or dominant masses.  Masslike left axillary scar noted with overlying spiraling epidermal scarring.  Destiny present as chaperone.          No results found for this or any previous visit (from the past 48 hour(s)). No results found.   Assessment/Plan: Follow-up of history of left breast cancer.  Chronic scar left axilla without change.  Negative screening mammogram follow-up.  We will continue her annual screening exams.   Ronny Bacon

## 2019-03-11 NOTE — Patient Instructions (Addendum)
Follow-up with our office as needed. Please call and ask to speak with a nurse if you develop questions or concerns.  Breast Self-Awareness Breast self-awareness means being familiar with how your breasts look and feel. It involves checking your breasts regularly and reporting any changes to your health care provider. Practicing breast self-awareness is important. Sometimes changes may not be harmful (are benign), but sometimes a change in your breasts can be a sign of a serious medical problem. It is important to learn how to do this procedure correctly so that you can catch problems early, when treatment is more likely to be successful. All women should practice breast self-awareness, including women who have had breast implants. What you need:  A mirror.  A well-lit room. How to do a breast self-exam A breast self-exam is one way to learn what is normal for your breasts and whether your breasts are changing. To do a breast self-exam: Look for changes  1. Remove all the clothing above your waist. 2. Stand in front of a mirror in a room with good lighting. 3. Put your hands on your hips. 4. Push your hands firmly downward. 5. Compare your breasts in the mirror. Look for differences between them (asymmetry), such as: ? Differences in shape. ? Differences in size. ? Puckers, dips, and bumps in one breast and not the other. 6. Look at each breast for changes in the skin, such as: ? Redness. ? Scaly areas. 7. Look for changes in your nipples, such as: ? Discharge. ? Bleeding. ? Dimpling. ? Redness. ? A change in position. Feel for changes Carefully feel your breasts for lumps and changes. It is best to do this while lying on your back on the floor, and again while sitting or standing in the tub or shower with soapy water on your skin. Feel each breast in the following way: 1. Place the arm on the side of the breast you are examining above your head. 2. Feel your breast with the  other hand. 3. Start in the nipple area and make -inch (2 cm) overlapping circles to feel your breast. Use the pads of your three middle fingers to do this. Apply light pressure, then medium pressure, then firm pressure. The light pressure will allow you to feel the tissue closest to the skin. The medium pressure will allow you to feel the tissue that is a little deeper. The firm pressure will allow you to feel the tissue close to the ribs. 4. Continue the overlapping circles, moving downward over the breast until you feel your ribs below your breast. 5. Move one finger-width toward the center of the body. Continue to use the -inch (2 cm) overlapping circles to feel your breast as you move slowly up toward your collarbone. 6. Continue the up-and-down exam using all three pressures until you reach your armpit.  Write down what you find Writing down what you find can help you remember what to discuss with your health care provider. Write down:  What is normal for each breast.  Any changes that you find in each breast, including: ? The kind of changes you find. ? Any pain or tenderness. ? Size and location of any lumps.  Where you are in your menstrual cycle, if you are still menstruating. General tips and recommendations  Examine your breasts every month.  If you are breastfeeding, the best time to examine your breasts is after a feeding or after using a breast pump.  If you menstruate, the   best time to examine your breasts is 5-7 days after your period. Breasts are generally lumpier during menstrual periods, and it may be more difficult to notice changes.  With time and practice, you will become more familiar with the variations in your breasts and more comfortable with the exam. Contact a health care provider if you:  See a change in the shape or size of your breasts or nipples.  See a change in the skin of your breast or nipples, such as a reddened or scaly area.  Have unusual  discharge from your nipples.  Find a lump or thick area that was not there before.  Have pain in your breasts.  Have any concerns related to your breast health. Summary  Breast self-awareness includes looking for physical changes in your breasts, as well as feeling for any changes within your breasts.  Breast self-awareness should be performed in front of a mirror in a well-lit room.  You should examine your breasts every month. If you menstruate, the best time to examine your breasts is 5-7 days after your menstrual period.  Let your health care provider know of any changes you notice in your breasts, including changes in size, changes on the skin, pain or tenderness, or unusual fluid from your nipples. This information is not intended to replace advice given to you by your health care provider. Make sure you discuss any questions you have with your health care provider. Document Released: 03/20/2005 Document Revised: 11/06/2017 Document Reviewed: 11/06/2017 Elsevier Patient Education  2020 Elsevier Inc.  

## 2019-03-17 ENCOUNTER — Telehealth: Payer: Self-pay

## 2019-03-17 NOTE — Telephone Encounter (Signed)
Spoke with patient to notify her of her recent mammogram results. Patient notified and verbalizes understanding.

## 2019-03-17 NOTE — Telephone Encounter (Signed)
-----   Message from Jules Husbands, MD sent at 03/17/2019  9:04 AM EST ----- Please let her know her mammo was nml ----- Message ----- From: Interface, Rad Results In Sent: 03/03/2019   2:59 PM EST To: Jules Husbands, MD

## 2019-04-09 ENCOUNTER — Other Ambulatory Visit: Payer: Self-pay | Admitting: Family Medicine

## 2019-04-09 NOTE — Telephone Encounter (Signed)
Requested medication (s) are due for refill today: yes  Requested medication (s) are on the active medication list: yes  Last refill:  01/10/2019  Future visit scheduled: no  Notes to clinic:  no valid encounter in last 6 months    Requested Prescriptions  Pending Prescriptions Disp Refills   hydrochlorothiazide (HYDRODIURIL) 25 MG tablet [Pharmacy Med Name: HYDROCHLOROTHIAZIDE 25 MG TAB] 90 tablet 3    Sig: TAKE 1 TABLET BY MOUTH EVERY DAY      Cardiovascular: Diuretics - Thiazide Failed - 04/09/2019  1:32 AM      Failed - Ca in normal range and within 360 days    Calcium  Date Value Ref Range Status  10/04/2017 8.4 (L) 8.9 - 10.3 mg/dL Final          Failed - Cr in normal range and within 360 days    Creatinine, Ser  Date Value Ref Range Status  10/04/2017 0.82 0.44 - 1.00 mg/dL Final          Failed - K in normal range and within 360 days    Potassium  Date Value Ref Range Status  10/04/2017 4.3 3.5 - 5.1 mmol/L Final          Failed - Na in normal range and within 360 days    Sodium  Date Value Ref Range Status  10/04/2017 139 135 - 145 mmol/L Final  07/30/2017 139 134 - 144 mmol/L Final          Failed - Valid encounter within last 6 months    Recent Outpatient Visits           1 year ago Preoperative clearance   [facility] Jerrol Banana., MD   1 year ago Depression, unspecified depression type   [facility] Jerrol Banana., MD   2 years ago Primary osteoarthritis involving multiple joints   [facility] Jerrol Banana., MD   2 years ago Depression, unspecified depression type   [facility] Jerrol Banana., MD   2 years ago Pedal edema   [facility] Jerrol Banana., MD       Future Appointments             In 1 month McGowan, Gordan Payment [facility]            Passed - Last BP in normal range    BP Readings  from Last 1 Encounters:  03/11/19 (!) 137/58

## 2019-04-15 ENCOUNTER — Other Ambulatory Visit: Payer: Self-pay | Admitting: Family Medicine

## 2019-04-15 NOTE — Telephone Encounter (Signed)
Requested medication (s) are due for refill today:yes  Requested medication (s) are on the active medication list: yes  Last refill:  03/19/2019  Future visit scheduled: no  Notes to clinic: no valid encounter within last 12 months Review for refill   Requested Prescriptions  Pending Prescriptions Disp Refills   fluticasone (FLONASE) 50 MCG/ACT nasal spray [Pharmacy Med Name: FLUTICASONE PROP 50 MCG SPRAY] 16 mL 11    Sig: SPRAY 2 SPRAYS INTO EACH NOSTRIL EVERY DAY      Ear, Nose, and Throat: Nasal Preparations - Corticosteroids Failed - 04/15/2019  2:01 AM      Failed - Valid encounter within last 12 months    Recent Outpatient Visits           1 year ago Preoperative clearance   [facility] Jerrol Banana., MD   1 year ago Depression, unspecified depression type   [facility] Jerrol Banana., MD   2 years ago Primary osteoarthritis involving multiple joints   [facility] Jerrol Banana., MD   2 years ago Depression, unspecified depression type   [facility] Jerrol Banana., MD   2 years ago Pedal edema   [facility] Jerrol Banana., MD       Future Appointments             In 1 month McGowan, Gordan Payment [facility]

## 2019-04-24 ENCOUNTER — Ambulatory Visit: Payer: Medicare PPO | Attending: Internal Medicine

## 2019-04-24 NOTE — Progress Notes (Signed)
   Covid-19 Vaccination Clinic  Name:  Lisa Paul    MRN: [id number] DOB: [birth year]  04/24/2019  Lisa Paul was observed post Covid-19 immunization for 15 minutes without incidence. She was provided with Vaccine Information Sheet and instruction to access the V-Safe system.   Lisa Paul was instructed to call 911 with any severe reactions post vaccine: Marland Kitchen Difficulty breathing  . Swelling of your face and throat  . A fast heartbeat  . A bad rash all over your body  . Dizziness and weakness

## 2019-05-02 ENCOUNTER — Other Ambulatory Visit: Payer: Self-pay | Admitting: Family Medicine

## 2019-05-12 ENCOUNTER — Other Ambulatory Visit: Payer: Self-pay | Admitting: Family Medicine

## 2019-05-12 DIAGNOSIS — I10 Essential (primary) hypertension: Secondary | ICD-10-CM

## 2019-05-12 NOTE — Telephone Encounter (Signed)
Called to schedule patient for visit, patient hasn't been seen since 2019. Left voicemail for call back.

## 2019-05-12 NOTE — Telephone Encounter (Signed)
Requested medication (s) are due for refill today:   Yes  Requested medication (s) are on the active medication list:   Yes  Future visit scheduled:   No   Last ordered: 04/09/2019  #30  0 refills.    Needs to be seen.    30 day courtesy supply given in Jan.   Requested Prescriptions  Pending Prescriptions Disp Refills   hydrochlorothiazide (HYDRODIURIL) 25 MG tablet [Pharmacy Med Name: HYDROCHLOROTHIAZIDE 25 MG TAB] 30 tablet 0    Sig: TAKE 1 TABLET BY MOUTH EVERY DAY      Cardiovascular: Diuretics - Thiazide Failed - 05/12/2019  1:36 AM      Failed - Ca in normal range and within 360 days    Calcium  Date Value Ref Range Status  10/04/2017 8.4 (L) 8.9 - 10.3 mg/dL Final          Failed - Cr in normal range and within 360 days    Creatinine, Ser  Date Value Ref Range Status  10/04/2017 0.82 0.44 - 1.00 mg/dL Final          Failed - K in normal range and within 360 days    Potassium  Date Value Ref Range Status  10/04/2017 4.3 3.5 - 5.1 mmol/L Final          Failed - Na in normal range and within 360 days    Sodium  Date Value Ref Range Status  10/04/2017 139 135 - 145 mmol/L Final  07/30/2017 139 134 - 144 mmol/L Final          Failed - Valid encounter within last 6 months    Recent Outpatient Visits           1 year ago Preoperative clearance   [facility] Jerrol Banana., MD   1 year ago Depression, unspecified depression type   [facility] Jerrol Banana., MD   2 years ago Primary osteoarthritis involving multiple joints   [facility] Jerrol Banana., MD   2 years ago Depression, unspecified depression type   [facility] Jerrol Banana., MD   2 years ago Pedal edema   [facility] Jerrol Banana., MD       Future Appointments             In 3 weeks McGowan, Gordan Payment [facility]            Passed - Last BP in  normal range    BP Readings from Last 1 Encounters:  03/11/19 (!) 137/58

## 2019-05-12 NOTE — Telephone Encounter (Signed)
Has not been seen since 09/2017

## 2019-05-15 ENCOUNTER — Ambulatory Visit: Payer: Medicare PPO | Attending: Internal Medicine

## 2019-05-15 DIAGNOSIS — Z23 Encounter for immunization: Secondary | ICD-10-CM

## 2019-05-15 NOTE — Progress Notes (Signed)
   Covid-19 Vaccination Clinic  Name:  Lisa Paul    MRN: [id number] DOB: January 10, 1935  05/15/2019  Lisa Paul was observed post Covid-19 immunization for 15 minutes without incidence. She was provided with Vaccine Information Sheet and instruction to access the V-Safe system.   Lisa Paul was instructed to call 911 with any severe reactions post vaccine: Marland Kitchen Difficulty breathing  . Swelling of your face and throat  . A fast heartbeat  . A bad rash all over your body  . Dizziness and weakness    Immunizations Administered    Name Date Dose VIS Date Route   Pfizer COVID-19 Vaccine 05/15/2019 12:25 PM 0.3 mL 03/14/2019 Intramuscular   Manufacturer: [place]   Lot: [id number]   [place]: [id number]

## 2019-06-01 ENCOUNTER — Other Ambulatory Visit: Payer: Self-pay | Admitting: Family Medicine

## 2019-06-02 ENCOUNTER — Other Ambulatory Visit: Payer: Self-pay | Admitting: Family Medicine

## 2019-06-02 ENCOUNTER — Telehealth: Payer: Self-pay | Admitting: *Deleted

## 2019-06-02 NOTE — Telephone Encounter (Signed)
LOV 09/19/17. Request refused at this time.  Appointment made for 06/09/19@11 :20am. Patient will need refill for this medication at that visit. Per patient she has enough medication till then and will need to speak about possible different pharmacy.

## 2019-06-02 NOTE — Telephone Encounter (Signed)
Refill request from patient's pharmacy. TC to patient for appointment in order to continue refills. Appointment made for 06/09/19@11 :20am.  Patient's LOV 09/19/17. Patient will need refill for Losartan (Cozaar) 100 mg tab.

## 2019-06-05 ENCOUNTER — Ambulatory Visit (INDEPENDENT_AMBULATORY_CARE_PROVIDER_SITE_OTHER): Payer: Medicare PPO | Admitting: Urology

## 2019-06-05 ENCOUNTER — Encounter: Payer: Self-pay | Admitting: Urology

## 2019-06-05 ENCOUNTER — Other Ambulatory Visit: Payer: Self-pay

## 2019-06-05 VITALS — BP 133/76 | HR 88 | Wt 250.0 lb

## 2019-06-05 DIAGNOSIS — N3941 Urge incontinence: Secondary | ICD-10-CM

## 2019-06-05 LAB — BLADDER SCAN AMB NON-IMAGING: Scan Result: 0

## 2019-06-05 MED ORDER — MIRABEGRON ER 50 MG PO TB24: 50.0000 mg | ORAL_TABLET | Freq: Every day | ORAL | 3 refills | Status: DC

## 2019-06-05 NOTE — Progress Notes (Signed)
06/05/2018 9:42 PM   Lisa Paul Sep 14, 1934 [id number]  Referring provider: Jerrol Banana., MD [address] [place],  [place] [address]  Chief Complaint  Patient presents with  . Urinary Incontinence   HPI: Lisa Paul is a 85 y.o. female  who with OAB and urge incontinence who presents today for yearly follow up.  The patient is experiencing urgency x 0-3 (stable), frequency x (stable), not restricting fluids to avoid visits to the restroom, is engaging in toilet mapping, incontinence x 0-3 (improved) and nocturia x 0-3 (improved).   Her BP is 133/76.   Her PVR is 0 mL.  Patient denies any modifying or aggravating factors.  Patient denies any gross hematuria, dysuria or suprapubic/flank pain.  Patient denies any fevers, chills, nausea or vomiting.   She is not using the vaginal estrogen cream.  She denied any vaginal dryness, itching or discharge.    PMH: Past Medical History:  Diagnosis Date  . Allergy   . Anxiety   . Arthritis 1993  . Cancer [facility]) 2012   Left Breast with lumpectomy and mammosite  . Cataract   . Colon polyp 2005  . Dyspnea   . GERD (gastroesophageal reflux disease)   . Hypertension 15 years  . Incontinence of urine   . Lower extremity edema   . Malignant neoplasm of breast (female), unspecified site    left breast  . Neuropathy   . Personal history of radiation therapy 2012   mammosite  . Sleep apnea     Surgical History: Past Surgical History:  Procedure Laterality Date  . ABDOMINAL HYSTERECTOMY  30 years ago  . BREAST BIOPSY Left 2012   invasive mammary carcinoma  . BREAST LUMPECTOMY Left 2012  . CARPAL TUNNEL RELEASE Bilateral   . COLONOSCOPY  05/2003   Dr Nicolasa Ducking [facility]  . COLONOSCOPY  07/24/12  . EYE SURGERY Bilateral 2005   cataract  . JOINT REPLACEMENT Bilateral 2007   knees  . mammosite balloon placement Left 08/2010   Removal 09/2010  . TONSILLECTOMY  age 3  . TOTAL HIP ARTHROPLASTY Right 10/03/2017   Procedure: TOTAL HIP ARTHROPLASTY ANTERIOR APPROACH;  Surgeon: Lovell Sheehan, MD;  Location: [facility] ORS;  Service: Orthopedics;  Laterality: Right;    Home Medications:  Allergies as of 06/05/2019      Reactions   Tape    Paper tape - blisters      Medication List       Accurate as of June 05, 2019 11:59 PM. If you have any questions, ask your nurse or doctor.        STOP taking these medications   aspirin 81 MG chewable tablet Stopped by: Piotr Christopher, PA-C   diphenhydrAMINE 25 MG tablet Commonly known as: BENADRYL Stopped by: Zara Council, PA-C     TAKE these medications   acetaminophen 500 MG tablet Commonly known as: TYLENOL Take 2 tablets (1,000 mg total) by mouth every 8 (eight) hours as needed. What changed:   how much to take  reasons to take this   B-12 1000 MCG Tabs Take 1 tablet by mouth daily.   fluticasone 50 MCG/ACT nasal spray Commonly known as: FLONASE SPRAY 2 SPRAYS INTO EACH NOSTRIL EVERY DAY   hydrochlorothiazide 25 MG tablet Commonly known as: HYDRODIURIL TAKE 1 TABLET BY MOUTH EVERY DAY   losartan 100 MG tablet Commonly known as: COZAAR TAKE 1 TABLET BY MOUTH EVERY DAY   losartan-hydrochlorothiazide 100-25 MG tablet  Commonly known as: HYZAAR losartan 100 mg-hydrochlorothiazide 25 mg tablet  TAKE 1 TABLET BY MOUTH EVERY DAY   Lubricant Eye Drops 0.4-0.3 % Soln Generic drug: Polyethyl Glycol-Propyl Glycol Place 1 drop into both eyes 2 (two) times daily as needed (for dry eyes.).   mirabegron ER 50 MG Tb24 tablet Commonly known as: MYRBETRIQ Take 1 tablet (50 mg total) by mouth daily.   multivitamin with minerals Tabs tablet Take 1 tablet by mouth daily. Women's Daily Multivitamin   ranitidine 150 MG tablet Commonly known as: ZANTAC TAKE 1 TABLET BY MOUTH IN THE MORNING. MAY REPEAT DOSE IN THE EVENING IF NEEDED FOR ACID REFLUX OR INDIGESTION.       Allergies:  Allergies  Allergen Reactions  . Tape     Paper tape -  blisters    Family History: Family History  Problem Relation Age of Onset  . Colon cancer Mother   . Stroke Brother        possibly. Pt is unsure.  . Breast cancer Maternal Aunt   . Prostate cancer Neg Hx   . Bladder Cancer Neg Hx   . Kidney cancer Neg Hx     Social History:  reports that she has never smoked. She has never used smokeless tobacco. She reports that she does not drink alcohol or use drugs.  ROS: For pertinent review of systems please refer to history of present illness  Physical Exam: BP 133/76   Pulse 88   Wt 250 lb (113.4 kg)   BMI 40.35 kg/m   Constitutional:  Well nourished. Alert and oriented, No acute distress. HEENT: [place] AT, mask in place.  Trachea midline, no masses. Cardiovascular: No clubbing, cyanosis, or edema. Respiratory: Normal respiratory effort, no increased work of breathing. Neurologic: Grossly intact, no focal deficits, moving all 4 extremities. Psychiatric: Normal mood and affect.   Laboratory Data: Lab Results  Component Value Date   WBC 9.6 10/06/2017   HGB 11.6 (L) 10/06/2017   HCT 34.5 (L) 10/06/2017   MCV 94.0 10/06/2017   PLT 258 10/06/2017    Lab Results  Component Value Date   CREATININE 0.82 10/04/2017    Lab Results  Component Value Date   TSH 1.520 07/30/2017   Lab Results  Component Value Date   AST 15 07/30/2017   Lab Results  Component Value Date   ALT 12 07/30/2017   I have independently reviewed the labs.  Pertinent Imaging:  Results for orders placed or performed in visit on 06/05/19  Bladder Scan (Post Void Residual) in office  Result Value Ref Range   Scan Result 0    Assessment & Plan:    1. Mixed incontinence Patient doing well on Myrbetriq 50 mg, but it is cost prohibitive and she is hoping by [employer] it can become more affordable Given Myrbetriq 50 mg samples and prescription sent to pharmacy Return in 1 year for OAB questionnaire and PVR  2. Vaginal atrophy -   Patient has not had any vaginal dryness or irritation  Return in about 1 year (around 06/04/2020) for PVR and OAB questionnaire.  Zara Council, PA-C  [facility] [address] [place], [place] [address] [phone]

## 2019-06-06 NOTE — Progress Notes (Signed)
Patient: Lisa Paul Female    DOB: April 25, 1934   85 y.o.   MRN: [id number] Visit Date: 06/09/2019  Today's Provider: Wilhemena Durie, MD   Chief Complaint  Patient presents with  . Medication Refill   Subjective:     HPI  Patient comes back in surprisingly for follow-up.  Last time she was here she was moving to [place] with her son.  Evidently now she is back to living in [facility].  I do not know the situation but the patient apparently feels safe. She does complain of her nares itching in her external ear causing problems at times.  She states it has been treated in the past by ENT, Dr. Richardson Landry. She was on Zantac for years and it has been pulled off the market so she is now on famotidine.  GERD is controlled with this. She states she has stopped Cymbalta.  She states she is feeling fine emotionally without this. Obesity From 09/19/2017-Pt moving to [place] with her son who lives there. She makes it clear today that her son Lisa Paul and not her son Lisa Paul has Elrama and Durable POA. She is mentally completely competent and able to make this decision.   Hypertension From 09/19/2017-on losartan-HCTZ.  Mild Depression From 09/19/2017-Improved.   Allergies  Allergen Reactions  . Tape     Paper tape - blisters     Current Outpatient Medications:  .  acetaminophen (TYLENOL) 500 MG tablet, Take 2 tablets (1,000 mg total) by mouth every 8 (eight) hours as needed. (Patient taking differently: Take 500 mg by mouth every 8 (eight) hours as needed (for pain. (with tramadol)). ), Disp: 30 tablet, Rfl: 0 .  Cyanocobalamin (B-12) 1000 MCG TABS, Take 1 tablet by mouth daily. (Patient taking differently: Take 1,000 mcg by mouth daily. ), Disp: 30 tablet, Rfl:  .  fluticasone (FLONASE) 50 MCG/ACT nasal spray, SPRAY 2 SPRAYS INTO EACH NOSTRIL EVERY DAY, Disp: 16 mL, Rfl: 11 .  hydrochlorothiazide (HYDRODIURIL) 25 MG tablet, TAKE 1 TABLET BY MOUTH EVERY DAY, Disp: 30 tablet,  Rfl: 0 .  losartan (COZAAR) 100 MG tablet, TAKE 1 TABLET BY MOUTH EVERY DAY, Disp: 30 tablet, Rfl: 0 .  losartan-hydrochlorothiazide (HYZAAR) 100-25 MG tablet, losartan 100 mg-hydrochlorothiazide 25 mg tablet  TAKE 1 TABLET BY MOUTH EVERY DAY, Disp: , Rfl:  .  mirabegron ER (MYRBETRIQ) 50 MG TB24 tablet, Take 1 tablet (50 mg total) by mouth daily., Disp: 90 tablet, Rfl: 3 .  Multiple Vitamin (MULTIVITAMIN WITH MINERALS) TABS tablet, Take 1 tablet by mouth daily. Women's Daily Multivitamin, Disp: , Rfl:  .  Polyethyl Glycol-Propyl Glycol (LUBRICANT EYE DROPS) 0.4-0.3 % SOLN, Place 1 drop into both eyes 2 (two) times daily as needed (for dry eyes.)., Disp: , Rfl:  .  ranitidine (ZANTAC) 150 MG tablet, TAKE 1 TABLET BY MOUTH IN THE MORNING. MAY REPEAT DOSE IN THE EVENING IF NEEDED FOR ACID REFLUX OR INDIGESTION., Disp: 60 tablet, Rfl: 1  Review of Systems  Constitutional: Negative for appetite change, chills, fatigue and fever.  Eyes: Negative.   Respiratory: Negative for chest tightness and shortness of breath.   Cardiovascular: Negative for chest pain and palpitations.  Gastrointestinal: Negative for abdominal pain, nausea and vomiting.  Endocrine: Negative.   Musculoskeletal: Positive for arthralgias.  Skin: Negative.   Allergic/Immunologic: Negative.   Neurological: Negative for dizziness and weakness.  Psychiatric/Behavioral: Negative.     Social History   Tobacco Use  .  Smoking status: Never Smoker  . Smokeless tobacco: Never Used  Substance Use Topics  . Alcohol use: No      Objective:   There were no vitals taken for this visit. There were no vitals filed for this visit.There is no height or weight on file to calculate BMI.   Physical Exam Vitals reviewed.  Constitutional:      Appearance: She is well-developed. She is obese.     Comments: Obese WF NAD.  HENT:     Head: Normocephalic and atraumatic.     Right Ear: External ear normal.     Left Ear: External ear  normal.     Nose: Nose normal.  Eyes:     General: No scleral icterus.    Conjunctiva/sclera: Conjunctivae normal.  Neck:     Thyroid: No thyromegaly.  Cardiovascular:     Rate and Rhythm: Normal rate and regular rhythm.     Heart sounds: Normal heart sounds.  Pulmonary:     Effort: Pulmonary effort is normal.     Breath sounds: Normal breath sounds.  Abdominal:     Palpations: Abdomen is soft.  Musculoskeletal:     Comments: Bilateral 1+ pedal and ankle edema  Skin:    General: Skin is warm and dry.  Neurological:     General: No focal deficit present.     Mental Status: She is alert and oriented to person, place, and time.  Psychiatric:        Mood and Affect: Mood normal.        Behavior: Behavior normal.        Thought Content: Thought content normal.        Judgment: Judgment normal.      No results found for any visits on 06/09/19.     Assessment & Plan    1. Essential hypertension Patient relatively hypotensive today.  I would like to stop HCTZ but patient complains of chronic edema.  Consider cutting losartan dose in half.  Stop daily aspirin as patient has no documented CAD or cerebrovascular disease.  I will see her back at the end of the summer. - CBC with Differential/Platelet - Comprehensive metabolic panel - TSH  2. B12 deficiency Continue over-the-counter B12 - CBC with Differential/Platelet - Comprehensive metabolic panel - TSH  3. Vitamin D deficiency Vitamin D 2000 units daily - CBC with Differential/Platelet - Comprehensive metabolic panel - TSH  4. Gastroesophageal reflux disease, unspecified whether esophagitis present Famotidine daily - CBC with Differential/Platelet - Comprehensive metabolic panel - TSH - famotidine (PEPCID) 20 MG tablet; Take 1 tablet (20 mg total) by mouth daily.  Dispense: 30 tablet; Refill: 5  5. Allergic rhinitis, unspecified seasonality, unspecified trigger   6. Primary osteoarthritis involving multiple  joints Limits mobility.  7. Obesity, morbid, BMI 40.0-49.9 ([place]) Patient fall risk due to her obesity and osteoarthritis.  8. History of breast cancer Follow clinically.     Lisa Paul Mon, MD  [facility]

## 2019-06-09 ENCOUNTER — Other Ambulatory Visit: Payer: Self-pay

## 2019-06-09 ENCOUNTER — Ambulatory Visit (INDEPENDENT_AMBULATORY_CARE_PROVIDER_SITE_OTHER): Payer: Medicare PPO | Admitting: Family Medicine

## 2019-06-09 ENCOUNTER — Encounter: Payer: Self-pay | Admitting: Family Medicine

## 2019-06-09 ENCOUNTER — Ambulatory Visit: Payer: Self-pay | Admitting: Family Medicine

## 2019-06-09 VITALS — BP 103/65 | HR 97 | Temp 96.8°F | Wt 254.8 lb

## 2019-06-09 DIAGNOSIS — K219 Gastro-esophageal reflux disease without esophagitis: Secondary | ICD-10-CM

## 2019-06-09 DIAGNOSIS — J309 Allergic rhinitis, unspecified: Secondary | ICD-10-CM

## 2019-06-09 DIAGNOSIS — E559 Vitamin D deficiency, unspecified: Secondary | ICD-10-CM

## 2019-06-09 DIAGNOSIS — I10 Essential (primary) hypertension: Secondary | ICD-10-CM

## 2019-06-09 DIAGNOSIS — Z853 Personal history of malignant neoplasm of breast: Secondary | ICD-10-CM

## 2019-06-09 DIAGNOSIS — E538 Deficiency of other specified B group vitamins: Secondary | ICD-10-CM

## 2019-06-09 DIAGNOSIS — M159 Polyosteoarthritis, unspecified: Secondary | ICD-10-CM

## 2019-06-09 DIAGNOSIS — M8949 Other hypertrophic osteoarthropathy, multiple sites: Secondary | ICD-10-CM

## 2019-06-09 MED ORDER — LOSARTAN POTASSIUM 100 MG PO TABS: 100.0000 mg | ORAL_TABLET | Freq: Every day | ORAL | 5 refills | Status: DC

## 2019-06-09 MED ORDER — FAMOTIDINE 20 MG PO TABS: 20.0000 mg | ORAL_TABLET | Freq: Every day | ORAL | 5 refills | Status: DC

## 2019-06-09 NOTE — Patient Instructions (Signed)
Stop taking aspirin, take otc Vitamin D 2000U daily!!!!!

## 2019-06-10 ENCOUNTER — Telehealth: Payer: Self-pay

## 2019-06-10 LAB — COMPREHENSIVE METABOLIC PANEL
ALT: 16 IU/L (ref 0–32)
AST: 17 IU/L (ref 0–40)
Albumin/Globulin Ratio: 1.4 (ref 1.2–2.2)
Albumin: 4.3 g/dL (ref 3.6–4.6)
Alkaline Phosphatase: 74 IU/L (ref 39–117)
BUN/Creatinine Ratio: 14 (ref 12–28)
BUN: 18 mg/dL (ref 8–27)
Bilirubin Total: 0.4 mg/dL (ref 0.0–1.2)
CO2: 24 mmol/L (ref 20–29)
Calcium: 9.9 mg/dL (ref 8.7–10.3)
Chloride: 103 mmol/L (ref 96–106)
Creatinine, Ser: 1.27 mg/dL — ABNORMAL HIGH (ref 0.57–1.00)
GFR calc Af Amer: 45 mL/min/{1.73_m2} — ABNORMAL LOW (ref >59)
GFR calc non Af Amer: 39 mL/min/{1.73_m2} — ABNORMAL LOW (ref >59)
Globulin, Total: 3.1 g/dL (ref 1.5–4.5)
Glucose: 100 mg/dL — ABNORMAL HIGH (ref 65–99)
Potassium: 4 mmol/L (ref 3.5–5.2)
Sodium: 142 mmol/L (ref 134–144)
Total Protein: 7.4 g/dL (ref 6.0–8.5)

## 2019-06-10 LAB — CBC WITH DIFFERENTIAL/PLATELET
Basophils Absolute: 0.1 10*3/uL (ref 0.0–0.2)
Basos: 1 %
EOS (ABSOLUTE): 0.2 10*3/uL (ref 0.0–0.4)
Eos: 2 %
Hematocrit: 44 % (ref 34.0–46.6)
Hemoglobin: 14.8 g/dL (ref 11.1–15.9)
Immature Grans (Abs): 0 10*3/uL (ref 0.0–0.1)
Immature Granulocytes: 0 %
Lymphocytes Absolute: 2.2 10*3/uL (ref 0.7–3.1)
Lymphs: 24 %
MCH: 30.7 pg (ref 26.6–33.0)
MCHC: 33.6 g/dL (ref 31.5–35.7)
MCV: 91 fL (ref 79–97)
Monocytes Absolute: 0.8 10*3/uL (ref 0.1–0.9)
Monocytes: 9 %
Neutrophils Absolute: 6.1 10*3/uL (ref 1.4–7.0)
Neutrophils: 64 %
Platelets: 331 10*3/uL (ref 150–450)
RBC: 4.82 x10E6/uL (ref 3.77–5.28)
RDW: 12.1 % (ref 11.7–15.4)
WBC: 9.4 10*3/uL (ref 3.4–10.8)

## 2019-06-10 LAB — TSH: TSH: 1.91 u[IU]/mL (ref 0.450–4.500)

## 2019-06-10 NOTE — Telephone Encounter (Signed)
-----   Message from Jerrol Banana., MD sent at 06/10/2019  2:22 PM EST ----- Labs stable.  To protect kidney function avoid anti-inflammatory drugs such as Aleve or Advil.  Encourage water.

## 2019-06-10 NOTE — Telephone Encounter (Signed)
Patient advised.

## 2019-06-26 ENCOUNTER — Other Ambulatory Visit: Payer: Self-pay | Admitting: Family Medicine

## 2019-06-26 DIAGNOSIS — I10 Essential (primary) hypertension: Secondary | ICD-10-CM

## 2019-08-07 NOTE — Progress Notes (Signed)
Subjective:   Lisa Paul is a 85 y.o. female who presents for Medicare Annual (Subsequent) preventive examination.    This visit is being conducted via phone call  - after an attmept to do on video chat - due to the COVID-19 pandemic. This patient has given me verbal consent via phone to conduct this visit, patient states they are participating from their home address. Some vital signs may be absent or patient reported.    Patient identification: identified by name, DOB, and current address.  Review of Systems:  N/A  Cardiac Risk Factors include: advanced age (>73men, >25 women);hypertension;obesity (BMI >30kg/m2)     Objective:     Vitals: There were no vitals taken for this visit.  There is no height or weight on file to calculate BMI. Unable to obtain vitals due to visit being conducted via telephonically.   Advanced Directives 08/11/2019 10/03/2017 09/19/2017 11/09/2016 11/03/2016 05/10/2016  Does Patient Have a Medical Advance Directive? Yes Yes Yes No Yes Yes  Type of Paramedic of [place];Living will Healthcare Power of [place];Living will  Does patient want to make changes to medical advance directive? - No - Patient declined - - - -  Copy of [place] in Chart? Yes - validated most recent copy scanned in chart (See row information) No - copy requested - - - (No Data)    Tobacco Social History   Tobacco Use  Smoking Status Never Smoker  Smokeless Tobacco Never Used     Counseling given: Not Answered   Clinical Intake:  Pre-visit preparation completed: Yes  Pain : No/denies pain Pain Score: 0-No pain     Nutritional Risks: None Diabetes: No  How often do you need to have someone help you when you read instructions, pamphlets, or other written materials from your doctor or pharmacy?: 1 - Never  Interpreter Needed?: No  Information entered by ::  [facility], LPN  Past Medical History:  Diagnosis Date  . Allergy   . Anxiety   . Arthritis 1993  . Cancer [facility]) 2012   Left Breast with lumpectomy and mammosite  . Cataract   . Colon polyp 2005  . Dyspnea   . GERD (gastroesophageal reflux disease)   . Hypertension 15 years  . Incontinence of urine   . Lower extremity edema   . Malignant neoplasm of breast (female), unspecified site    left breast  . Neuropathy   . Personal history of radiation therapy 2012   mammosite  . Sleep apnea    Past Surgical History:  Procedure Laterality Date  . ABDOMINAL HYSTERECTOMY  30 years ago  . BREAST BIOPSY Left 2012   invasive mammary carcinoma  . BREAST LUMPECTOMY Left 2012  . CARPAL TUNNEL RELEASE Bilateral   . COLONOSCOPY  05/2003   Dr Nicolasa Ducking [facility]  . COLONOSCOPY  07/24/12  . EYE SURGERY Bilateral 2005   cataract  . JOINT REPLACEMENT Bilateral 2007   knees  . mammosite balloon placement Left 08/2010   Removal 09/2010  . TONSILLECTOMY  age 47  . TOTAL HIP ARTHROPLASTY Right 10/03/2017   Procedure: TOTAL HIP ARTHROPLASTY ANTERIOR APPROACH;  Surgeon: Lovell Sheehan, MD;  Location: [facility] ORS;  Service: Orthopedics;  Laterality: Right;   Family History  Problem Relation Age of Onset  . Colon cancer Mother   . Stroke Brother        possibly. Pt is unsure.  Marland Kitchen  Breast cancer Maternal Aunt   . Prostate cancer Neg Hx   . Bladder Cancer Neg Hx   . Kidney cancer Neg Hx    Social History   Socioeconomic History  . Marital status: Widowed    Spouse name: Not on file  . Number of children: 2  . Years of education: Not on file  . Highest education level: Master's degree (e.g., MA, MS, MEng, MEd, MSW, MBA)  Occupational History  . Occupation: retired  Tobacco Use  . Smoking status: Never Smoker  . Smokeless tobacco: Never Used  Substance and Sexual Activity  . Alcohol use: No  . Drug use: No  . Sexual activity: Never  Other Topics Concern  . Not on file  Social History Narrative   . Not on file   Social Determinants of Health   Financial Resource Strain: Low Risk   . Difficulty of Paying Living Expenses: Not hard at all  Food Insecurity: No Food Insecurity  . Worried About Charity fundraiser in the Last Year: Never true  . Ran Out of Food in the Last Year: Never true  Transportation Needs: No Transportation Needs  . Lack of Transportation (Medical): No  . Lack of Transportation (Non-Medical): No  Physical Activity: Inactive  . Days of Exercise per Week: 0 days  . Minutes of Exercise per Session: 0 min  Stress: Stress Concern Present  . Feeling of Stress : To some extent  Social Connections: Moderately Isolated  . Frequency of Communication with Friends and Family: More than three times a week  . Frequency of Social Gatherings with Friends and Family: More than three times a week  . Attends Religious Services: Never  . Active Member of Clubs or Organizations: No  . Attends Archivist Meetings: Never  . Marital Status: Widowed    Outpatient Encounter Medications as of 08/11/2019  Medication Sig  . acetaminophen (TYLENOL) 500 MG tablet Take 2 tablets (1,000 mg total) by mouth every 8 (eight) hours as needed. (Patient taking differently: Take 500 mg by mouth every 8 (eight) hours as needed (for pain. (with tramadol)). )  . amoxicillin (AMOXIL) 500 MG tablet Take 500 mg by mouth as directed. 4 tablets prior to dental procedures  . Cyanocobalamin (B-12) 1000 MCG TABS Take 1 tablet by mouth daily. (Patient taking differently: Take 1,000 mcg by mouth daily. )  . diphenhydrAMINE (BENADRYL) 25 mg capsule Take 25 mg by mouth every 6 (six) hours as needed. Equate brand - taking @@ night for runny eyes  . famotidine (PEPCID) 20 MG tablet Take 1 tablet (20 mg total) by mouth daily.  . fluticasone (FLONASE) 50 MCG/ACT nasal spray SPRAY 2 SPRAYS INTO EACH NOSTRIL EVERY DAY  . guaiFENesin (MUCINEX) 600 MG 12 hr tablet Take 600 mg by mouth 2 (two) times daily. As  needed for runny eyes  . hydrochlorothiazide (HYDRODIURIL) 25 MG tablet TAKE 1 TABLET BY MOUTH EVERY DAY  . losartan (COZAAR) 100 MG tablet Take 1 tablet (100 mg total) by mouth daily.  . mirabegron ER (MYRBETRIQ) 50 MG TB24 tablet Take 1 tablet (50 mg total) by mouth daily.  . Multiple Vitamin (MULTIVITAMIN WITH MINERALS) TABS tablet Take 1 tablet by mouth daily. Women's Daily Multivitamin  . Polyethyl Glycol-Propyl Glycol (LUBRICANT EYE DROPS) 0.4-0.3 % SOLN Place 1 drop into both eyes 2 (two) times daily as needed (for dry eyes.).  Marland Kitchen losartan-hydrochlorothiazide (HYZAAR) 100-25 MG tablet losartan 100 mg-hydrochlorothiazide 25 mg tablet  TAKE 1 TABLET  BY MOUTH EVERY DAY  . ranitidine (ZANTAC) 150 MG tablet TAKE 1 TABLET BY MOUTH IN THE MORNING. MAY REPEAT DOSE IN THE EVENING IF NEEDED FOR ACID REFLUX OR INDIGESTION. (Patient not taking: Reported on 06/09/2019)   No facility-administered encounter medications on file as of 08/11/2019.    Activities of Daily Living In your present state of health, do you have any difficulty performing the following activities: 08/11/2019  Hearing? Y  Comment Wears bilateral hearing aids.  Vision? N  Difficulty concentrating or making decisions? N  Walking or climbing stairs? Y  Comment Due to getting tired when climbing stairs.  Dressing or bathing? N  Doing errands, shopping? Y  Comment Not driving currently.  Preparing Food and eating ? N  Using the Toilet? N  In the past six months, have you accidently leaked urine? Y  Comment Currently on Myrbetriq and wears a pad at all times.  Do you have problems with loss of bowel control? N  Managing your Medications? N  Managing your Finances? N  Housekeeping or managing your Housekeeping? N  Some recent data might be hidden    Patient Care Team: Jerrol Banana., MD as PCP - General (Unknown Physician Specialty) Estill Cotta, MD as Consulting Physician (Ophthalmology) Clyde Canterbury, MD as  Referring Physician (Otolaryngology) Ralene Bathe, MD as Consulting Physician (Dermatology)    Assessment:   This is a routine wellness examination for [place].  Exercise Activities and Dietary recommendations Current Exercise Habits: The patient does not participate in regular exercise at present, Exercise limited by: orthopedic condition(s)  Goals    . Exercise      Starting in spring 2018, I will start exercising at least 3 days a week.       Fall Risk: Fall Risk  08/11/2019 03/11/2019 05/10/2016 10/14/2015 05/10/2015  Falls in the past year? 0 0 No No No  Number falls in past yr: 0 0 - - -  Injury with Fall? 0 0 - - -    FALL RISK PREVENTION PERTAINING TO THE HOME:  Any stairs in or around the home? No  If so, are there any without handrails? N/A  Home free of loose throw rugs in walkways, pet beds, electrical cords, etc? Yes  Adequate lighting in your home to reduce risk of falls? Yes   ASSISTIVE DEVICES UTILIZED TO PREVENT FALLS:  Life alert? No  Use of a cane, walker or w/c? Yes  Grab bars in the bathroom? No  Shower chair or bench in shower? No  Elevated toilet seat or a handicapped toilet? Yes    TIMED UP AND GO:  Was the test performed? No .    Depression Screen PHQ 2/9 Scores 08/11/2019 06/09/2019 07/30/2017 03/28/2017  PHQ - 2 Score 0 3 1 2   PHQ- 9 Score - 8 5 8      Cognitive Function: Declined today.  MMSE - Mini Mental State Exam 07/30/2017  Orientation to time 5  Orientation to Place 5  Registration 3  Attention/ Calculation 5  Recall 2  Language- name 2 objects 2  Language- repeat 1  Language- follow 3 step command 3  Language- read & follow direction 1  Write a sentence 1  Copy design 0  Total score 28     6CIT Screen 05/10/2016  What Year? 0 points  What month? 0 points  What time? 0 points  Count back from 20 0 points  Months in reverse 0 points  Repeat phrase 6 points  Total Score 6    Immunization History  Administered Date(s)  Administered  . Fluad Quad(high Dose 65+) 01/14/2019  . Influenza, High Dose Seasonal PF 02/18/2015, 04/28/2016, 01/24/2017, 01/23/2018  . Influenza-Unspecified 01/01/2013  . PFIZER SARS-COV-2 Vaccination 05/15/2019  . Pneumococcal Conjugate-13 05/07/2014  . Pneumococcal Polysaccharide-23 02/15/1998, 07/11/2011  . Td 03/12/2003  . Tdap 05/07/2014  . Zoster 06/08/2007    Qualifies for Shingles Vaccine? Yes  Zostavax completed 06/08/07. Due for Shingrix. Pt has been advised to call insurance company to determine out of pocket expense. Advised may also receive vaccine at local pharmacy or Health Dept. Verbalized acceptance and understanding.  Tdap: Up to date  Flu Vaccine: Up to date  Pneumococcal Vaccine: Completed series  Screening Tests Health Maintenance  Topic Date Due  . COVID-19 Vaccine (2 - Pfizer 2-dose series) 06/05/2019  . INFLUENZA VACCINE  11/02/2019  . TETANUS/TDAP  05/07/2024  . DEXA SCAN  Completed  . PNA vac Low Risk Adult  Completed    Cancer Screenings:  Colorectal Screening: No longer required.   Mammogram: No longer required.   Bone Density: Completed 07/01/14. Previous DEXA scan was normal. No repeat needed unless advised by a physician.  Lung Cancer Screening: (Low Dose CT Chest recommended if Age 64-80 years, 30 pack-year currently smoking OR have quit w/in 15years.) does not qualify.   Additional Screening:  Vision Screening: Recommended annual ophthalmology exams for early detection of glaucoma and other disorders of the eye.  Dental Screening: Recommended annual dental exams for proper oral hygiene  Community Resource Referral:  CRR required this visit?  No       Plan:  I have personally reviewed and addressed the Medicare Annual Wellness questionnaire and have noted the following in the patient's chart:  A. Medical and social history B. Use of alcohol, tobacco or illicit drugs  C. Current medications and supplements D. Functional ability  and status E.  Nutritional status F.  Physical activity G. Advance directives H. List of other physicians I.  Hospitalizations, surgeries, and ER visits in previous 12 months J.  Many such as hearing and vision if needed, cognitive and depression L. Referrals and appointments   In addition, I have reviewed and discussed with patient certain preventive protocols, quality metrics, and best practice recommendations. A written personalized care plan for preventive services as well as general preventive health recommendations were provided to patient. Nurse Health Advisor  Signed,    Opha Mcghee [place], [place]  [id number] Nurse Health Advisor   Nurse Notes: None.

## 2019-08-11 ENCOUNTER — Ambulatory Visit (INDEPENDENT_AMBULATORY_CARE_PROVIDER_SITE_OTHER): Payer: Medicare PPO

## 2019-08-11 ENCOUNTER — Other Ambulatory Visit: Payer: Self-pay

## 2019-08-11 DIAGNOSIS — Z0000 Encounter for general adult medical examination without abnormal findings: Secondary | ICD-10-CM

## 2019-08-11 NOTE — Patient Instructions (Signed)
Lisa Paul , Thank you for taking time to come for your Medicare Wellness Visit. I appreciate your ongoing commitment to your health goals. Please review the following plan we discussed and let me know if I can assist you in the future.   Screening recommendations/referrals: Colonoscopy: No longer required.  Mammogram: No longer required.  Bone Density: Up to date. Previous DEXA scan was normal. No repeat needed unless advised by a physician. Recommended yearly ophthalmology/optometry visit for glaucoma screening and checkup Recommended yearly dental visit for hygiene and checkup  Vaccinations: Influenza vaccine: Up to date Pneumococcal vaccine: Completed series Tdap vaccine: Up to date Shingles vaccine: Pt declines today.     Advanced directives: Currently on file.  Conditions/risks identified: Recommend to start walking 3 days a week for at least 30 minutes at a time.  Next appointment: 12/11/19 @ 9:00 AM with Dr Rosanna Randy. Declined scheduling an AWV for 2022 at this time.    Preventive Care 83 Years and Older, Female Preventive care refers to lifestyle choices and visits with your health care provider that can promote health and wellness. What does preventive care include?  A yearly physical exam. This is also called an annual well check.  Dental exams once or twice a year.  Routine eye exams. Ask your health care provider how often you should have your eyes checked.  Personal lifestyle choices, including:  Daily care of your teeth and gums.  Regular physical activity.  Eating a healthy diet.  Avoiding tobacco and drug use.  Limiting alcohol use.  Practicing safe sex.  Taking low-dose aspirin every day.  Taking vitamin and mineral supplements as recommended by your health care provider. What happens during an annual well check? The services and screenings done by your health care provider during your annual well check will depend on your age, overall health, lifestyle  risk factors, and family history of disease. Counseling  Your health care provider may ask you questions about your:  Alcohol use.  Tobacco use.  Drug use.  Emotional well-being.  Home and relationship well-being.  Sexual activity.  Eating habits.  History of falls.  Memory and ability to understand (cognition).  Work and work Statistician.  Reproductive health. Screening  You may have the following tests or measurements:  Height, weight, and BMI.  Blood pressure.  Lipid and cholesterol levels. These may be checked every 5 years, or more frequently if you are over 83 years old.  Skin check.  Lung cancer screening. You may have this screening every year starting at age 83 if you have a 30-pack-year history of smoking and currently smoke or have quit within the past 15 years.  Fecal occult blood test (FOBT) of the stool. You may have this test every year starting at age 83.  Flexible sigmoidoscopy or colonoscopy. You may have a sigmoidoscopy every 5 years or a colonoscopy every 10 years starting at age 83.  Hepatitis C blood test.  Hepatitis B blood test.  Sexually transmitted disease (STD) testing.  Diabetes screening. This is done by checking your blood sugar (glucose) after you have not eaten for a while (fasting). You may have this done every 1-3 years.  Bone density scan. This is done to screen for osteoporosis. You may have this done starting at age 83.  Mammogram. This may be done every 1-2 years. Talk to your health care provider about how often you should have regular mammograms. Talk with your health care provider about your test results, treatment options, and  if necessary, the need for more tests. Vaccines  Your health care provider may recommend certain vaccines, such as:  Influenza vaccine. This is recommended every year.  Tetanus, diphtheria, and acellular pertussis (Tdap, Td) vaccine. You may need a Td booster every 10 years.  Zoster vaccine.  You may need this after age 83.  Pneumococcal 13-valent conjugate (PCV13) vaccine. One dose is recommended after age 83.  Pneumococcal polysaccharide (PPSV23) vaccine. One dose is recommended after age 83. Talk to your health care provider about which screenings and vaccines you need and how often you need them. This information is not intended to replace advice given to you by your health care provider. Make sure you discuss any questions you have with your health care provider. Document Released: 04/16/2015 Document Revised: 12/08/2015 Document Reviewed: 01/19/2015 Elsevier Interactive Patient Education  2017 [employer] Prevention in the Home Falls can cause injuries. They can happen to people of all ages. There are many things you can do to make your home safe and to help prevent falls. What can I do on the outside of my home?  Regularly fix the edges of walkways and driveways and fix any cracks.  Remove anything that might make you trip as you walk through a door, such as a raised step or threshold.  Trim any bushes or trees on the path to your home.  Use bright outdoor lighting.  Clear any walking paths of anything that might make someone trip, such as rocks or tools.  Regularly check to see if handrails are loose or broken. Make sure that both sides of any steps have handrails.  Any raised decks and porches should have guardrails on the edges.  Have any leaves, snow, or ice cleared regularly.  Use sand or salt on walking paths during winter.  Clean up any spills in your garage right away. This includes oil or grease spills. What can I do in the bathroom?  Use night lights.  Install grab bars by the toilet and in the tub and shower. Do not use towel bars as grab bars.  Use non-skid mats or decals in the tub or shower.  If you need to sit down in the shower, use a plastic, non-slip stool.  Keep the floor dry. Clean up any water that spills on the floor as soon  as it happens.  Remove soap buildup in the tub or shower regularly.  Attach bath mats securely with double-sided non-slip rug tape.  Do not have throw rugs and other things on the floor that can make you trip. What can I do in the bedroom?  Use night lights.  Make sure that you have a light by your bed that is easy to reach.  Do not use any sheets or blankets that are too big for your bed. They should not hang down onto the floor.  Have a firm chair that has side arms. You can use this for support while you get dressed.  Do not have throw rugs and other things on the floor that can make you trip. What can I do in the kitchen?  Clean up any spills right away.  Avoid walking on wet floors.  Keep items that you use a lot in easy-to-reach places.  If you need to reach something above you, use a strong step stool that has a grab bar.  Keep electrical cords out of the way.  Do not use floor polish or wax that makes floors slippery. If you  must use wax, use non-skid floor wax.  Do not have throw rugs and other things on the floor that can make you trip. What can I do with my stairs?  Do not leave any items on the stairs.  Make sure that there are handrails on both sides of the stairs and use them. Fix handrails that are broken or loose. Make sure that handrails are as long as the stairways.  Check any carpeting to make sure that it is firmly attached to the stairs. Fix any carpet that is loose or worn.  Avoid having throw rugs at the top or bottom of the stairs. If you do have throw rugs, attach them to the floor with carpet tape.  Make sure that you have a light switch at the top of the stairs and the bottom of the stairs. If you do not have them, ask someone to add them for you. What else can I do to help prevent falls?  Wear shoes that:  Do not have high heels.  Have rubber bottoms.  Are comfortable and fit you well.  Are closed at the toe. Do not wear sandals.  If  you use a stepladder:  Make sure that it is fully opened. Do not climb a closed stepladder.  Make sure that both sides of the stepladder are locked into place.  Ask someone to hold it for you, if possible.  Clearly mark and make sure that you can see:  Any grab bars or handrails.  First and last steps.  Where the edge of each step is.  Use tools that help you move around (mobility aids) if they are needed. These include:  Canes.  Walkers.  Scooters.  Crutches.  Turn on the lights when you go into a dark area. Replace any light bulbs as soon as they burn out.  Set up your furniture so you have a clear path. Avoid moving your furniture around.  If any of your floors are uneven, fix them.  If there are any pets around you, be aware of where they are.  Review your medicines with your doctor. Some medicines can make you feel dizzy. This can increase your chance of falling. Ask your doctor what other things that you can do to help prevent falls. This information is not intended to replace advice given to you by your health care provider. Make sure you discuss any questions you have with your health care provider. Document Released: 01/14/2009 Document Revised: 08/26/2015 Document Reviewed: 04/24/2014 Elsevier Interactive Patient Education  2017 [employer].

## 2019-11-24 ENCOUNTER — Other Ambulatory Visit: Payer: Self-pay | Admitting: Family Medicine

## 2019-11-24 NOTE — Telephone Encounter (Signed)
Medication Refill - Medication: mybetriq   Has the patient contacted their pharmacy? Yes.   (Agent: If no, request that the patient contact the pharmacy for the refill.) (Agent: If yes, when and what did the pharmacy advise?)  Preferred Pharmacy (with phone number or street name):  [id number] [place] [place] - [address]  [place] [address]  Phone: [phone] Fax: [phone]  Hours: Not open 24 hours     Agent: Please be advised that RX refills may take up to 3 business days. We ask that you follow-up with your pharmacy.

## 2019-11-25 MED ORDER — MIRABEGRON ER 50 MG PO TB24: 50.0000 mg | ORAL_TABLET | Freq: Every day | ORAL | 3 refills | Status: DC

## 2019-12-10 NOTE — Progress Notes (Signed)
I,Lisa Paul,acting as a scribe for Lisa Durie, MD.,have documented all relevant documentation on the behalf of Lisa Durie, MD,as directed by  Lisa Durie, MD while in the presence of Lisa Durie, MD.   Complete physical exam   Patient: Lisa Paul   DOB: 30-Nov-1934   85 y.o. Female  MRN: [id number] Visit Date: 12/11/2019  Today's healthcare provider: Wilhemena Durie, MD   Chief Complaint  Patient presents with  . Annual Exam   Subjective    Lisa Paul is a 85 y.o. female who presents today for a complete physical exam.  She reports consuming a general diet. The patient does not participate in regular exercise at present. She generally feels fairly well. She reports sleeping fairly well. She does not have additional problems to discuss today.  She is moving to [place] with her son. HPI  Patient has a complaint of chronic arthritis.  Particularly lately she has had right shoulder pain is basically an impingement syndrome. She also complains of recent chronic left and high irritation.  It feels gritty and occasionally is matted a little bit in the morning. Patient had AWV with [facility] on 08/11/2019.  Past Medical History:  Diagnosis Date  . Allergy   . Anxiety   . Arthritis 1993  . Cancer [facility]) 2012   Left Breast with lumpectomy and mammosite  . Cataract   . Colon polyp 2005  . Dyspnea   . GERD (gastroesophageal reflux disease)   . Hypertension 15 years  . Incontinence of urine   . Lower extremity edema   . Malignant neoplasm of breast (female), unspecified site    left breast  . Neuropathy   . Personal history of radiation therapy 2012   mammosite  . Sleep apnea    Past Surgical History:  Procedure Laterality Date  . ABDOMINAL HYSTERECTOMY  30 years ago  . BREAST BIOPSY Left 2012   invasive mammary carcinoma  . BREAST LUMPECTOMY Left 2012  . CARPAL TUNNEL RELEASE Bilateral   . COLONOSCOPY  05/2003   Dr Nicolasa Ducking [facility]  .  COLONOSCOPY  07/24/12  . EYE SURGERY Bilateral 2005   cataract  . JOINT REPLACEMENT Bilateral 2007   knees  . mammosite balloon placement Left 08/2010   Removal 09/2010  . TONSILLECTOMY  age 43  . TOTAL HIP ARTHROPLASTY Right 10/03/2017   Procedure: TOTAL HIP ARTHROPLASTY ANTERIOR APPROACH;  Surgeon: Lovell Sheehan, MD;  Location: [facility] ORS;  Service: Orthopedics;  Laterality: Right;   Social History   Socioeconomic History  . Marital status: Widowed    Spouse name: Not on file  . Number of children: 2  . Years of education: Not on file  . Highest education level: Master's degree (e.g., MA, MS, MEng, MEd, MSW, MBA)  Occupational History  . Occupation: retired  Tobacco Use  . Smoking status: Never Smoker  . Smokeless tobacco: Never Used  Vaping Use  . Vaping Use: Never used  Substance and Sexual Activity  . Alcohol use: No  . Drug use: No  . Sexual activity: Never  Other Topics Concern  . Not on file  Social History Narrative  . Not on file   Social Determinants of Health   Financial Resource Strain: Low Risk   . Difficulty of Paying Living Expenses: Not hard at all  Food Insecurity: No Food Insecurity  . Worried About Charity fundraiser in the Last Year: Never true  . Ran Out  of Food in the Last Year: Never true  Transportation Needs: No Transportation Needs  . Lack of Transportation (Medical): No  . Lack of Transportation (Non-Medical): No  Physical Activity: Inactive  . Days of Exercise per Week: 0 days  . Minutes of Exercise per Session: 0 min  Stress: Stress Concern Present  . Feeling of Stress : To some extent  Social Connections: Socially Isolated  . Frequency of Communication with Friends and Family: More than three times a week  . Frequency of Social Gatherings with Friends and Family: More than three times a week  . Attends Religious Services: Never  . Active Member of Clubs or Organizations: No  . Attends Archivist Meetings: Never  . Marital  Status: Widowed  Intimate Partner Violence: Not At Risk  . Fear of Current or Ex-Partner: No  . Emotionally Abused: No  . Physically Abused: No  . Sexually Abused: No   Family Status  Relation Name Status  . Mother  Deceased  . Father  Deceased       unknown causes.  . Brother  Alive  . Brother  Deceased  . Other Husband Deceased       paraplegic after stretching esophagus  . Mat Aunt  (Not Specified)  . Neg Hx  (Not Specified)   Family History  Problem Relation Age of Onset  . Colon cancer Mother   . Stroke Brother        possibly. Pt is unsure.  . Breast cancer Maternal Aunt   . Prostate cancer Neg Hx   . Bladder Cancer Neg Hx   . Kidney cancer Neg Hx    Allergies  Allergen Reactions  . Tape     Paper tape - blisters    Patient Care Team: Jerrol Banana., MD as PCP - General (Unknown Physician Specialty) Estill Cotta, MD as Consulting Physician (Ophthalmology) [place] Canterbury, MD as Referring Physician (Otolaryngology) Ralene Bathe, MD as Consulting Physician (Dermatology)   Medications: Outpatient Medications Prior to Visit  Medication Sig  . acetaminophen (TYLENOL) 500 MG tablet Take 2 tablets (1,000 mg total) by mouth every 8 (eight) hours as needed. (Patient taking differently: Take 500 mg by mouth every 8 (eight) hours as needed (for pain. (with tramadol)). )  . amoxicillin (AMOXIL) 500 MG tablet Take 500 mg by mouth as directed. 4 tablets prior to dental procedures  . Cyanocobalamin (B-12) 1000 MCG TABS Take 1 tablet by mouth daily. (Patient taking differently: Take 1,000 mcg by mouth daily. )  . diphenhydrAMINE (BENADRYL) 25 mg capsule Take 25 mg by mouth every 6 (six) hours as needed. Equate brand - taking @@ night for runny eyes  . guaiFENesin (MUCINEX) 600 MG 12 hr tablet Take 600 mg by mouth 2 (two) times daily. As needed for runny eyes  . hydrochlorothiazide (HYDRODIURIL) 25 MG tablet TAKE 1 TABLET BY MOUTH EVERY DAY  . Multiple Vitamin  (MULTIVITAMIN WITH MINERALS) TABS tablet Take 1 tablet by mouth daily. Women's Daily Multivitamin  . Polyethyl Glycol-Propyl Glycol (LUBRICANT EYE DROPS) 0.4-0.3 % SOLN Place 1 drop into both eyes 2 (two) times daily as needed (for dry eyes.).  . [DISCONTINUED] famotidine (PEPCID) 20 MG tablet Take 1 tablet (20 mg total) by mouth daily.  . [DISCONTINUED] fluticasone (FLONASE) 50 MCG/ACT nasal spray SPRAY 2 SPRAYS INTO EACH NOSTRIL EVERY DAY  . [DISCONTINUED] losartan (COZAAR) 100 MG tablet Take 1 tablet (100 mg total) by mouth daily.  . [DISCONTINUED] losartan-hydrochlorothiazide (HYZAAR) 100-25  MG tablet losartan 100 mg-hydrochlorothiazide 25 mg tablet  TAKE 1 TABLET BY MOUTH EVERY DAY  . [DISCONTINUED] mirabegron ER (MYRBETRIQ) 50 MG TB24 tablet Take 1 tablet (50 mg total) by mouth daily.  . ranitidine (ZANTAC) 150 MG tablet TAKE 1 TABLET BY MOUTH IN THE MORNING. MAY REPEAT DOSE IN THE EVENING IF NEEDED FOR ACID REFLUX OR INDIGESTION. (Patient not taking: Reported on 12/11/2019)   No facility-administered medications prior to visit.    Review of Systems  Eyes: Positive for discharge.  Genitourinary: Positive for enuresis.  All other systems reviewed and are negative.     Objective    BP 135/77 (BP Location: Left Arm, Patient Position: Sitting, Cuff Size: Large)   Pulse 96   Temp 98.2 F (36.8 C) (Oral)   Resp 18   Ht 5\' 6"  (1.676 m)   Wt 258 lb (117 kg)   SpO2 97%   BMI 41.64 kg/m    Physical Exam Vitals reviewed.  Constitutional:      Appearance: She is well-developed.     Comments: Obese WF NAD.  HENT:     Head: Normocephalic and atraumatic.     Right Ear: External ear normal.     Left Ear: External ear normal.     Nose: Nose normal.  Eyes:     General: No scleral icterus.       Right eye: No discharge.        Left eye: No discharge.     Extraocular Movements: Extraocular movements intact.     Conjunctiva/sclera: Conjunctivae normal.  Neck:     Thyroid: No  thyromegaly.  Cardiovascular:     Rate and Rhythm: Normal rate and regular rhythm.     Heart sounds: Normal heart sounds.  Pulmonary:     Effort: Pulmonary effort is normal.     Breath sounds: Normal breath sounds.  Abdominal:     Palpations: Abdomen is soft.  Skin:    General: Skin is warm and dry.     Comments: Small appearing nevus in the left pinna  Neurological:     Mental Status: She is alert and oriented to person, place, and time.  Psychiatric:        Behavior: Behavior normal.        Thought Content: Thought content normal.        Judgment: Judgment normal.     MMSE - Mini Mental State Exam 12/11/2019 07/30/2017  Orientation to time 4 5  Orientation to Place 5 5  Registration 3 3  Attention/ Calculation 5 5  Recall 3 2  Language- name 2 objects 2 2  Language- repeat 1 1  Language- follow 3 step command 3 3  Language- read & follow direction 1 1  Write a sentence 1 1  Copy design 1 0  Total score 29 28     Last depression screening scores PHQ 2/9 Scores 12/11/2019 08/11/2019 06/09/2019  PHQ - 2 Score 2 0 3  PHQ- 9 Score 3 - 8   Last fall risk screening Fall Risk  08/11/2019  Falls in the past year? 0  Number falls in past yr: 0  Injury with Fall? 0   Last Audit-C alcohol use screening Alcohol Use Disorder Test (AUDIT) 08/11/2019  1. How often do you have a drink containing alcohol? 0  2. How many drinks containing alcohol do you have on a typical day when you are drinking? 0  3. How often do you have six or more drinks  on one occasion? 0  AUDIT-C Score 0  Alcohol Brief Interventions/Follow-up AUDIT Score <7 follow-up not indicated   A score of 3 or more in women, and 4 or more in men indicates increased risk for alcohol abuse, EXCEPT if all of the points are from question 1   No results found for any visits on 12/11/19.  Assessment & Plan    Routine Health Maintenance and Physical Exam  Exercise Activities and Dietary recommendations Goals    . Exercise       Starting in spring 2018, I will start exercising at least 3 days a week.       Immunization History  Administered Date(s) Administered  . Fluad Quad(high Dose 65+) 01/14/2019, 12/11/2019  . Influenza, High Dose Seasonal PF 02/18/2015, 04/28/2016, 01/24/2017, 01/23/2018  . Influenza-Unspecified 01/01/2013  . PFIZER SARS-COV-2 Vaccination 05/15/2019  . Pneumococcal Conjugate-13 05/07/2014  . Pneumococcal Polysaccharide-23 02/15/1998, 07/11/2011  . Td 03/12/2003  . Tdap 05/07/2014  . Zoster 06/08/2007    Health Maintenance  Topic Date Due  . COVID-19 Vaccine (2 - Pfizer 2-dose series) 06/05/2019  . TETANUS/TDAP  05/07/2024  . INFLUENZA VACCINE  Completed  . DEXA SCAN  Completed  . PNA vac Low Risk Adult  Completed    Discussed health benefits of physical activity, and encouraged her to engage in regular exercise appropriate for her age and condition.  1. Annual physical exam Patient and son advised for her to get a primary care physician in [place]. - Renal function panel - Lipid panel  2. Essential hypertension  - losartan-hydrochlorothiazide (HYZAAR) 100-25 MG tablet; Take 1 tablet by mouth daily.  Dispense: 90 tablet; Refill: 3 - Renal function panel - Lipid panel  3. Allergic rhinitis, unspecified seasonality, unspecified trigger  - fluticasone (FLONASE) 50 MCG/ACT nasal spray; SPRAY 2 SPRAYS INTO EACH NOSTRIL EVERY DAY  Dispense: 16 mL; Refill: 11  4. Urge incontinence  - mirabegron ER (MYRBETRIQ) 50 MG TB24 tablet; Take 1 tablet (50 mg total) by mouth daily.  Dispense: 90 tablet; Refill: 3 - Renal function panel  5. Need for influenza vaccination - Flu Vaccine QUAD High Dose(Fluad)  6. Gastroesophageal reflux disease, unspecified whether esophagitis present  - famotidine (PEPCID) 20 MG tablet; Take 1 tablet (20 mg total) by mouth every morning.  Dispense: 30 tablet; Refill: 5  7. Hyperlipidemia, unspecified hyperlipidemia type  - Renal function  panel - Lipid panel  8. Conjunctivitis of both eyes, unspecified conjunctivitis type  - erythromycin ophthalmic ointment; Place 1 application into both eyes 2 (two) times daily.  Dispense: 3.5 g; Refill: 0 9.Morbid Obesity  10.Left shoulder impingement  11. Nevus left pinna Refer to dermatology. No follow-ups on file.     I, Lisa Durie, MD, have reviewed all documentation for this visit. The documentation on 12/20/19 for the exam, diagnosis, procedures, and orders are all accurate and complete.    Sabriya Yono Cranford Mon, MD  [facility] [phone] (phone) [phone] (fax)  [facility]

## 2019-12-11 ENCOUNTER — Encounter: Payer: Self-pay | Admitting: Family Medicine

## 2019-12-11 ENCOUNTER — Other Ambulatory Visit: Payer: Self-pay

## 2019-12-11 ENCOUNTER — Ambulatory Visit (INDEPENDENT_AMBULATORY_CARE_PROVIDER_SITE_OTHER): Payer: Medicare PPO | Admitting: Family Medicine

## 2019-12-11 VITALS — BP 135/77 | HR 96 | Temp 98.2°F | Resp 18 | Ht 66.0 in | Wt 258.0 lb

## 2019-12-11 DIAGNOSIS — N3941 Urge incontinence: Secondary | ICD-10-CM

## 2019-12-11 DIAGNOSIS — I10 Essential (primary) hypertension: Secondary | ICD-10-CM

## 2019-12-11 DIAGNOSIS — G8929 Other chronic pain: Secondary | ICD-10-CM

## 2019-12-11 DIAGNOSIS — Z0000 Encounter for general adult medical examination without abnormal findings: Secondary | ICD-10-CM

## 2019-12-11 DIAGNOSIS — J309 Allergic rhinitis, unspecified: Secondary | ICD-10-CM | POA: Diagnosis not present

## 2019-12-11 DIAGNOSIS — Z23 Encounter for immunization: Secondary | ICD-10-CM

## 2019-12-11 DIAGNOSIS — K219 Gastro-esophageal reflux disease without esophagitis: Secondary | ICD-10-CM

## 2019-12-11 DIAGNOSIS — M25512 Pain in left shoulder: Secondary | ICD-10-CM

## 2019-12-11 DIAGNOSIS — H109 Unspecified conjunctivitis: Secondary | ICD-10-CM

## 2019-12-11 DIAGNOSIS — E785 Hyperlipidemia, unspecified: Secondary | ICD-10-CM

## 2019-12-11 MED ORDER — MIRABEGRON ER 50 MG PO TB24: 50.0000 mg | ORAL_TABLET | Freq: Every day | ORAL | 3 refills | Status: DC

## 2019-12-11 MED ORDER — LOSARTAN POTASSIUM-HCTZ 100-25 MG PO TABS: ORAL_TABLET | ORAL | 3 refills | Status: DC

## 2019-12-11 MED ORDER — LOSARTAN POTASSIUM-HCTZ 100-25 MG PO TABS: 1.0000 | ORAL_TABLET | Freq: Every day | ORAL | 3 refills | Status: DC

## 2019-12-11 MED ORDER — ERYTHROMYCIN 5 MG/GM OP OINT: 1.0000 "application " | TOPICAL_OINTMENT | Freq: Two times a day (BID) | OPHTHALMIC | 0 refills | Status: DC

## 2019-12-11 MED ORDER — FLUTICASONE PROPIONATE 50 MCG/ACT NA SUSP: NASAL | 11 refills | Status: DC

## 2019-12-11 MED ORDER — FAMOTIDINE 20 MG PO TABS: 20.0000 mg | ORAL_TABLET | ORAL | 5 refills | Status: DC

## 2019-12-11 NOTE — Patient Instructions (Addendum)
Try compression hose to help with swelling. Due for mammogram 03/02/2020. Try Glucosamine or Turmeric daily.

## 2019-12-12 LAB — RENAL FUNCTION PANEL
Albumin: 4.1 g/dL (ref 3.6–4.6)
BUN/Creatinine Ratio: 20 (ref 12–28)
BUN: 21 mg/dL (ref 8–27)
CO2: 24 mmol/L (ref 20–29)
Calcium: 9.6 mg/dL (ref 8.7–10.3)
Chloride: 101 mmol/L (ref 96–106)
Creatinine, Ser: 1.07 mg/dL — ABNORMAL HIGH (ref 0.57–1.00)
GFR calc Af Amer: 55 mL/min/{1.73_m2} — ABNORMAL LOW (ref >59)
GFR calc non Af Amer: 48 mL/min/{1.73_m2} — ABNORMAL LOW (ref >59)
Glucose: 98 mg/dL (ref 65–99)
Phosphorus: 3.3 mg/dL (ref 3.0–4.3)
Potassium: 3.7 mmol/L (ref 3.5–5.2)
Sodium: 139 mmol/L (ref 134–144)

## 2019-12-12 LAB — LIPID PANEL
Chol/HDL Ratio: 4.3 ratio (ref 0.0–4.4)
Cholesterol, Total: 206 mg/dL — ABNORMAL HIGH (ref 100–199)
HDL: 48 mg/dL (ref >39)
LDL Chol Calc (NIH): 137 mg/dL — ABNORMAL HIGH (ref 0–99)
Triglycerides: 116 mg/dL (ref 0–149)
VLDL Cholesterol Cal: 21 mg/dL (ref 5–40)

## 2019-12-16 ENCOUNTER — Telehealth: Payer: Self-pay

## 2019-12-16 NOTE — Telephone Encounter (Signed)
Called to advise patient as below. No answer, LVMTCB. If patient calls back it is okay for someone else to advise.

## 2019-12-16 NOTE — Telephone Encounter (Signed)
-----   Message from Jerrol Banana., MD sent at 12/12/2019 11:27 AM EDT ----- Labs good.

## 2019-12-22 NOTE — Telephone Encounter (Signed)
Pt. Given labs, verbalizes understanding.

## 2020-02-23 ENCOUNTER — Other Ambulatory Visit: Payer: Self-pay | Admitting: Family Medicine

## 2020-02-23 DIAGNOSIS — Z1231 Encounter for screening mammogram for malignant neoplasm of breast: Secondary | ICD-10-CM

## 2020-03-15 ENCOUNTER — Telehealth: Payer: Self-pay | Admitting: Family Medicine

## 2020-03-15 NOTE — Telephone Encounter (Signed)
Copied from [place] [id number]. Topic: Quick Communication - Rx Refill/Question >> Mar 15, 2020  3:14 PM Leward Quan A wrote: Medication: amoxicillin (AMOXIL) 500 MG tablet Has a dental appointment on 03/19/20 need Rx called in   Has the patient contacted their pharmacy? Yes.   (Agent: If no, request that the patient contact the pharmacy for the refill.) (Agent: If yes, when and what did the pharmacy advise?)  Preferred Pharmacy (with phone number or street name): [id number] [place], [place] - [address] AT [address]  Phone:  [phone] Fax:  [phone]     Agent: Please be advised that RX refills may take up to 3 business days. We ask that you follow-up with your pharmacy.

## 2020-03-16 MED ORDER — AMOXICILLIN 500 MG PO TABS: 500.0000 mg | ORAL_TABLET | ORAL | 2 refills | Status: DC

## 2020-03-16 NOTE — Telephone Encounter (Signed)
Amoxicillin 500 mg, 4 tablets 1 hour before procedure.

## 2020-03-16 NOTE — Telephone Encounter (Signed)
Done

## 2020-03-24 ENCOUNTER — Other Ambulatory Visit: Payer: Self-pay

## 2020-03-24 ENCOUNTER — Ambulatory Visit
Admission: RE | Admit: 2020-03-24 | Discharge: 2020-03-24 | Disposition: A | Discharge status: Home / Self Care | Payer: Medicare PPO | Source: Ambulatory Visit | Attending: Family Medicine | Admitting: Family Medicine

## 2020-03-24 DIAGNOSIS — Z1231 Encounter for screening mammogram for malignant neoplasm of breast: Secondary | ICD-10-CM | POA: Diagnosis present

## 2020-03-29 ENCOUNTER — Other Ambulatory Visit: Payer: Self-pay | Admitting: Family Medicine

## 2020-03-29 DIAGNOSIS — R921 Mammographic calcification found on diagnostic imaging of breast: Secondary | ICD-10-CM

## 2020-03-29 DIAGNOSIS — R928 Other abnormal and inconclusive findings on diagnostic imaging of breast: Secondary | ICD-10-CM

## 2020-04-05 ENCOUNTER — Ambulatory Visit: Payer: Medicare PPO

## 2020-04-13 ENCOUNTER — Other Ambulatory Visit: Payer: Self-pay

## 2020-04-13 ENCOUNTER — Ambulatory Visit
Admission: RE | Admit: 2020-04-13 | Discharge: 2020-04-13 | Disposition: A | Discharge status: Home / Self Care | Payer: Medicare PPO | Source: Ambulatory Visit | Attending: Family Medicine | Admitting: Family Medicine

## 2020-04-13 DIAGNOSIS — R928 Other abnormal and inconclusive findings on diagnostic imaging of breast: Secondary | ICD-10-CM | POA: Diagnosis present

## 2020-04-13 DIAGNOSIS — R921 Mammographic calcification found on diagnostic imaging of breast: Secondary | ICD-10-CM | POA: Insufficient documentation

## 2020-06-08 ENCOUNTER — Ambulatory Visit: Payer: Self-pay | Admitting: Urology

## 2020-07-05 ENCOUNTER — Other Ambulatory Visit: Payer: Self-pay | Admitting: Urology

## 2020-07-05 DIAGNOSIS — N3941 Urge incontinence: Secondary | ICD-10-CM

## 2020-07-14 ENCOUNTER — Ambulatory Visit: Payer: Self-pay | Admitting: Urology

## 2020-09-16 ENCOUNTER — Other Ambulatory Visit: Payer: Self-pay | Admitting: Family Medicine

## 2020-09-16 DIAGNOSIS — K219 Gastro-esophageal reflux disease without esophagitis: Secondary | ICD-10-CM

## 2020-09-16 NOTE — Telephone Encounter (Signed)
Requested Prescriptions  Pending Prescriptions Disp Refills  . famotidine (PEPCID) 20 MG tablet [Pharmacy Med Name: FAMOTIDINE 20 MG TABLET] 30 tablet 2    Sig: TAKE 1 TABLET BY MOUTH EVERY DAY IN THE MORNING     Gastroenterology:  H2 Antagonists Passed - 09/16/2020  1:16 AM      Passed - Valid encounter within last 12 months    Recent Outpatient Visits          9 months ago Annual physical exam   [facility] Jerrol Banana., MD   1 year ago Essential hypertension   [facility] Jerrol Banana., MD   2 years ago Preoperative clearance   [facility] Jerrol Banana., MD   3 years ago Depression, unspecified depression type   [facility] Jerrol Banana., MD   3 years ago Primary osteoarthritis involving multiple joints   [facility] Jerrol Banana., MD

## 2020-09-27 ENCOUNTER — Telehealth: Payer: Self-pay | Admitting: Family Medicine

## 2020-09-27 DIAGNOSIS — R921 Mammographic calcification found on diagnostic imaging of breast: Secondary | ICD-10-CM

## 2020-09-27 NOTE — Telephone Encounter (Signed)
Pt is calling because she is needing a follow up appointment regarding an abnormal finding with her mammogram. Pt is requesting appt with Dr. Peggye Fothergill at [facility]. Please advise CB- [phone]

## 2020-09-28 ENCOUNTER — Other Ambulatory Visit: Payer: Self-pay | Admitting: Family Medicine

## 2020-09-28 DIAGNOSIS — J309 Allergic rhinitis, unspecified: Secondary | ICD-10-CM

## 2020-09-28 MED ORDER — FLUTICASONE PROPIONATE 50 MCG/ACT NA SUSP: NASAL | 1 refills | Status: DC

## 2020-09-28 NOTE — Telephone Encounter (Signed)
I was confused on patient request until I read impression from mammogram IMPRESSION: Likely benign 1.6 cm group of developing secretory calcifications in the OUTER RIGHT breast at MIDDLE to POSTERIOR depth, as there are benign coarse linear secretory calcifications throughout both breasts on screening mammography.   RECOMMENDATION: Short-term interval follow-up and stereotactic core needle biopsy of the RIGHT breast calcifications were discussed with the patient. At this time, the patient elects short-term interval follow-up, therefore, diagnostic RIGHT mammography in occluding spot magnifications of the RIGHT breast calcifications is recommended in 6 months.  When I place this order in would it just be for a diagnostic of the right breast? Or is this recommendation also suggesting biopsy of right breast? Please review. KW

## 2020-09-28 NOTE — Telephone Encounter (Signed)
Ordered as below.

## 2020-09-28 NOTE — Telephone Encounter (Addendum)
Medication Refill - Medication:fluticasone (FLONASE) 50 MCG/ACT nasal spray  Has the patient contacted their pharmacy? yes (Agent: If no, request that the patient contact the pharmacy for the refill.) (Agent: If yes, when and what did the pharmacy advise?)contact pcp  Preferred Pharmacy (with phone number or street name): [id number] [place], [place] [address] Phone:  [phone]  Fax:  [phone]      Agent: Please be advised that RX refills may take up to 3 business days. We ask that you follow-up with your pharmacy.

## 2020-09-28 NOTE — Telephone Encounter (Signed)
Yes just place diagnostic mammo on R expected in 6 months. Usually [place] does these without [place] needing to do anything, but we can place the order. And can order under her PCP so he gets results.

## 2020-09-29 ENCOUNTER — Other Ambulatory Visit: Payer: Self-pay | Admitting: Family Medicine

## 2020-09-29 DIAGNOSIS — R921 Mammographic calcification found on diagnostic imaging of breast: Secondary | ICD-10-CM

## 2020-10-08 ENCOUNTER — Telehealth: Payer: Self-pay

## 2020-10-08 NOTE — Telephone Encounter (Signed)
Copied from [place] [id number]. Topic: General - Other >> Oct 08, 2020  2:31 PM Wynetta Emery, [place] C wrote: Reason for CRM: pt called in to request a Rx for amoxicillin. Pt says that she is having a dental procedure done and was told by her dentist to request. Pt would like to have Rx sent to    Pharmacy: [id number] - [place], [place] - [address]  Phone:  [phone] Fax:  [phone]

## 2020-10-12 NOTE — Telephone Encounter (Signed)
Tried calling; no answer.  PEC please advise pt as below if she calls back.   Thanks,   -Mickel Baas

## 2020-10-12 NOTE — Telephone Encounter (Signed)
Patient called back still insisting request for amoxicillin because says dentist recommends it. Please call back

## 2020-10-13 ENCOUNTER — Ambulatory Visit
Admission: RE | Admit: 2020-10-13 | Discharge: 2020-10-13 | Disposition: A | Discharge status: Home / Self Care | Payer: Medicare PPO | Source: Ambulatory Visit | Attending: Family Medicine | Admitting: Family Medicine

## 2020-10-13 ENCOUNTER — Other Ambulatory Visit: Payer: Self-pay | Admitting: *Deleted

## 2020-10-13 ENCOUNTER — Other Ambulatory Visit: Payer: Self-pay | Admitting: Family Medicine

## 2020-10-13 ENCOUNTER — Telehealth: Payer: Self-pay

## 2020-10-13 ENCOUNTER — Other Ambulatory Visit: Payer: Self-pay

## 2020-10-13 DIAGNOSIS — R921 Mammographic calcification found on diagnostic imaging of breast: Secondary | ICD-10-CM

## 2020-10-13 NOTE — Telephone Encounter (Signed)
Copied from [place] [phone]. Topic: Appointment Scheduling - Scheduling Inquiry for Clinic >> Oct 12, 2020  2:07 PM Bayard Beaver wrote: Reason for KPT:WSFKCLE would like to be called if earlier appt becomes available.

## 2020-10-18 ENCOUNTER — Other Ambulatory Visit: Payer: Self-pay | Admitting: Family Medicine

## 2020-10-18 DIAGNOSIS — N6310 Unspecified lump in the right breast, unspecified quadrant: Secondary | ICD-10-CM

## 2020-10-18 DIAGNOSIS — R928 Other abnormal and inconclusive findings on diagnostic imaging of breast: Secondary | ICD-10-CM

## 2020-10-19 ENCOUNTER — Ambulatory Visit
Admission: RE | Admit: 2020-10-19 | Discharge: 2020-10-19 | Disposition: A | Discharge status: Home / Self Care | Payer: Medicare PPO | Source: Ambulatory Visit | Attending: Family Medicine | Admitting: Family Medicine

## 2020-10-19 ENCOUNTER — Other Ambulatory Visit: Payer: Self-pay

## 2020-10-19 DIAGNOSIS — R928 Other abnormal and inconclusive findings on diagnostic imaging of breast: Secondary | ICD-10-CM

## 2020-10-19 DIAGNOSIS — N6310 Unspecified lump in the right breast, unspecified quadrant: Secondary | ICD-10-CM

## 2020-10-19 HISTORY — PX: BREAST BIOPSY: SHX20

## 2020-10-19 MED ORDER — AMOXICILLIN 500 MG PO TABS: 500.0000 mg | ORAL_TABLET | ORAL | 2 refills | Status: DC

## 2020-10-19 NOTE — Telephone Encounter (Signed)
Medication sent into the pharmacy. L/M advising patient.

## 2020-10-19 NOTE — Addendum Note (Signed)
Addended by: Wilburt Finlay on: 10/19/2020 10:55 AM   Modules accepted: Orders

## 2020-10-20 ENCOUNTER — Telehealth: Payer: Self-pay

## 2020-10-20 ENCOUNTER — Other Ambulatory Visit: Payer: Self-pay | Admitting: *Deleted

## 2020-10-20 ENCOUNTER — Telehealth: Payer: Self-pay | Admitting: Family Medicine

## 2020-10-20 DIAGNOSIS — C50919 Malignant neoplasm of unspecified site of unspecified female breast: Secondary | ICD-10-CM

## 2020-10-20 NOTE — Telephone Encounter (Signed)
Referral ordered

## 2020-10-20 NOTE — Telephone Encounter (Signed)
Call Report Breast Biopsy results did show breast cancer, radiologist spoke with patient and son. Radiologist states that son requested a call from Dr. Rosanna Randy tomorrow to discuss.  Son Merrily Pew [place]) [phone]

## 2020-10-20 NOTE — Telephone Encounter (Signed)
Referral Request - Has patient seen PCP for this complaint? Yes.    Referral for which specialty: Breast Cancer  Preferred provider/office: Any [place] or [facility] office.  Reason for referral: Breast cancer  *Linda from [place], called in on behalf of pt results, and needed a referral to see a specialist, pt directly asked for it to be in an office in [place] or [place], and would like to speak with PCP before deciding either one, so she can make that decisions, due to having two sons that are in the area and work at some locations. Please advise.

## 2020-10-20 NOTE — Telephone Encounter (Signed)
Please review. Thanks!  

## 2020-10-21 ENCOUNTER — Telehealth: Payer: Self-pay | Admitting: *Deleted

## 2020-10-21 ENCOUNTER — Telehealth: Payer: Self-pay | Admitting: Family Medicine

## 2020-10-21 NOTE — Telephone Encounter (Signed)
Per referral- called and lvm of upcoming appoint with Dr. Marin Olp - requested callback to confirm and to verify home address to send welcome packet and calendar. Also called the son and lvm with him as well.

## 2020-10-21 NOTE — Telephone Encounter (Signed)
Is it okay to refer pt?  Thanks,   -Mickel Baas

## 2020-10-21 NOTE — Telephone Encounter (Signed)
Yes, I thought the order was in already,if not then yes.

## 2020-10-21 NOTE — Telephone Encounter (Signed)
Copied from [place] [id number]. Topic: Referral - Request for Referral >> Oct 21, 2020  2:18 PM Leward Quan A wrote: Has patient seen PCP for this complaint? Yes.   *If NO, is insurance requiring patient see PCP for this issue before PCP can refer them? Referral for which specialty: Surgeon  Preferred provider/office: [facility] Fax # [phone] Reason for referral: Cancer

## 2020-10-27 ENCOUNTER — Other Ambulatory Visit: Payer: Self-pay

## 2020-10-27 ENCOUNTER — Encounter: Payer: Self-pay | Admitting: *Deleted

## 2020-10-27 ENCOUNTER — Inpatient Hospital Stay: Payer: Medicare PPO | Attending: Hematology & Oncology

## 2020-10-27 ENCOUNTER — Ambulatory Visit: Payer: Self-pay | Attending: Internal Medicine

## 2020-10-27 ENCOUNTER — Inpatient Hospital Stay: Payer: Medicare PPO | Admitting: Hematology & Oncology

## 2020-10-27 ENCOUNTER — Encounter: Payer: Self-pay | Admitting: Hematology & Oncology

## 2020-10-27 VITALS — BP 143/77 | HR 89 | Temp 98.0°F | Resp 16 | Wt 254.0 lb

## 2020-10-27 DIAGNOSIS — Z23 Encounter for immunization: Secondary | ICD-10-CM

## 2020-10-27 DIAGNOSIS — Z7189 Other specified counseling: Secondary | ICD-10-CM

## 2020-10-27 DIAGNOSIS — Z853 Personal history of malignant neoplasm of breast: Secondary | ICD-10-CM | POA: Diagnosis not present

## 2020-10-27 DIAGNOSIS — C50911 Malignant neoplasm of unspecified site of right female breast: Secondary | ICD-10-CM

## 2020-10-27 LAB — CMP (CANCER CENTER ONLY)
ALT: 14 U/L (ref 0–44)
AST: 15 U/L (ref 15–41)
Albumin: 4 g/dL (ref 3.5–5.0)
Alkaline Phosphatase: 59 U/L (ref 38–126)
Anion gap: 6 (ref 5–15)
BUN: 17 mg/dL (ref 8–23)
CO2: 29 mmol/L (ref 22–32)
Calcium: 10 mg/dL (ref 8.9–10.3)
Chloride: 107 mmol/L (ref 98–111)
Creatinine: 0.96 mg/dL (ref 0.44–1.00)
GFR, Estimated: 58 mL/min — ABNORMAL LOW (ref >60)
Glucose, Bld: 99 mg/dL (ref 70–99)
Potassium: 4.2 mmol/L (ref 3.5–5.1)
Sodium: 142 mmol/L (ref 135–145)
Total Bilirubin: 0.5 mg/dL (ref 0.3–1.2)
Total Protein: 7.3 g/dL (ref 6.5–8.1)

## 2020-10-27 LAB — CBC WITH DIFFERENTIAL (CANCER CENTER ONLY)
Abs Immature Granulocytes: 0.03 10*3/uL (ref 0.00–0.07)
Basophils Absolute: 0.1 10*3/uL (ref 0.0–0.1)
Basophils Relative: 1 %
Eosinophils Absolute: 0.2 10*3/uL (ref 0.0–0.5)
Eosinophils Relative: 2 %
HCT: 43.5 % (ref 36.0–46.0)
Hemoglobin: 14 g/dL (ref 12.0–15.0)
Immature Granulocytes: 0 %
Lymphocytes Relative: 26 %
Lymphs Abs: 2 10*3/uL (ref 0.7–4.0)
MCH: 31 pg (ref 26.0–34.0)
MCHC: 32.2 g/dL (ref 30.0–36.0)
MCV: 96.2 fL (ref 80.0–100.0)
Monocytes Absolute: 0.8 10*3/uL (ref 0.1–1.0)
Monocytes Relative: 11 %
Neutro Abs: 4.5 10*3/uL (ref 1.7–7.7)
Neutrophils Relative %: 60 %
Platelet Count: 292 10*3/uL (ref 150–400)
RBC: 4.52 MIL/uL (ref 3.87–5.11)
RDW: 12.8 % (ref 11.5–15.5)
WBC Count: 7.5 10*3/uL (ref 4.0–10.5)
nRBC: 0 % (ref 0.0–0.2)

## 2020-10-27 LAB — LACTATE DEHYDROGENASE: LDH: 140 U/L (ref 98–192)

## 2020-10-27 NOTE — Progress Notes (Signed)
Initial RN Navigator Patient Visit  Name: Lisa Paul Date of Referral : 10/20/2020 Diagnosis: R Breast Invasive Mammary Carcinoma  Met with patient, and her son, Ted,  prior to their visit with MD. Gave patient "Your Patient Navigator" handout which explains my role, areas in which I am able to help, and all the contact information for myself and the office. Also gave patient MD and Navigator business card. Reviewed with patient the general overview of expected course after initial diagnosis and time frame for all steps to be completed.  New patient packet given to patient which includes: orientation to office and staff; campus directory; education on My Chart and Advance Directives; and patient centered education on Breast Cancer.   Patient has 2 sons. The son her today is more involved, however he lives in [place] and has a job which requires travel. Her other son is local. They ask several questions about surgery and/or treatment in [place], and if radiation is needed, possibly having this in [place].   Patient completed visit with Dr. Ennever  Revisited with patient after MD visit. Patient will need a referral to Mark Arredondo a general surgeon at [facility] per patient request. Order placed and scheduling notified of order.   Patient understands all follow up procedures and expectations. They have my number to reach out for any further clarification or additional needs.   Oncology Nurse Navigator Documentation  Oncology Nurse Navigator Flowsheets 10/27/2020  Abnormal Finding Date 10/13/2020  Confirmed Diagnosis Date 10/19/2020  Diagnosis Status Additional Work Up  Navigator Follow Up Date: 10/29/2020  Navigator Follow Up Reason: Appointment Review  Navigator Location [facility]  Referral Date to RadOnc/MedOnc 10/20/2020  Navigator Encounter Type Initial MedOnc  Patient Visit Type MedOnc  Treatment Phase Pre-Tx/Tx Discussion  Barriers/Navigation Needs  Coordination of Care;Education;Family Concerns;Transportation  Education Other;Newly Diagnosed Cancer Education  Interventions Coordination of Care;Education;Psycho-Social Support;Referrals  Acuity Level 3-Moderate Needs (3-4 Barriers Identified)  Referrals Other  Education Method Verbal;Written  Support Groups/Services Friends and Family  Time Spent with Patient 45      

## 2020-10-27 NOTE — Progress Notes (Signed)
   Covid-19 Vaccination Clinic  Name:  Lisa Paul    MRN: [id number] DOB: 1934/10/11  10/27/2020  [name] was observed post Covid-19 immunization for 15 minutes without incident. She was provided with Vaccine Information Sheet and instruction to access the V-Safe system.   [name] was instructed to call 911 with any severe reactions post vaccine: Difficulty breathing  Swelling of face and throat  A fast heartbeat  A bad rash all over body  Dizziness and weakness   Immunizations Administered     Name Date Dose VIS Date Route   PFIZER Comrnaty(Gray TOP) Covid-19 Vaccine 10/27/2020 12:07 PM 0.3 mL 03/11/2020 Intramuscular   Manufacturer: [place]   Lot: [id number]   [place]: [phone]

## 2020-10-28 ENCOUNTER — Telehealth: Payer: Self-pay

## 2020-10-28 NOTE — Telephone Encounter (Signed)
No 10/27/20 LOS noted   Natosha Bou

## 2020-10-29 ENCOUNTER — Encounter: Payer: Self-pay | Admitting: Hematology & Oncology

## 2020-10-29 ENCOUNTER — Encounter: Payer: Self-pay | Admitting: *Deleted

## 2020-10-29 DIAGNOSIS — C50911 Malignant neoplasm of unspecified site of right female breast: Secondary | ICD-10-CM

## 2020-10-29 DIAGNOSIS — Z170 Estrogen receptor positive status [ER+]: Secondary | ICD-10-CM | POA: Insufficient documentation

## 2020-10-29 DIAGNOSIS — Z7189 Other specified counseling: Secondary | ICD-10-CM

## 2020-10-29 HISTORY — DX: Malignant neoplasm of unspecified site of right female breast: C50.911

## 2020-10-29 HISTORY — DX: Estrogen receptor positive status (ER+): Z17.0

## 2020-10-29 HISTORY — DX: Other specified counseling: Z71.89

## 2020-10-29 NOTE — Progress Notes (Signed)
Called the office of Dr Arrendondo at [phone] to confirm receipt of referral.  Referral received and patient scheduled for 11/26/2020 at 3pm. Updated Dr Marin Olp.   Oncology Nurse Navigator Documentation  Oncology Nurse Navigator Flowsheets 10/29/2020  Abnormal Finding Date -  Confirmed Diagnosis Date -  Diagnosis Status -  Navigator Follow Up Date: 11/26/2020  Navigator Follow Up Reason: Appointment Review  Navigator Location [facility]  Referral Date to RadOnc/MedOnc -  Navigator Encounter Type Appt/Treatment Plan Review;Telephone  Telephone Outgoing Call  Patient Visit Type MedOnc  Treatment Phase Pre-Tx/Tx Discussion  Barriers/Navigation Needs Coordination of Care;Education;Family Concerns;Transportation  Education -  Interventions Coordination of Care  Acuity Level 3-Moderate Needs (3-4 Barriers Identified)  Referrals -  Coordination of Care Other  Education Method -  Support Groups/Services Friends and Family  Time Spent with Patient 15

## 2020-10-29 NOTE — Progress Notes (Unsigned)
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  Chief Complaint  Patient presents with   New Patient (Initial Visit)  : I have another breast cancer.  HPI: Lisa Paul is a very charming 86 year old white female.  She comes in with her son.  She is in a wheelchair.  She apparently has a history of what appears to be a stage I breast cancer of the left breast about 8 or 9 years ago.  She underwent lumpectomy.  She does not recall any type of therapy afterwards.  She had she had a mammogram done back in December 2021.  There was some calcifications in the right breast.  She did have a diagnostic mammogram and on 04/13/2020.  This showed likely benign 1.6 cm group of calcifications in the outer right breast.  She then had another mammogram on 10/13/2020.  This showed a 1.4 x 0.6 x 1.2 cm mildly heterogeneous complex mass at the 9 o'clock position in the right breast.  There is no evidence of right axillary adenopathy.  She then underwent a biopsy.  This was done on 10/19/2020.  The pathology report ([id number]) showed an invasive mammary carcinoma.  It was 8 mm.  He had a total histologic score of 8.  There is no lymphovascular space invasion.  There is no carcinoma in situ.  The tumor was ER positive and PR positive.  The HER2 testing was equivocal.  She is yet to see a Psychologist, sport and exercise.  She [facility] to [facility].  She would like to have surgery there.  Again, she had breast cancer in the left breast about 8 or 9 years ago.  She really cannot give much history about this.  She has not been on type of estrogens.  She has had multiple surgeries in the past.  There is no cough or shortness of breath.  She does have history of sleep apnea.  There is no change in bowel or bladder habits.  There is no family history of breast cancer.  Overall, her performance status is probably ECOG 2.     Past Medical History:  Diagnosis  Date   Allergy    Anxiety    Arthritis 1993   Cancer ([place]) 2012   Left Breast with lumpectomy and mammosite   Cataract    Colon polyp 2005   Dyspnea    GERD (gastroesophageal reflux disease)    Hypertension 15 years   Incontinence of urine    Lower extremity edema    Malignant neoplasm of breast (female), unspecified site    left breast   Neuropathy    Personal history of radiation therapy 2012   mammosite   Sleep apnea   :   Past Surgical History:  Procedure Laterality Date   ABDOMINAL HYSTERECTOMY  30 years ago   BREAST BIOPSY Left 2012   invasive mammary carcinoma   BREAST BIOPSY Right 10/19/2020   US biopsy/ path pending   BREAST LUMPECTOMY Left 2012   CARPAL TUNNEL RELEASE Bilateral    COLONOSCOPY  05/2003   Dr Nicolasa D[facility]   COLONOSCOPY  07/24/2012   EYE SURGERY Bilateral 2005   cataract   JOINT REPLACEMENT Bilateral 2007   knees   mammosite balloon placement Left 08/2010   Removal 09/2010   TONSILLECTOMY  age 44   TOTAL HIP ARTHROPLASTY Right 10/03/2017   Procedure: [place];  Surgeon: Lovell Sheehan, MD;  Location: [facility]  ORS;  Service: Orthopedics;  Laterality: Right;  :   Current Outpatient Medications:    acetaminophen (TYLENOL) 500 MG tablet, Take 2 tablets (1,000 mg total) by mouth every 8 (eight) hours as needed. (Patient taking differently: Take 500 mg by mouth every 8 (eight) hours as needed (for pain. (with tramadol)). ), Disp: 30 tablet, Rfl: 0   amoxicillin (AMOXIL) 500 MG tablet, Take 1 tablet (500 mg total) by mouth as directed. 4 tablets prior to dental procedures, Disp: 4 tablet, Rfl: 2   Cyanocobalamin (B-12) 1000 MCG TABS, Take 1 tablet by mouth daily. (Patient taking differently: Take 1,000 mcg by mouth daily. ), Disp: 30 tablet, Rfl:    diphenhydrAMINE (BENADRYL) 25 mg capsule, Take 25 mg by mouth every 6 (six) hours as needed. Equate brand - taking @@ night for runny eyes, Disp: , Rfl:    erythromycin  ophthalmic ointment, Place 1 application into both eyes 2 (two) times daily., Disp: 3.5 g, Rfl: 0   famotidine (PEPCID) 20 MG tablet, TAKE 1 TABLET BY MOUTH EVERY DAY IN THE MORNING, Disp: 30 tablet, Rfl: 2   fluticasone (FLONASE) 50 MCG/ACT nasal spray, SPRAY 2 SPRAYS INTO EACH NOSTRIL EVERY DAY, Disp: 16 mL, Rfl: 1   guaiFENesin (MUCINEX) 600 MG 12 hr tablet, Take 600 mg by mouth 2 (two) times daily. As needed for runny eyes, Disp: , Rfl:    hydrochlorothiazide (HYDRODIURIL) 25 MG tablet, TAKE 1 TABLET BY MOUTH EVERY DAY, Disp: 90 tablet, Rfl: 1   losartan-hydrochlorothiazide (HYZAAR) 100-25 MG tablet, Take 1 tablet by mouth daily., Disp: 90 tablet, Rfl: 3   Multiple Vitamin (MULTIVITAMIN WITH MINERALS) TABS tablet, Take 1 tablet by mouth daily. Women's Daily Multivitamin, Disp: , Rfl:    MYRBETRIQ 50 MG TB24 tablet, TAKE 1 TABLET BY MOUTH EVERY DAY, Disp: 90 tablet, Rfl: 2   Polyethyl Glycol-Propyl Glycol (LUBRICANT EYE DROPS) 0.4-0.3 % SOLN, Place 1 drop into both eyes 2 (two) times daily as needed (for dry eyes.)., Disp: , Rfl:    ranitidine (ZANTAC) 150 MG tablet, TAKE 1 TABLET BY MOUTH IN THE MORNING. MAY REPEAT DOSE IN THE EVENING IF NEEDED FOR ACID REFLUX OR INDIGESTION. (Patient not taking: Reported on 12/11/2019), Disp: 60 tablet, Rfl: 1:  :   Allergies  Allergen Reactions   Tape     Paper tape - blisters  :   Family History  Problem Relation Age of Onset   Colon cancer Mother    Stroke Brother        possibly. Pt is unsure.   Breast cancer Maternal Aunt    Prostate cancer Neg Hx    Bladder Cancer Neg Hx    Kidney cancer Neg Hx   :   Social History   Socioeconomic History   Marital status: Widowed    Spouse name: Not on file   Number of children: 2   Years of education: Not on file   Highest education level: Master's degree (e.g., MA, MS, MEng, MEd, MSW, MBA)  Occupational History   Occupation: retired  Tobacco Use   Smoking status: Never   Smokeless tobacco:  Never  Vaping Use   Vaping Use: Never used  Substance and Sexual Activity   Alcohol use: No   Drug use: No   Sexual activity: Never  Other Topics Concern   Not on file  Social History Narrative   Not on file   Social Determinants of Health   Financial Resource Strain: Not on file  Food  Insecurity: Not on file  Transportation Needs: Not on file  Physical Activity: Not on file  Stress: Not on file  Social Connections: Not on file  Intimate Partner Violence: Not on file  :  Review of Systems  Constitutional: Negative.   HENT: Negative.    Eyes: Negative.   Respiratory: Negative.    Cardiovascular: Negative.   Gastrointestinal: Negative.   Genitourinary: Negative.   Musculoskeletal: Negative.   Skin: Negative.   Neurological: Negative.   Endo/Heme/Allergies: Negative.   Psychiatric/Behavioral: Negative.      Exam: @IPVITALS @ This is a well-developed well-nourished elderly white female in no obvious distress.  Vital signs show temperature of 98.  Pulse 89.  Blood pressure 143/77.  Weight is 254 pounds.  Head neck exam shows no ocular or oral lesions.  She has no adenopathy in the neck.  There is no supraclavicular adenopathy.  Lungs are clear bilaterally.  Cardiac exam regular rate and rhythm.  Breast exam shows left breast with a lumpectomy in the upper outer quadrant of the left breast.  This is well-healed.  There is no masses.  There is no swelling.  There is no left axillary adenopathy.  Right breast shows the biopsy site at about the 9 o'clock position.  This has little bit of ecchymoses.  There is no nipple discharge.  There is no right axillary adenopathy.  Abdomen is soft.  She is somewhat obese.  She has no fluid wave.  There is no palpable liver or spleen tip.  Back exam shows no tenderness over the spine, ribs or hips.  Extremities shows no clubbing, cyanosis or edema.  Neurological exam shows no focal neurological deficits.  Skin exam shows no rashes, ecchymosis or  petechia.   Recent Labs    10/27/20 1041  WBC 7.5  HGB 14.0  HCT 43.5  PLT 292    Recent Labs    10/27/20 1041  NA 142  K 4.2  CL 107  CO2 29  GLUCOSE 99  BUN 17  CREATININE 0.96  CALCIUM 10.0    Blood smear review: None  Pathology: See above    Assessment and Plan: Lisa Paul is a very charming 86 year old white female.  I suspect this can be a second breast cancer.  I do believe this can be stage I.  I think a lumpectomy would be adequate for her.  At her age, I am unsure if she would need any radiation therapy.  I does not sure what benefit she would get.  Of course, we would have to make a final decision depending on the formal surgical results.  Even if she was HER2 positive, I would not give her any anti-HER2 therapy.  Again, I would think that all she would need would be antiestrogen.  We will make the referral to a local surgeon.  This will be a lot easier for her.  I suspect that surgery probably would not be done for another 2 or 3 weeks.  I would like to try to get her back to see [place] in about 6 weeks or so.  By then, we will have all information that we will need to make any final recommendations.  From what I see so far, have to believe that the risk of recurrence for this tumor is going to be less than 10%.  I spent a good hour with she and her son.  They are both very nice.

## 2020-11-01 ENCOUNTER — Encounter: Payer: Self-pay | Admitting: Hematology & Oncology

## 2020-11-02 ENCOUNTER — Other Ambulatory Visit (HOSPITAL_BASED_OUTPATIENT_CLINIC_OR_DEPARTMENT_OTHER): Payer: Self-pay

## 2020-11-02 MED ORDER — COVID-19 MRNA VAC-TRIS(PFIZER) 30 MCG/0.3ML IM SUSP
INTRAMUSCULAR | 0 refills | Status: DC
  Filled 2020-11-02: qty 0.3, 1d supply, fill #0

## 2020-11-08 ENCOUNTER — Encounter: Payer: Self-pay | Admitting: Hematology & Oncology

## 2020-11-08 LAB — SURGICAL PATHOLOGY

## 2020-11-09 ENCOUNTER — Telehealth: Payer: Self-pay | Admitting: *Deleted

## 2020-11-09 NOTE — Telephone Encounter (Signed)
As noted below by Dr. Marin Olp, I left a message stating that the HER2 testing is negative and this is a good result. Instructed her to call if she had any questions or concerns.

## 2020-11-09 NOTE — Telephone Encounter (Signed)
-----  Message from [name], MD sent at 11/08/2020  5:18 PM EDT ----- Please call her and let her know that the HER2 testing is negative.  This is a good result.  Lisa Paul

## 2020-11-28 DIAGNOSIS — C50511 Malignant neoplasm of lower-outer quadrant of right female breast: Secondary | ICD-10-CM | POA: Insufficient documentation

## 2020-11-28 DIAGNOSIS — Z170 Estrogen receptor positive status [ER+]: Secondary | ICD-10-CM | POA: Insufficient documentation

## 2020-11-29 ENCOUNTER — Encounter: Payer: Self-pay | Admitting: *Deleted

## 2020-11-29 NOTE — Progress Notes (Signed)
Patient seen by Dr Adair Laundry and lumpectomy scheduled for 12/16/2020.   Patient scheduled for follow up with Dr Marin Olp for about 3 weeks post op. Spoke to son, [name], and he is aware of follow up appointment.   Oncology Nurse Navigator Documentation  Oncology Nurse Navigator Flowsheets 11/29/2020  Abnormal Finding Date -  Confirmed Diagnosis Date -  Diagnosis Status -  Phase of Treatment Surgery  Expected Surgery Date 12/16/2020  Navigator Follow Up Date: 12/16/2020  Navigator Follow Up Reason: Surgery  Navigator Location [facility]  Referral Date to RadOnc/MedOnc -  Navigator Encounter Type Appt/Treatment Plan Review;Telephone  Telephone Appt Confirmation/Clarification;Outgoing Call  Patient Visit Type MedOnc  Treatment Phase Pre-Tx/Tx Discussion  Barriers/Navigation Needs Coordination of Care;Education;Family Concerns;Transportation  Education Other  Interventions Coordination of Care  Acuity Level 3-Moderate Needs (3-4 Barriers Identified)  Referrals -  Coordination of Care Appts  Education Method Verbal  Support Groups/Services Friends and Family  Time Spent with Patient 30

## 2020-12-10 ENCOUNTER — Other Ambulatory Visit: Payer: Self-pay | Admitting: Family Medicine

## 2020-12-10 DIAGNOSIS — I10 Essential (primary) hypertension: Secondary | ICD-10-CM

## 2020-12-16 ENCOUNTER — Encounter: Payer: Self-pay | Admitting: *Deleted

## 2020-12-16 NOTE — Progress Notes (Signed)
Oncology Nurse Navigator Documentation  Oncology Nurse Navigator Flowsheets 12/16/2020  Abnormal Finding Date -  Confirmed Diagnosis Date -  Diagnosis Status -  Phase of Treatment Surgery  Expected Surgery Date -  Surgery Actual Start Date: 12/16/2020  Navigator Follow Up Date: 01/10/2021  Navigator Follow Up Reason: Follow-up Appointment  Navigator Location [facility]  Referral Date to RadOnc/MedOnc -  Navigator Encounter Type Appt/Treatment Plan Review  Telephone -  Treatment Initiated Date 12/16/2020  Patient Visit Type MedOnc  Treatment Phase Active Tx  Barriers/Navigation Needs Coordination of Care  Education -  Interventions None Required  Acuity Level 2-Minimal Needs (1-2 Barriers Identified)  Referrals -  Coordination of Care -  Education Method -  Support Groups/Services Friends and Family  Time Spent with Patient 15

## 2020-12-22 ENCOUNTER — Encounter: Payer: Self-pay | Admitting: *Deleted

## 2020-12-22 NOTE — Progress Notes (Signed)
Pathology from outside system reviewed. All margins negative, ER/PR + HER2 -.  Path report given to Dr Marin Olp for review. No further testing requests.   Oncology Nurse Navigator Documentation  Oncology Nurse Navigator Flowsheets 12/22/2020  Abnormal Finding Date -  Confirmed Diagnosis Date -  Diagnosis Status -  Phase of Treatment -  Expected Surgery Date -  Surgery Actual Start Date: -  Navigator Follow Up Date: 01/10/2021  Navigator Follow Up Reason: Follow-up Appointment  Navigator Location [facility]  Referral Date to RadOnc/MedOnc -  Navigator Encounter Type Pathology Review  Telephone -  Treatment Initiated Date -  Patient Visit Type MedOnc  Treatment Phase Active Tx  Barriers/Navigation Needs Coordination of Care  Education -  Interventions Coordination of Care  Acuity Level 2-Minimal Needs (1-2 Barriers Identified)  Referrals -  Coordination of Care Pathology  Education Method -  Support Groups/Services -  Time Spent with Patient 30

## 2020-12-31 ENCOUNTER — Ambulatory Visit (INDEPENDENT_AMBULATORY_CARE_PROVIDER_SITE_OTHER): Payer: Medicare PPO

## 2020-12-31 DIAGNOSIS — Z0000 Encounter for general adult medical examination without abnormal findings: Secondary | ICD-10-CM

## 2020-12-31 NOTE — Progress Notes (Addendum)
Subjective:   Lisa Paul is a 86 y.o. female who presents for Medicare Annual (Subsequent) preventive examination. I connected with  [name] on 12/31/20 by an audio only telemedicine application and verified that I am speaking with the correct person using two identifiers.   I discussed the limitations, risks, security and privacy concerns of performing an evaluation and management service by telephone and the availability of in person appointments. I also discussed with the patient that there may be a patient responsible charge related to this service. The patient expressed understanding and verbally consented to this telephonic visit.  Location of Patient: Home Location of Provider: Office  List any persons and their role that are participating in the visit with the patient.    Review of Systems    Defer       Objective:    There were no vitals filed for this visit. There is no height or weight on file to calculate BMI.  Advanced Directives 10/27/2020 08/11/2019 10/03/2017 09/19/2017 11/09/2016 11/03/2016 05/10/2016  Does Patient Have a Medical Advance Directive? Yes Yes Yes Yes No Yes Yes  Type of Paramedic of [place];Living will Healthcare Power of [place];Living will  Does patient want to make changes to medical advance directive? No - Patient declined - No - Patient declined - - - -  Copy of [place] in Chart? - Yes - validated most recent copy scanned in chart (See row information) No - copy requested - - - (No Data)    Current Medications (verified) Outpatient Encounter Medications as of 12/31/2020  Medication Sig   acetaminophen (TYLENOL) 500 MG tablet Take 2 tablets (1,000 mg total) by mouth every 8 (eight) hours as needed. (Patient taking differently: Take 500 mg by mouth every 8 (eight) hours as needed (for pain. (with tramadol)). )    amoxicillin (AMOXIL) 500 MG tablet Take 1 tablet (500 mg total) by mouth as directed. 4 tablets prior to dental procedures   COVID-19 mRNA Vac-TriS, Pfizer, SUSP injection Inject into the muscle.   Cyanocobalamin (B-12) 1000 MCG TABS Take 1 tablet by mouth daily. (Patient taking differently: Take 1,000 mcg by mouth daily. )   diphenhydrAMINE (BENADRYL) 25 mg capsule Take 25 mg by mouth every 6 (six) hours as needed. Equate brand - taking @@ night for runny eyes   erythromycin ophthalmic ointment Place 1 application into both eyes 2 (two) times daily.   famotidine (PEPCID) 20 MG tablet TAKE 1 TABLET BY MOUTH EVERY DAY IN THE MORNING   fluticasone (FLONASE) 50 MCG/ACT nasal spray SPRAY 2 SPRAYS INTO EACH NOSTRIL EVERY DAY   guaiFENesin (MUCINEX) 600 MG 12 hr tablet Take 600 mg by mouth 2 (two) times daily. As needed for runny eyes   hydrochlorothiazide (HYDRODIURIL) 25 MG tablet TAKE 1 TABLET BY MOUTH EVERY DAY   losartan-hydrochlorothiazide (HYZAAR) 100-25 MG tablet TAKE 1 TABLET BY MOUTH EVERY DAY   Multiple Vitamin (MULTIVITAMIN WITH MINERALS) TABS tablet Take 1 tablet by mouth daily. Women's Daily Multivitamin   MYRBETRIQ 50 MG TB24 tablet TAKE 1 TABLET BY MOUTH EVERY DAY   Polyethyl Glycol-Propyl Glycol (LUBRICANT EYE DROPS) 0.4-0.3 % SOLN Place 1 drop into both eyes 2 (two) times daily as needed (for dry eyes.).   ranitidine (ZANTAC) 150 MG tablet TAKE 1 TABLET BY MOUTH IN THE MORNING. MAY REPEAT DOSE IN THE EVENING IF NEEDED FOR ACID  REFLUX OR INDIGESTION. (Patient not taking: Reported on 12/11/2019)   No facility-administered encounter medications on file as of 12/31/2020.    Allergies (verified) Tape   History: Past Medical History:  Diagnosis Date   Allergy    Anxiety    Arthritis 1993   Cancer ([place]) 2012   Left Breast with lumpectomy and mammosite   Cataract    Colon polyp 2005   Dyspnea    GERD (gastroesophageal reflux disease)    Goals of care, counseling/discussion  10/29/2020   Hypertension 15 years   Incontinence of urine    Lower extremity edema    Malignant neoplasm of breast (female), unspecified site    left breast   Neuropathy    Personal history of radiation therapy 2012   mammosite   Sleep apnea    Stage 1 breast cancer, ER+, right ([place]) 10/29/2020   Past Surgical History:  Procedure Laterality Date   ABDOMINAL HYSTERECTOMY  30 years ago   BREAST BIOPSY Left 2012   invasive mammary carcinoma   BREAST BIOPSY Right 10/19/2020   US biopsy/ path pending   BREAST LUMPECTOMY Left 2012   CARPAL TUNNEL RELEASE Bilateral    COLONOSCOPY  05/2003   Dr Nicolasa Ducking [facility]   COLONOSCOPY  07/24/2012   EYE SURGERY Bilateral 2005   cataract   JOINT REPLACEMENT Bilateral 2007   knees   mammosite balloon placement Left 08/2010   Removal 09/2010   TONSILLECTOMY  age 86   TOTAL HIP ARTHROPLASTY Right 10/03/2017   Procedure: [place];  Surgeon: Lovell Sheehan, MD;  Location: [facility] ORS;  Service: Orthopedics;  Laterality: Right;   Family History  Problem Relation Age of Onset   Colon cancer Mother    Stroke Brother        possibly. Pt is unsure.   Breast cancer Maternal Aunt    Prostate cancer Neg Hx    Bladder Cancer Neg Hx    Kidney cancer Neg Hx    Social History   Socioeconomic History   Marital status: Widowed    Spouse name: Not on file   Number of children: 2   Years of education: Not on file   Highest education level: Master's degree (e.g., MA, MS, MEng, MEd, MSW, MBA)  Occupational History   Occupation: retired  Tobacco Use   Smoking status: Never   Smokeless tobacco: Never  Vaping Use   Vaping Use: Never used  Substance and Sexual Activity   Alcohol use: No   Drug use: No   Sexual activity: Never  Other Topics Concern   Not on file  Social History Narrative   Not on file   Social Determinants of Health   Financial Resource Strain: Not on file  Food Insecurity: Not on file  Transportation  Needs: Not on file  Physical Activity: Not on file  Stress: Not on file  Social Connections: Not on file    Tobacco Counseling Counseling given: Not Answered   Clinical Intake:                 Diabetic?No         Activities of Daily Living No flowsheet data found.  Patient Care Team: Jerrol Banana., MD as PCP - General (Unknown Physician Specialty) Dingeldein, Remo Lipps, MD as Consulting Physician (Ophthalmology) Clyde Canterbury, MD as Referring Physician (Otolaryngology) Ralene Bathe, MD as Consulting Physician (Dermatology) [name], MD as Consulting Physician (Oncology) Cordelia Poche, RN as Oncology  Nurse Navigator  Indicate any recent Medical Services you may have received from other than [facility] providers in the past year (date may be approximate).     Assessment:   This is a routine wellness examination for [place].  Hearing/Vision screen No results found.  Dietary issues and exercise activities discussed:     Goals Addressed   None   Depression Screen PHQ 2/9 Scores 12/11/2019 08/11/2019 06/09/2019 07/30/2017 03/28/2017 10/26/2016 09/26/2016  PHQ - 2 Score 2 0 3 1 2 2 3   PHQ- 9 Score 3 - 8 5 8 6 10     Fall Risk Fall Risk  08/11/2019 03/11/2019 05/10/2016 10/14/2015 05/10/2015  Falls in the past year? 0 0 No No No  Number falls in past yr: 0 0 - - -  Injury with Fall? 0 0 - - -    FALL RISK PREVENTION PERTAINING TO THE HOME:  Any stairs in or around the home? Yes  If so, are there any without handrails? No  Home free of loose throw rugs in walkways, pet beds, electrical cords, etc? Yes  Adequate lighting in your home to reduce risk of falls? No   ASSISTIVE DEVICES UTILIZED TO PREVENT FALLS:  Life alert? No  Use of a cane, walker or w/c? Yes  Grab bars in the bathroom? Yes  Shower chair or bench in shower? Yes  Elevated toilet seat or a handicapped toilet? Yes   TIMED UP AND GO:  Was the test performed? No .  Length of time to  ambulate 10 feet: n/a sec.    Cognitive Function: MMSE - Mini Mental State Exam 12/11/2019 07/30/2017  Orientation to time 4 5  Orientation to Place 5 5  Registration 3 3  Attention/ Calculation 5 5  Recall 3 2  Language- name 2 objects 2 2  Language- repeat 1 1  Language- follow 3 step command 3 3  Language- read & follow direction 1 1  Write a sentence 1 1  Copy design 1 0  Total score 29 28     6CIT Screen 05/10/2016  What Year? 0 points  What month? 0 points  What time? 0 points  Count back from 20 0 points  Months in reverse 0 points  Repeat phrase 6 points  Total Score 6    Immunizations Immunization History  Administered Date(s) Administered   Fluad Quad(high Dose 65+) 01/14/2019, 12/11/2019   Influenza, High Dose Seasonal PF 02/18/2015, 04/28/2016, 01/24/2017, 01/23/2018   Influenza-Unspecified 01/01/2013   PFIZER Comirnaty(Gray Top)Covid-19 Tri-Sucrose Vaccine 05/15/2019, 03/12/2020, 10/27/2020   PFIZER(Purple Top)SARS-COV-2 Vaccination 05/15/2019, 03/12/2020   Pneumococcal Conjugate-13 05/07/2014   Pneumococcal Polysaccharide-23 02/15/1998, 07/11/2011   Td 03/12/2003   Tdap 05/07/2014   Zoster, Live 06/08/2007    TDAP status: Up to date  Flu Vaccine status: Due, Education has been provided regarding the importance of this vaccine. Advised may receive this vaccine at local pharmacy or Health Dept. Aware to provide a copy of the vaccination record if obtained from local pharmacy or Health Dept. Verbalized acceptance and understanding.  Pneumococcal vaccine status: Up to date  Covid-19 vaccine status: Completed vaccines  Qualifies for Shingles Vaccine? Yes   Zostavax completed No   Shingrix Completed?: Yes  Screening Tests Health Maintenance  Topic Date Due   Zoster Vaccines- Shingrix (1 of 2) Never done   INFLUENZA VACCINE  11/01/2020   COVID-19 Vaccine (4 - Booster for Pfizer series) 01/19/2021   TETANUS/TDAP  05/07/2024   DEXA SCAN  Completed  HPV  VACCINES  Aged Out    Health Maintenance  Health Maintenance Due  Topic Date Due   Zoster Vaccines- Shingrix (1 of 2) Never done   INFLUENZA VACCINE  11/01/2020    Colorectal cancer screening: No longer required.   Mammogram status: No longer required due to age.  Bone Density status: Completed 07/01/14. Results reflect: Bone density results: OSTEOPOROSIS. Repeat every 2 years.  Lung Cancer Screening: (Low Dose CT Chest recommended if Age 98-80 years, 30 pack-year currently smoking OR have quit w/in 15years.) does not qualify.   Lung Cancer Screening Referral: n/a  Additional Screening:  Hepatitis C Screening: does not qualify; Completed n/a  Vision Screening: Recommended annual ophthalmology exams for early detection of glaucoma and other disorders of the eye. Is the patient up to date with their annual eye exam?  Yes  Who is the provider or what is the name of the office in which the patient attends annual eye exams? Dingeldein, Remo Lipps, MD  If pt is not established with a provider, would they like to be referred to a provider to establish care?  N/a .   Dental Screening: Recommended annual dental exams for proper oral hygiene  Community Resource Referral / Chronic Care Management: CRR required this visit?  No   CCM required this visit?  No      Plan:     I have personally reviewed and noted the following in the patient's chart:   Medical and social history Use of alcohol, tobacco or illicit drugs  Current medications and supplements including opioid prescriptions.  Functional ability and status Nutritional status Physical activity Advanced directives List of other physicians Hospitalizations, surgeries, and ER visits in previous 12 months Vitals Screenings to include cognitive, depression, and falls Referrals and appointments  In addition, I have reviewed and discussed with patient certain preventive protocols, quality metrics, and best practice  recommendations. A written personalized care plan for preventive services as well as general preventive health recommendations were provided to patient.     Kavin Leech, [place]   12/31/2020   Nurse Notes: Non face to face time 20 minutes.   Ms. Nachtigal , Thank you for taking time to come for your Medicare Wellness Visit. I appreciate your ongoing commitment to your health goals. Please review the following plan we discussed and let me know if I can assist you in the future.   These are the goals we discussed:  Goals      Exercise      Starting in spring 2018, I will start exercising at least 3 days a week.        This is a list of the screening recommended for you and due dates:  Health Maintenance  Topic Date Due   Zoster (Shingles) Vaccine (1 of 2) Never done   Flu Shot  11/01/2020   COVID-19 Vaccine (4 - Booster for Pfizer series) 01/19/2021   Tetanus Vaccine  05/07/2024   DEXA scan (bone density measurement)  Completed   HPV Vaccine  Aged Out

## 2021-01-04 ENCOUNTER — Other Ambulatory Visit: Payer: Self-pay | Admitting: Family Medicine

## 2021-01-04 DIAGNOSIS — J309 Allergic rhinitis, unspecified: Secondary | ICD-10-CM

## 2021-01-10 ENCOUNTER — Inpatient Hospital Stay: Payer: Medicare PPO | Attending: Hematology & Oncology

## 2021-01-10 ENCOUNTER — Telehealth: Payer: Self-pay | Admitting: *Deleted

## 2021-01-10 ENCOUNTER — Encounter: Payer: Self-pay | Admitting: Hematology & Oncology

## 2021-01-10 ENCOUNTER — Encounter: Payer: Self-pay | Admitting: *Deleted

## 2021-01-10 ENCOUNTER — Inpatient Hospital Stay (HOSPITAL_BASED_OUTPATIENT_CLINIC_OR_DEPARTMENT_OTHER): Payer: Medicare PPO | Admitting: Hematology & Oncology

## 2021-01-10 ENCOUNTER — Other Ambulatory Visit: Payer: Self-pay

## 2021-01-10 VITALS — BP 144/74 | HR 97 | Temp 98.9°F | Resp 18 | Wt 252.0 lb

## 2021-01-10 DIAGNOSIS — Z853 Personal history of malignant neoplasm of breast: Secondary | ICD-10-CM

## 2021-01-10 DIAGNOSIS — Z170 Estrogen receptor positive status [ER+]: Secondary | ICD-10-CM | POA: Insufficient documentation

## 2021-01-10 DIAGNOSIS — C50911 Malignant neoplasm of unspecified site of right female breast: Secondary | ICD-10-CM

## 2021-01-10 DIAGNOSIS — Z79811 Long term (current) use of aromatase inhibitors: Secondary | ICD-10-CM | POA: Insufficient documentation

## 2021-01-10 LAB — CMP (CANCER CENTER ONLY)
ALT: 12 U/L (ref 0–44)
AST: 14 U/L — ABNORMAL LOW (ref 15–41)
Albumin: 3.9 g/dL (ref 3.5–5.0)
Alkaline Phosphatase: 61 U/L (ref 38–126)
Anion gap: 10 (ref 5–15)
BUN: 25 mg/dL — ABNORMAL HIGH (ref 8–23)
CO2: 25 mmol/L (ref 22–32)
Calcium: 10 mg/dL (ref 8.9–10.3)
Chloride: 104 mmol/L (ref 98–111)
Creatinine: 1.1 mg/dL — ABNORMAL HIGH (ref 0.44–1.00)
GFR, Estimated: 50 mL/min — ABNORMAL LOW (ref >60)
Glucose, Bld: 97 mg/dL (ref 70–99)
Potassium: 3.9 mmol/L (ref 3.5–5.1)
Sodium: 139 mmol/L (ref 135–145)
Total Bilirubin: 0.5 mg/dL (ref 0.3–1.2)
Total Protein: 6.9 g/dL (ref 6.5–8.1)

## 2021-01-10 LAB — CBC WITH DIFFERENTIAL (CANCER CENTER ONLY)
Abs Immature Granulocytes: 0.01 10*3/uL (ref 0.00–0.07)
Basophils Absolute: 0.1 10*3/uL (ref 0.0–0.1)
Basophils Relative: 1 %
Eosinophils Absolute: 0.2 10*3/uL (ref 0.0–0.5)
Eosinophils Relative: 2 %
HCT: 41.7 % (ref 36.0–46.0)
Hemoglobin: 13.6 g/dL (ref 12.0–15.0)
Immature Granulocytes: 0 %
Lymphocytes Relative: 25 %
Lymphs Abs: 1.7 10*3/uL (ref 0.7–4.0)
MCH: 30.4 pg (ref 26.0–34.0)
MCHC: 32.6 g/dL (ref 30.0–36.0)
MCV: 93.1 fL (ref 80.0–100.0)
Monocytes Absolute: 0.8 10*3/uL (ref 0.1–1.0)
Monocytes Relative: 12 %
Neutro Abs: 4 10*3/uL (ref 1.7–7.7)
Neutrophils Relative %: 60 %
Platelet Count: 285 10*3/uL (ref 150–400)
RBC: 4.48 MIL/uL (ref 3.87–5.11)
RDW: 13.1 % (ref 11.5–15.5)
WBC Count: 6.7 10*3/uL (ref 4.0–10.5)
nRBC: 0 % (ref 0.0–0.2)

## 2021-01-10 MED ORDER — LETROZOLE 2.5 MG PO TABS: 2.5000 mg | ORAL_TABLET | Freq: Every day | ORAL | 6 refills | Status: DC

## 2021-01-10 NOTE — Progress Notes (Signed)
Hematology and Oncology Follow Up Visit  Lisa Paul [id number] 10/23/1934 86 y.o. 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  Current Therapy:   Lobectomy on 12/27/2020 Femara 2.5 mg p.o. daily-start on 01/10/2021     Interim History:  Ms. Lisa Paul is back for follow-up.  This is her second office visit.  We first saw her on 10/27/2020.  She 5 did have surgery.  This was done by Dr. Adair Laundry on 12/16/2020.  The pathology report ([id number]) showed a 1 cm invasive ductal carcinoma.  All margins were negative.  There is some focal high-grade ductal carcinoma in situ.  There was no lymphovascular space invasion.  No lymph nodes were removed.  As such, she is a stage IA (T1bN0M0) infiltrating ductal carcinoma.  Given her age and the very small size of the tumor, I really do not think that there is any role for anti-HER2 therapy.  Also do not think there is any role for radiation therapy.  I think with the small size of the tumor, and her age, I really do not think that a Oncotype assay is going to help Lisa Paul out.  Do believe that Femara would be reasonable for her.  She looks great.  She really has a small surgical incision.  She really did not take any pain medication.  She has had no problems with cough or shortness of breath.  Has been no nausea or vomiting.  She has had no change in bowel or bladder habits.  She has had no bony pain.  She has had no headache.  Overall, I would say her performance status is ECOG 1.  Medications:  Current Outpatient Medications:    fluticasone (FLONASE) 50 MCG/ACT nasal spray, Place 2 sprays into both nostrils daily., Disp: , Rfl:    letrozole (FEMARA) 2.5 MG tablet, Take 1 tablet (2.5 mg total) by mouth daily., Disp: 30 tablet, Rfl: 6   acetaminophen (TYLENOL) 500 MG tablet, Take 2 tablets (1,000 mg total) by mouth every 8 (eight) hours as needed. (Patient taking differently:  Take 500 mg by mouth every 8 (eight) hours as needed (for pain. (with tramadol)). ), Disp: 30 tablet, Rfl: 0   amoxicillin (AMOXIL) 500 MG tablet, Take 1 tablet (500 mg total) by mouth as directed. 4 tablets prior to dental procedures, Disp: 4 tablet, Rfl: 2   COVID-19 mRNA Vac-TriS, Pfizer, SUSP injection, Inject into the muscle., Disp: 0.3 mL, Rfl: 0   Cyanocobalamin (B-12) 1000 MCG TABS, Take 1 tablet by mouth daily. (Patient taking differently: Take 1,000 mcg by mouth daily. ), Disp: 30 tablet, Rfl:    diphenhydrAMINE (BENADRYL) 25 mg capsule, Take 25 mg by mouth every 6 (six) hours as needed. Equate brand - taking @@ night for runny eyes, Disp: , Rfl:    erythromycin ophthalmic ointment, Place 1 application into both eyes 2 (two) times daily., Disp: 3.5 g, Rfl: 0   famotidine (PEPCID) 20 MG tablet, TAKE 1 TABLET BY MOUTH EVERY DAY IN THE MORNING, Disp: 30 tablet, Rfl: 2   guaiFENesin (MUCINEX) 600 MG 12 hr tablet, Take 600 mg by mouth 2 (two) times daily. As needed for runny eyes, Disp: , Rfl:    hydrochlorothiazide (HYDRODIURIL) 25 MG tablet, TAKE 1 TABLET BY MOUTH EVERY DAY, Disp: 90 tablet, Rfl: 1   losartan-hydrochlorothiazide (HYZAAR) 100-25 MG tablet, TAKE 1 TABLET BY MOUTH EVERY DAY, Disp: 90 tablet, Rfl: 3   multivitamin (VIT W/EXTRA C)  CHEW chewable tablet, Chew by mouth., Disp: , Rfl:    MYRBETRIQ 50 MG TB24 tablet, TAKE 1 TABLET BY MOUTH EVERY DAY, Disp: 90 tablet, Rfl: 2   Polyethyl Glycol-Propyl Glycol (LUBRICANT EYE DROPS) 0.4-0.3 % SOLN, Place 1 drop into both eyes 2 (two) times daily as needed (for dry eyes.)., Disp: , Rfl:   Allergies:  Allergies  Allergen Reactions   Tape Other (See Comments)    Paper tape - blisters Use Paper tape    Past Medical History, Surgical history, Social history, and Family History were reviewed and updated.  Review of Systems: Review of Systems  Constitutional: Negative.   HENT:  Negative.    Eyes: Negative.   Respiratory: Negative.     Cardiovascular: Negative.   Gastrointestinal: Negative.   Endocrine: Negative.   Genitourinary: Negative.    Musculoskeletal: Negative.   Skin: Negative.   Neurological: Negative.   Hematological: Negative.   Psychiatric/Behavioral: Negative.     Physical Exam:  weight is 252 lb (114.3 kg). Her oral temperature is 98.9 F (37.2 C). Her blood pressure is 144/74 (abnormal) and her pulse is 97. Her respiration is 18 and oxygen saturation is 95%.   Wt Readings from Last 3 Encounters:  01/10/21 252 lb (114.3 kg)  10/27/20 254 lb (115.2 kg)  12/11/19 258 lb (117 kg)    Physical Exam Vitals reviewed.  Constitutional:      Comments: Her breast exam show left breast with no masses, edema or erythema.  There is no left axillary adenopathy.  Right breast shows the healed lumpectomy at about the 8 o'clock position.  There is no erythema at the lumpectomy site.  There may be a little bit of firmness.  There is no nipple discharge.  There is no right axillary adenopathy.  HENT:     Head: Normocephalic and atraumatic.  Eyes:     Pupils: Pupils are equal, round, and reactive to light.  Cardiovascular:     Rate and Rhythm: Normal rate and regular rhythm.     Heart sounds: Normal heart sounds.  Pulmonary:     Effort: Pulmonary effort is normal.     Breath sounds: Normal breath sounds.  Abdominal:     General: Bowel sounds are normal.     Palpations: Abdomen is soft.  Musculoskeletal:        General: No tenderness or deformity. Normal range of motion.     Cervical back: Normal range of motion.  Lymphadenopathy:     Cervical: No cervical adenopathy.  Skin:    General: Skin is warm and dry.     Findings: No erythema or rash.  Neurological:     Mental Status: She is alert and oriented to person, place, and time.  Psychiatric:        Behavior: Behavior normal.        Thought Content: Thought content normal.        Judgment: Judgment normal.     Lab Results  Component Value Date    WBC 6.7 01/10/2021   HGB 13.6 01/10/2021   HCT 41.7 01/10/2021   MCV 93.1 01/10/2021   PLT 285 01/10/2021     Chemistry      Component Value Date/Time   NA 139 01/10/2021 0933   NA 139 12/11/2019 1040   K 3.9 01/10/2021 0933   CL 104 01/10/2021 0933   CO2 25 01/10/2021 0933   BUN 25 (H) 01/10/2021 0933   BUN 21 12/11/2019 1040   CREATININE 1.10 (  H) 01/10/2021 0933      Component Value Date/Time   CALCIUM 10.0 01/10/2021 0933   ALKPHOS 61 01/10/2021 0933   AST 14 (L) 01/10/2021 0933   ALT 12 01/10/2021 0933   BILITOT 0.5 01/10/2021 0933       Impression and Plan: Ms. Budreau is a very charming 86 year old white female.  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  This probably was about 9 years ago.  She had a lumpectomy for this.  She did not recall any adjuvant therapy.  She now has a new primary in the right breast.  She underwent a lumpectomy.  This was a very small primary.  She does not need any radiation therapy.  She does not need any chemotherapy or anti-HER2 therapy.  I just think that Femara would be reasonable for her.  She is on vitamin D.  I told her that she needs to take 2000 units of vitamin D daily to try to help with any side effects.  I am just happy that she is doing well.  I think that her chance of cure with surgery and Femara should be over 90%.  I will like to see her back probably in about 2 months.  If all looks good in 2 months, then we will see about moving her appointments out every 3 months for the first year and then every 4 months the second year.   [name], MD 10/10/20225:27 PM

## 2021-01-10 NOTE — Progress Notes (Signed)
Patient here for follow up appointment after her surgery. Due to age, and low risk surgical path, will proceed with Femara alone.   Oncology Nurse Navigator Documentation  Oncology Nurse Navigator Flowsheets 01/10/2021  Abnormal Finding Date -  Confirmed Diagnosis Date -  Diagnosis Status -  Phase of Treatment Hormonal Therapy  Hormonal Actual Start Date: 01/10/2021  Expected Surgery Date -  Surgery Actual Start Date: -  Navigator Follow Up Date: 03/10/2021  Navigator Follow Up Reason: Follow-up Appointment  Navigator Location [facility]  Referral Date to RadOnc/MedOnc -  Navigator Encounter Type Follow-up Appt  Telephone -  Treatment Initiated Date -  Patient Visit Type MedOnc  Treatment Phase Active Tx  Barriers/Navigation Needs Coordination of Care  Education -  Interventions None Required  Acuity Level 2-Minimal Needs (1-2 Barriers Identified)  Referrals -  Coordination of Care -  Education Method -  Support Groups/Services Friends and Family  Time Spent with Patient 15

## 2021-01-10 NOTE — Telephone Encounter (Signed)
Per 01/10/21 los - gave upcoming appointments - confirmed - print calendar

## 2021-02-01 ENCOUNTER — Other Ambulatory Visit: Payer: Self-pay | Admitting: Family Medicine

## 2021-02-01 DIAGNOSIS — J309 Allergic rhinitis, unspecified: Secondary | ICD-10-CM

## 2021-02-01 NOTE — Telephone Encounter (Signed)
Requested medication (s) are due for refill today - yes  Requested medication (s) are on the active medication list -yes  Future visit scheduled -yes  Last refill: 09/28/20  Notes to clinic: Request RF: historical provider  Requested Prescriptions  Pending Prescriptions Disp Refills   fluticasone (FLONASE) 50 MCG/ACT nasal spray [Pharmacy Med Name: FLUTICASONE PROP 50 MCG SPRAY] 16 mL     Sig: SPRAY 2 SPRAYS INTO EACH NOSTRIL EVERY DAY     Ear, Nose, and Throat: Nasal Preparations - Corticosteroids Passed - 02/01/2021  1:33 PM      Passed - Valid encounter within last 12 months    Recent Outpatient Visits           1 year ago Annual physical exam   [facility] Jerrol Banana., MD   1 year ago Essential hypertension   [facility] Jerrol Banana., MD   3 years ago Preoperative clearance   [facility] Jerrol Banana., MD   3 years ago Depression, unspecified depression type   [facility] Jerrol Banana., MD   3 years ago Primary osteoarthritis involving multiple joints   [facility] Jerrol Banana., MD       Future Appointments             In 3 weeks Jerrol Banana., MD [facility]               Requested Prescriptions  Pending Prescriptions Disp Refills   fluticasone (FLONASE) 50 MCG/ACT nasal spray [Pharmacy Med Name: FLUTICASONE PROP 50 MCG SPRAY] 16 mL     Sig: SPRAY 2 SPRAYS INTO EACH NOSTRIL EVERY DAY     Ear, Nose, and Throat: Nasal Preparations - Corticosteroids Passed - 02/01/2021  1:33 PM      Passed - Valid encounter within last 12 months    Recent Outpatient Visits           1 year ago Annual physical exam   [facility] Jerrol Banana., MD   1 year ago Essential hypertension   [facility] Jerrol Banana., MD   3 years ago Preoperative clearance   [facility] Jerrol Banana., MD   3 years ago Depression, unspecified depression type   [facility] Jerrol Banana., MD   3 years ago Primary osteoarthritis involving multiple joints   [facility] Jerrol Banana., MD       Future Appointments             In 3 weeks Jerrol Banana., MD [facility]

## 2021-02-23 ENCOUNTER — Ambulatory Visit (INDEPENDENT_AMBULATORY_CARE_PROVIDER_SITE_OTHER): Payer: Medicare PPO | Admitting: Family Medicine

## 2021-02-23 ENCOUNTER — Encounter: Payer: Self-pay | Admitting: Family Medicine

## 2021-02-23 ENCOUNTER — Other Ambulatory Visit: Payer: Self-pay

## 2021-02-23 VITALS — BP 131/74 | HR 92 | Temp 97.8°F | Ht 66.0 in | Wt 249.0 lb

## 2021-02-23 DIAGNOSIS — R2681 Unsteadiness on feet: Secondary | ICD-10-CM

## 2021-02-23 DIAGNOSIS — J309 Allergic rhinitis, unspecified: Secondary | ICD-10-CM

## 2021-02-23 DIAGNOSIS — Z23 Encounter for immunization: Secondary | ICD-10-CM | POA: Diagnosis not present

## 2021-02-23 DIAGNOSIS — Z0000 Encounter for general adult medical examination without abnormal findings: Secondary | ICD-10-CM

## 2021-02-23 DIAGNOSIS — I10 Essential (primary) hypertension: Secondary | ICD-10-CM | POA: Diagnosis not present

## 2021-02-23 DIAGNOSIS — K219 Gastro-esophageal reflux disease without esophagitis: Secondary | ICD-10-CM | POA: Diagnosis not present

## 2021-02-23 DIAGNOSIS — H109 Unspecified conjunctivitis: Secondary | ICD-10-CM

## 2021-02-23 DIAGNOSIS — E538 Deficiency of other specified B group vitamins: Secondary | ICD-10-CM

## 2021-02-23 DIAGNOSIS — N3281 Overactive bladder: Secondary | ICD-10-CM

## 2021-02-23 DIAGNOSIS — C50919 Malignant neoplasm of unspecified site of unspecified female breast: Secondary | ICD-10-CM

## 2021-02-23 DIAGNOSIS — E785 Hyperlipidemia, unspecified: Secondary | ICD-10-CM | POA: Diagnosis not present

## 2021-02-23 MED ORDER — ERYTHROMYCIN 5 MG/GM OP OINT: 1.0000 "application " | TOPICAL_OINTMENT | Freq: Two times a day (BID) | OPHTHALMIC | 0 refills | Status: DC

## 2021-02-23 NOTE — Progress Notes (Signed)
Complete physical exam   Patient: Lisa Paul   DOB: February 02, 1935   86 y.o. Female  MRN: [id number] Visit Date: 02/23/2021  Today's healthcare provider: Wilhemena Durie, MD   Chief Complaint  Patient presents with   Annual Exam   Subjective    Lisa Paul is a 86 y.o. female who presents today for a complete physical exam.  She reports consuming a general diet. The patient does not participate in regular exercise at present. She generally feels well. She reports sleeping fairly well. She does not have additional problems to discuss today.  He is doing well with her surgical lumpectomy  and treatment of her breast cancer  HPI  Overall she feels well.  Her son from [place] is with her today.  He states she is doing great. Stopped her med Myrbetriq on her own and feels well since then.  Having no urinary symptoms. Does complain of discharge from her eyes chronic problem.  A year ago gave her erythromycin gel ophthalmic but it did not seem to make any difference.  Still has some discharge from her eyes but has not seen an eye doctor.  No visual disturbance at all. Patient had AWV with [facility] on 12/31/2020.  Past Medical History:  Diagnosis Date   Allergy    Anxiety    Arthritis 1993   Cancer ([place]) 2012   Left Breast with lumpectomy and mammosite   Cataract    Colon polyp 2005   Dyspnea    GERD (gastroesophageal reflux disease)    Goals of care, counseling/discussion 10/29/2020   Hypertension 15 years   Incontinence of urine    Lower extremity edema    Malignant neoplasm of breast (female), unspecified site    left breast   Neuropathy    Personal history of radiation therapy 2012   mammosite   Sleep apnea    Stage 1 breast cancer, ER+, right ([place]) 10/29/2020   Past Surgical History:  Procedure Laterality Date   ABDOMINAL HYSTERECTOMY  30 years ago   BREAST BIOPSY Left 2012   invasive mammary carcinoma   BREAST BIOPSY Right 10/19/2020   US biopsy/ path pending    BREAST LUMPECTOMY Left 2012   CARPAL TUNNEL RELEASE Bilateral    COLONOSCOPY  05/2003   Dr Nicolasa [facility]   COLONOSCOPY  07/24/2012   EYE SURGERY Bilateral 2005   cataract   JOINT REPLACEMENT Bilateral 2007   knees   mammosite balloon placement Left 08/2010   Removal 09/2010   TONSILLECTOMY  age 61   TOTAL HIP ARTHROPLASTY Right 10/03/2017   Procedure: [place];  Surgeon: Lovell Sheehan, MD;  Location: [facility] ORS;  Service: Orthopedics;  Laterality: Right;   Social History   Socioeconomic History   Marital status: Widowed    Spouse name: Not on file   Number of children: 2   Years of education: Not on file   Highest education level: Master's degree (e.g., MA, MS, MEng, MEd, MSW, MBA)  Occupational History   Occupation: retired  Tobacco Use   Smoking status: Never   Smokeless tobacco: Never  Vaping Use   Vaping Use: Never used  Substance and Sexual Activity   Alcohol use: No   Drug use: No   Sexual activity: Never  Other Topics Concern   Not on file  Social History Narrative   Not on file   Social Determinants of Health   Financial Resource Strain: Low Risk  Difficulty of Paying Living Expenses: Not hard at all  Food Insecurity: No Food Insecurity   Worried About [place] in the Last Year: Never true   Ran Out of Food in the Last Year: Never true  Transportation Needs: No Transportation Needs   Lack of Transportation (Medical): No   Lack of Transportation (Non-Medical): No  Physical Activity: Insufficiently Active   Days of Exercise per Week: 3 days   Minutes of Exercise per Session: 20 min  Stress: No Stress Concern Present   Feeling of Stress : Only a little  Social Connections: Moderately Integrated   Frequency of Communication with Friends and Family: More than three times a week   Frequency of Social Gatherings with Friends and Family: More than three times a week   Attends Religious Services: More than 4 times  per year   Active Member of [employer] or Organizations: Yes   Attends Archivist Meetings: More than 4 times per year   Marital Status: Widowed  Human resources officer Violence: Not At Risk   Fear of Current or Ex-Partner: No   Emotionally Abused: No   Physically Abused: No   Sexually Abused: No   Family Status  Relation Name Status   Mother  Deceased   Father  Deceased       unknown causes.   Brother  Alive   Brother  Deceased   Other Husband Deceased       paraplegic after stretching esophagus   Mat Aunt  (Not Specified)   Neg Hx  (Not Specified)   Family History  Problem Relation Age of Onset   Colon cancer Mother    Stroke Brother        possibly. Pt is unsure.   Breast cancer Maternal Aunt    Prostate cancer Neg Hx    Bladder Cancer Neg Hx    Kidney cancer Neg Hx    Allergies  Allergen Reactions   Tape Other (See Comments)    Paper tape - blisters Use Paper tape    Patient Care Team: Jerrol Banana., MD as PCP - General (Unknown Physician Specialty) Estill Cotta, MD as Consulting Physician (Ophthalmology) Clyde Canterbury, MD as Referring Physician (Otolaryngology) Ralene Bathe, MD as Consulting Physician (Dermatology) [name], MD as Consulting Physician (Oncology) Cordelia Poche, RN as Oncology Nurse Navigator   Medications: Outpatient Medications Prior to Visit  Medication Sig   acetaminophen (TYLENOL) 500 MG tablet Take 2 tablets (1,000 mg total) by mouth every 8 (eight) hours as needed. (Patient taking differently: Take 500 mg by mouth every 8 (eight) hours as needed (for pain. (with tramadol)).)   amoxicillin (AMOXIL) 500 MG tablet Take 1 tablet (500 mg total) by mouth as directed. 4 tablets prior to dental procedures   COVID-19 mRNA Vac-TriS, Pfizer, SUSP injection Inject into the muscle.   Cyanocobalamin (B-12) 1000 MCG TABS Take 1 tablet by mouth daily. (Patient taking differently: Take 1,000 mcg by mouth daily.)    diphenhydrAMINE (BENADRYL) 25 mg capsule Take 25 mg by mouth every 6 (six) hours as needed. Equate brand - taking @@ night for runny eyes   famotidine (PEPCID) 20 MG tablet TAKE 1 TABLET BY MOUTH EVERY DAY IN THE MORNING   fluticasone (FLONASE) 50 MCG/ACT nasal spray SPRAY 2 SPRAYS INTO EACH NOSTRIL EVERY DAY   guaiFENesin (MUCINEX) 600 MG 12 hr tablet Take 600 mg by mouth 2 (two) times daily. As needed for runny eyes   letrozole (  FEMARA) 2.5 MG tablet Take 1 tablet (2.5 mg total) by mouth daily.   losartan-hydrochlorothiazide (HYZAAR) 100-25 MG tablet TAKE 1 TABLET BY MOUTH EVERY DAY   multivitamin (VIT W/EXTRA C) CHEW chewable tablet Chew by mouth.   MYRBETRIQ 50 MG TB24 tablet TAKE 1 TABLET BY MOUTH EVERY DAY   Polyethyl Glycol-Propyl Glycol 0.4-0.3 % SOLN Place 1 drop into both eyes 2 (two) times daily as needed (for dry eyes.).   [DISCONTINUED] erythromycin ophthalmic ointment Place 1 application into both eyes 2 (two) times daily.   hydrochlorothiazide (HYDRODIURIL) 25 MG tablet TAKE 1 TABLET BY MOUTH EVERY DAY (Patient not taking: Reported on 02/23/2021)   No facility-administered medications prior to visit.    Review of Systems     Objective    BP 131/74 (BP Location: Right Arm, Patient Position: Sitting, Cuff Size: Normal)   Pulse 92   Temp 97.8 F (36.6 C) (Oral)   Ht 5\' 6"  (1.676 m)   Wt 249 lb (112.9 kg)   SpO2 90%   BMI 40.19 kg/m  BP Readings from Last 3 Encounters:  02/23/21 131/74  01/10/21 (!) 144/74  10/27/20 (!) 143/77   Wt Readings from Last 3 Encounters:  02/23/21 249 lb (112.9 kg)  01/10/21 252 lb (114.3 kg)  10/27/20 254 lb (115.2 kg)      Physical Exam Vitals reviewed.  Constitutional:      Appearance: She is well-developed.     Comments: Obese WF NAD.  HENT:     Head: Normocephalic and atraumatic.     Right Ear: External ear normal.     Left Ear: External ear normal.     Nose: Nose normal.  Eyes:     General: No scleral icterus.     Conjunctiva/sclera: Conjunctivae normal.  Neck:     Thyroid: No thyromegaly.  Cardiovascular:     Rate and Rhythm: Normal rate and regular rhythm.     Heart sounds: Normal heart sounds.  Pulmonary:     Effort: Pulmonary effort is normal.     Breath sounds: Normal breath sounds.  Abdominal:     Palpations: Abdomen is soft.  Musculoskeletal:     Comments: Bilateral lower extremity edema--trace.  Skin:    General: Skin is warm and dry.     Comments: Hair thinning on top.  Neurological:     General: No focal deficit present.     Mental Status: She is alert and oriented to person, place, and time.  Psychiatric:        Mood and Affect: Mood normal.        Behavior: Behavior normal.        Thought Content: Thought content normal.      Last depression screening scores PHQ 2/9 Scores 02/23/2021 12/31/2020 12/11/2019  PHQ - 2 Score 0 0 2  PHQ- 9 Score 1 - 3   Last fall risk screening Fall Risk  02/23/2021  Falls in the past year? 0  Number falls in past yr: 0  Injury with Fall? 0  Risk for fall due to : No Fall Risks;Impaired balance/gait  Follow up -   Last Audit-C alcohol use screening Alcohol Use Disorder Test (AUDIT) 02/23/2021  1. How often do you have a drink containing alcohol? 0  2. How many drinks containing alcohol do you have on a typical day when you are drinking? -  3. How often do you have six or more drinks on one occasion? 0  AUDIT-C Score -  Alcohol  Brief Interventions/Follow-up -   A score of 3 or more in women, and 4 or more in men indicates increased risk for alcohol abuse, EXCEPT if all of the points are from question 1   No results found for any visits on 02/23/21.  Assessment & Plan    Routine Health Maintenance and Physical Exam  Exercise Activities and Dietary recommendations  Goals      Exercise      Starting in spring 2018, I will start exercising at least 3 days a week.        Immunization History  Administered Date(s) Administered    Fluad Quad(high Dose 65+) 01/14/2019, 12/11/2019, 02/23/2021   Influenza, High Dose Seasonal PF 02/18/2015, 04/28/2016, 01/24/2017, 01/23/2018   Influenza-Unspecified 01/01/2013   PFIZER Comirnaty(Gray Top)Covid-19 Tri-Sucrose Vaccine 10/27/2020   PFIZER(Purple Top)SARS-COV-2 Vaccination 05/15/2019, 03/12/2020   Pneumococcal Conjugate-13 05/07/2014   Pneumococcal Polysaccharide-23 02/15/1998, 07/11/2011   Td 03/12/2003   Tdap 05/07/2014   Zoster, Live 06/08/2007    Health Maintenance  Topic Date Due   Zoster Vaccines- Shingrix (1 of 2) Never done   COVID-19 Vaccine (4 - Booster for Pfizer series) 12/22/2020   TETANUS/TDAP  05/07/2024   Pneumonia Vaccine 42+ Years old  Completed   INFLUENZA VACCINE  Completed   DEXA SCAN  Completed   HPV VACCINES  Aged Out    Discussed health benefits of physical activity, and encouraged her to engage in regular exercise appropriate for her age and condition.  1. Annual physical exam  - TSH - CBC w/Diff/Platelet - Renal function panel  2. Essential hypertension  - TSH - CBC w/Diff/Platelet - Renal function panel  3. Hyperlipidemia, unspecified hyperlipidemia type  - TSH - CBC w/Diff/Platelet - Renal function panel  4. Obesity, morbid, BMI 40.0-49.9 (HCC)  - TSH - CBC w/Diff/Platelet - Renal function panel  5. Gastroesophageal reflux disease, unspecified whether esophagitis present  - TSH - CBC w/Diff/Platelet - Renal function panel  6. Allergic rhinitis, unspecified seasonality, unspecified trigger  - TSH - CBC w/Diff/Platelet - Renal function panel  7. Need for influenza vaccination  - Flu Vaccine QUAD High Dose(Fluad)  8. Conjunctivitis of both eyes, unspecified conjunctivitis type  - erythromycin ophthalmic ointment; Place 1 application into both eyes 2 (two) times daily.  Dispense: 3.5 g; Refill: 0 9.Breast cancer  10.OAB Pt feels fine off of Myrbetriq  Return in about 1 year (around 02/23/2022).     I,  Wilhemena Durie, MD, have reviewed all documentation for this visit. The documentation on 02/24/21 for the exam, diagnosis, procedures, and orders are all accurate and complete.    Link Burgeson Cranford Mon, MD  [facility] [phone] (phone) [phone] (fax)  [place]

## 2021-02-24 LAB — CBC WITH DIFFERENTIAL/PLATELET
Basophils Absolute: 0.1 10*3/uL (ref 0.0–0.2)
Basos: 1 %
EOS (ABSOLUTE): 0.2 10*3/uL (ref 0.0–0.4)
Eos: 2 %
Hematocrit: 42.6 % (ref 34.0–46.6)
Hemoglobin: 14.1 g/dL (ref 11.1–15.9)
Immature Grans (Abs): 0 10*3/uL (ref 0.0–0.1)
Immature Granulocytes: 0 %
Lymphocytes Absolute: 2.5 10*3/uL (ref 0.7–3.1)
Lymphs: 29 %
MCH: 30.3 pg (ref 26.6–33.0)
MCHC: 33.1 g/dL (ref 31.5–35.7)
MCV: 92 fL (ref 79–97)
Monocytes Absolute: 0.9 10*3/uL (ref 0.1–0.9)
Monocytes: 10 %
Neutrophils Absolute: 4.9 10*3/uL (ref 1.4–7.0)
Neutrophils: 58 %
Platelets: 285 10*3/uL (ref 150–450)
RBC: 4.65 x10E6/uL (ref 3.77–5.28)
RDW: 12.5 % (ref 11.7–15.4)
WBC: 8.5 10*3/uL (ref 3.4–10.8)

## 2021-02-24 LAB — RENAL FUNCTION PANEL
Albumin: 4.3 g/dL (ref 3.6–4.6)
BUN/Creatinine Ratio: 15 (ref 12–28)
BUN: 15 mg/dL (ref 8–27)
CO2: 22 mmol/L (ref 20–29)
Calcium: 10 mg/dL (ref 8.7–10.3)
Chloride: 100 mmol/L (ref 96–106)
Creatinine, Ser: 1.03 mg/dL — ABNORMAL HIGH (ref 0.57–1.00)
Glucose: 94 mg/dL (ref 70–99)
Phosphorus: 3.9 mg/dL (ref 3.0–4.3)
Potassium: 4.3 mmol/L (ref 3.5–5.2)
Sodium: 141 mmol/L (ref 134–144)
eGFR: 54 mL/min/{1.73_m2} — ABNORMAL LOW (ref >59)

## 2021-02-24 LAB — TSH: TSH: 1.47 u[IU]/mL (ref 0.450–4.500)

## 2021-02-25 ENCOUNTER — Other Ambulatory Visit: Payer: Self-pay | Admitting: Family Medicine

## 2021-02-25 DIAGNOSIS — J309 Allergic rhinitis, unspecified: Secondary | ICD-10-CM

## 2021-02-27 ENCOUNTER — Other Ambulatory Visit: Payer: Self-pay | Admitting: Family Medicine

## 2021-02-27 DIAGNOSIS — H109 Unspecified conjunctivitis: Secondary | ICD-10-CM

## 2021-02-28 MED ORDER — ERYTHROMYCIN 5 MG/GM OP OINT: 1.0000 "application " | TOPICAL_OINTMENT | Freq: Two times a day (BID) | OPHTHALMIC | 0 refills | Status: DC

## 2021-03-10 ENCOUNTER — Other Ambulatory Visit: Payer: Self-pay

## 2021-03-10 ENCOUNTER — Inpatient Hospital Stay: Payer: Medicare PPO | Attending: Hematology & Oncology

## 2021-03-10 ENCOUNTER — Inpatient Hospital Stay (HOSPITAL_BASED_OUTPATIENT_CLINIC_OR_DEPARTMENT_OTHER): Payer: Medicare PPO | Admitting: Hematology & Oncology

## 2021-03-10 ENCOUNTER — Encounter: Payer: Self-pay | Admitting: Hematology & Oncology

## 2021-03-10 ENCOUNTER — Encounter: Payer: Self-pay | Admitting: *Deleted

## 2021-03-10 VITALS — BP 125/51 | HR 89 | Temp 97.7°F | Resp 20 | Ht 66.0 in | Wt 248.0 lb

## 2021-03-10 DIAGNOSIS — Z170 Estrogen receptor positive status [ER+]: Secondary | ICD-10-CM | POA: Diagnosis not present

## 2021-03-10 DIAGNOSIS — Z79811 Long term (current) use of aromatase inhibitors: Secondary | ICD-10-CM | POA: Diagnosis not present

## 2021-03-10 DIAGNOSIS — C50911 Malignant neoplasm of unspecified site of right female breast: Secondary | ICD-10-CM | POA: Diagnosis present

## 2021-03-10 LAB — CMP (CANCER CENTER ONLY)
ALT: 13 U/L (ref 0–44)
AST: 14 U/L — ABNORMAL LOW (ref 15–41)
Albumin: 4 g/dL (ref 3.5–5.0)
Alkaline Phosphatase: 59 U/L (ref 38–126)
Anion gap: 10 (ref 5–15)
BUN: 20 mg/dL (ref 8–23)
CO2: 27 mmol/L (ref 22–32)
Calcium: 10.3 mg/dL (ref 8.9–10.3)
Chloride: 104 mmol/L (ref 98–111)
Creatinine: 1.08 mg/dL — ABNORMAL HIGH (ref 0.44–1.00)
GFR, Estimated: 51 mL/min — ABNORMAL LOW (ref >60)
Glucose, Bld: 112 mg/dL — ABNORMAL HIGH (ref 70–99)
Potassium: 3.6 mmol/L (ref 3.5–5.1)
Sodium: 141 mmol/L (ref 135–145)
Total Bilirubin: 0.5 mg/dL (ref 0.3–1.2)
Total Protein: 7.1 g/dL (ref 6.5–8.1)

## 2021-03-10 LAB — CBC WITH DIFFERENTIAL (CANCER CENTER ONLY)
Abs Immature Granulocytes: 0.03 10*3/uL (ref 0.00–0.07)
Basophils Absolute: 0.1 10*3/uL (ref 0.0–0.1)
Basophils Relative: 1 %
Eosinophils Absolute: 0.2 10*3/uL (ref 0.0–0.5)
Eosinophils Relative: 3 %
HCT: 41.9 % (ref 36.0–46.0)
Hemoglobin: 13.7 g/dL (ref 12.0–15.0)
Immature Granulocytes: 0 %
Lymphocytes Relative: 28 %
Lymphs Abs: 2.2 10*3/uL (ref 0.7–4.0)
MCH: 30.7 pg (ref 26.0–34.0)
MCHC: 32.7 g/dL (ref 30.0–36.0)
MCV: 93.9 fL (ref 80.0–100.0)
Monocytes Absolute: 0.8 10*3/uL (ref 0.1–1.0)
Monocytes Relative: 10 %
Neutro Abs: 4.6 10*3/uL (ref 1.7–7.7)
Neutrophils Relative %: 58 %
Platelet Count: 281 10*3/uL (ref 150–400)
RBC: 4.46 MIL/uL (ref 3.87–5.11)
RDW: 13.1 % (ref 11.5–15.5)
WBC Count: 8 10*3/uL (ref 4.0–10.5)
nRBC: 0 % (ref 0.0–0.2)

## 2021-03-10 NOTE — Progress Notes (Signed)
Hematology and Oncology Follow Up Visit  Lisa Paul [id number] Mar 03, 1935 86 y.o. 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  Current Therapy:   Lobectomy on 12/27/2020 Femara 2.5 mg p.o. daily-start on 01/10/2021     Interim History:  Lisa Paul is back for follow-up.  This is her second office visit.  We first saw her on 10/27/2020.  She 5 did have surgery.  This was done by Dr. Adair Laundry on 12/16/2020.  The pathology report ([id number]) showed a 1 cm invasive ductal carcinoma.  All margins were negative.  There is some focal high-grade ductal carcinoma in situ.  There was no lymphovascular space invasion.  No lymph nodes were removed.  As such, she is a stage IA (T1bN0M0) infiltrating ductal carcinoma.  Given her age and the very small size of the tumor, I really did not think that there is any role for anti-HER2 therapy.  Also, I did not think there is any role for radiation therapy.  I think with the small size of the tumor, and her age, I really did not think that a Oncotype assay is going to help [place] out.  So far, she is doing well with the Femara.  She is having no problems with bony pain.  There is no problems with nausea or vomiting.  She is having no hot flashes or sweats.  There is no change in bowel or bladder habits.  She has had no leg swelling.  She has had no rashes.    Overall, I would say her performance status is ECOG 1.  Medications:  Current Outpatient Medications:    acetaminophen (TYLENOL) 500 MG tablet, Take 2 tablets (1,000 mg total) by mouth every 8 (eight) hours as needed. (Patient taking differently: Take 500 mg by mouth every 8 (eight) hours as needed (for pain. (with tramadol)).), Disp: 30 tablet, Rfl: 0   amoxicillin (AMOXIL) 500 MG tablet, Take 1 tablet (500 mg total) by mouth as directed. 4 tablets prior to dental procedures, Disp: 4 tablet, Rfl: 2   COVID-19 mRNA Vac-TriS,  Pfizer, SUSP injection, Inject into the muscle., Disp: 0.3 mL, Rfl: 0   Cyanocobalamin (B-12) 1000 MCG TABS, Take 1 tablet by mouth daily. (Patient taking differently: Take 1,000 mcg by mouth daily.), Disp: 30 tablet, Rfl:    diphenhydrAMINE (BENADRYL) 25 mg capsule, Take 25 mg by mouth every 6 (six) hours as needed. Equate brand - taking @@ night for runny eyes, Disp: , Rfl:    erythromycin ophthalmic ointment, Place 1 application into both eyes 2 (two) times daily., Disp: 3.5 g, Rfl: 0   famotidine (PEPCID) 20 MG tablet, TAKE 1 TABLET BY MOUTH EVERY DAY IN THE MORNING, Disp: 30 tablet, Rfl: 2   fluticasone (FLONASE) 50 MCG/ACT nasal spray, SPRAY 2 SPRAYS INTO EACH NOSTRIL EVERY DAY, Disp: 16 mL, Rfl: 5   guaiFENesin (MUCINEX) 600 MG 12 hr tablet, Take 600 mg by mouth 2 (two) times daily. As needed for runny eyes, Disp: , Rfl:    hydrochlorothiazide (HYDRODIURIL) 25 MG tablet, TAKE 1 TABLET BY MOUTH EVERY DAY (Patient not taking: Reported on 02/23/2021), Disp: 90 tablet, Rfl: 1   letrozole (FEMARA) 2.5 MG tablet, Take 1 tablet (2.5 mg total) by mouth daily., Disp: 30 tablet, Rfl: 6   losartan-hydrochlorothiazide (HYZAAR) 100-25 MG tablet, TAKE 1 TABLET BY MOUTH EVERY DAY, Disp: 90 tablet, Rfl: 3   multivitamin (VIT W/EXTRA C) CHEW chewable tablet, Chew by  mouth., Disp: , Rfl:    MYRBETRIQ 50 MG TB24 tablet, TAKE 1 TABLET BY MOUTH EVERY DAY, Disp: 90 tablet, Rfl: 2   Polyethyl Glycol-Propyl Glycol 0.4-0.3 % SOLN, Place 1 drop into both eyes 2 (two) times daily as needed (for dry eyes.)., Disp: , Rfl:   Allergies:  Allergies  Allergen Reactions   Tape Other (See Comments)    Paper tape - blisters Use Paper tape    Past Medical History, Surgical history, Social history, and Family History were reviewed and updated.  Review of Systems: Review of Systems  Constitutional: Negative.   HENT:  Negative.    Eyes: Negative.   Respiratory: Negative.    Cardiovascular: Negative.    Gastrointestinal: Negative.   Endocrine: Negative.   Genitourinary: Negative.    Musculoskeletal: Negative.   Skin: Negative.   Neurological: Negative.   Hematological: Negative.   Psychiatric/Behavioral: Negative.     Physical Exam:  vitals were not taken for this visit.   Wt Readings from Last 3 Encounters:  02/23/21 249 lb (112.9 kg)  01/10/21 252 lb (114.3 kg)  10/27/20 254 lb (115.2 kg)    Physical Exam Vitals reviewed.  Constitutional:      Comments: Her breast exam show left breast with no masses, edema or erythema.  There is no left axillary adenopathy.  Right breast shows the healed lumpectomy at about the 8 o'clock position.  There is no erythema at the lumpectomy site.  There may be a little bit of firmness.  There is no nipple discharge.  There is no right axillary adenopathy.  HENT:     Head: Normocephalic and atraumatic.  Eyes:     Pupils: Pupils are equal, round, and reactive to light.  Cardiovascular:     Rate and Rhythm: Normal rate and regular rhythm.     Heart sounds: Normal heart sounds.  Pulmonary:     Effort: Pulmonary effort is normal.     Breath sounds: Normal breath sounds.  Abdominal:     General: Bowel sounds are normal.     Palpations: Abdomen is soft.  Musculoskeletal:        General: No tenderness or deformity. Normal range of motion.     Cervical back: Normal range of motion.  Lymphadenopathy:     Cervical: No cervical adenopathy.  Skin:    General: Skin is warm and dry.     Findings: No erythema or rash.  Neurological:     Mental Status: She is alert and oriented to person, place, and time.  Psychiatric:        Behavior: Behavior normal.        Thought Content: Thought content normal.        Judgment: Judgment normal.     Lab Results  Component Value Date   WBC 8.0 03/10/2021   HGB 13.7 03/10/2021   HCT 41.9 03/10/2021   MCV 93.9 03/10/2021   PLT 281 03/10/2021     Chemistry      Component Value Date/Time   NA 141  03/10/2021 0933   NA 141 02/23/2021 1507   K 3.6 03/10/2021 0933   CL 104 03/10/2021 0933   CO2 27 03/10/2021 0933   BUN 20 03/10/2021 0933   BUN 15 02/23/2021 1507   CREATININE 1.08 (H) 03/10/2021 0933      Component Value Date/Time   CALCIUM 10.3 03/10/2021 0933   ALKPHOS 59 03/10/2021 0933   AST 14 (L) 03/10/2021 0933   ALT 13 03/10/2021 0933  BILITOT 0.5 03/10/2021 0933       Impression and Plan: Ms. Serratore is a very charming 86 year old white female.  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  This probably was about 9 years ago.  She had a lumpectomy for this.  She did not recall any adjuvant therapy.  She now has a new primary in the right breast.  She underwent a lumpectomy.  This was a very small primary.  She did not need any radiation therapy.  She did not need any chemotherapy or anti-HER2 therapy.  I just think that Femara would be reasonable for her.  She is on vitamin D.  I told her that she needs to take 2000 units of vitamin D daily to try to help with any side effects.  I am just happy that she is doing well.  I think that her chance of cure with surgery and Femara should be over 90%.  I think we now get her back in 4 months.  I would like to get her through the Winter.  I do not want her going back if there is any bad weather.  I know that she will have a wonderful Christmas.  She had a very enjoyable Thanksgiving with her son down in [place].    She is thinking about going to assisted living.  I know this is a tough decision for her.     [name], MD 12/8/202210:31 AM

## 2021-03-11 NOTE — Progress Notes (Signed)
Oncology Nurse Navigator Documentation  Oncology Nurse Navigator Flowsheets 03/10/2021  Abnormal Finding Date -  Confirmed Diagnosis Date -  Diagnosis Status -  Phase of Treatment -  Hormonal Actual Start Date: -  Expected Surgery Date -  Surgery Actual Start Date: -  Navigator Follow Up Date: 07/08/2021  Navigator Follow Up Reason: Follow-up Appointment  Navigator Location [facility]  Referral Date to RadOnc/MedOnc -  Navigator Encounter Type Appt/Treatment Plan Review  Telephone -  Treatment Initiated Date -  Patient Visit Type MedOnc  Treatment Phase Active Tx  Barriers/Navigation Needs No Barriers At This Time  Education -  Interventions None Required  Acuity Level 1-No Barriers  Referrals -  Coordination of Care -  Education Method -  Support Groups/Services Friends and Family  Time Spent with Patient 15

## 2021-04-18 ENCOUNTER — Other Ambulatory Visit: Payer: Self-pay | Admitting: Hematology & Oncology

## 2021-05-02 ENCOUNTER — Telehealth: Payer: Self-pay | Admitting: Family Medicine

## 2021-05-02 NOTE — Telephone Encounter (Signed)
Copied from [place] [id number]. Topic: Medicare AWV >> May 02, 2021  2:17 PM Cher Nakai R wrote: Reason for CRM:  Left message for patient to call back and schedule Medicare Annual Wellness Visit (AWV) in office.   If not able to come in office, please offer to do virtually or by telephone.   Last AWV: : 08/11/2019  Please schedule at anytime with [facility] Health Advisor.  If any questions, please contact me at [phone]

## 2021-07-05 ENCOUNTER — Telehealth: Payer: Self-pay | Admitting: Family

## 2021-07-05 NOTE — Telephone Encounter (Signed)
Patient's son [name] called to confirm appointment, requested office to call mother to confirm with her , called patient and LVM requesting her to call us back  ?

## 2021-07-08 ENCOUNTER — Encounter: Payer: Self-pay | Admitting: Family

## 2021-07-08 ENCOUNTER — Inpatient Hospital Stay (HOSPITAL_BASED_OUTPATIENT_CLINIC_OR_DEPARTMENT_OTHER): Payer: Medicare (Managed Care) | Admitting: Family

## 2021-07-08 ENCOUNTER — Encounter: Payer: Self-pay | Admitting: *Deleted

## 2021-07-08 ENCOUNTER — Inpatient Hospital Stay: Payer: Medicare (Managed Care) | Attending: Hematology & Oncology

## 2021-07-08 VITALS — BP 124/49 | HR 97 | Temp 98.2°F | Resp 22 | Wt 250.0 lb

## 2021-07-08 DIAGNOSIS — Z170 Estrogen receptor positive status [ER+]: Secondary | ICD-10-CM | POA: Diagnosis not present

## 2021-07-08 DIAGNOSIS — Z853 Personal history of malignant neoplasm of breast: Secondary | ICD-10-CM | POA: Diagnosis not present

## 2021-07-08 DIAGNOSIS — C50911 Malignant neoplasm of unspecified site of right female breast: Secondary | ICD-10-CM | POA: Insufficient documentation

## 2021-07-08 DIAGNOSIS — Z79811 Long term (current) use of aromatase inhibitors: Secondary | ICD-10-CM | POA: Insufficient documentation

## 2021-07-08 LAB — CBC WITH DIFFERENTIAL (CANCER CENTER ONLY)
Abs Immature Granulocytes: 0.02 10*3/uL (ref 0.00–0.07)
Basophils Absolute: 0.1 10*3/uL (ref 0.0–0.1)
Basophils Relative: 1 %
Eosinophils Absolute: 0.2 10*3/uL (ref 0.0–0.5)
Eosinophils Relative: 2 %
HCT: 41.8 % (ref 36.0–46.0)
Hemoglobin: 13.9 g/dL (ref 12.0–15.0)
Immature Granulocytes: 0 %
Lymphocytes Relative: 24 %
Lymphs Abs: 2 10*3/uL (ref 0.7–4.0)
MCH: 31 pg (ref 26.0–34.0)
MCHC: 33.3 g/dL (ref 30.0–36.0)
MCV: 93.3 fL (ref 80.0–100.0)
Monocytes Absolute: 0.8 10*3/uL (ref 0.1–1.0)
Monocytes Relative: 9 %
Neutro Abs: 5.2 10*3/uL (ref 1.7–7.7)
Neutrophils Relative %: 64 %
Platelet Count: 270 10*3/uL (ref 150–400)
RBC: 4.48 MIL/uL (ref 3.87–5.11)
RDW: 12.8 % (ref 11.5–15.5)
WBC Count: 8.2 10*3/uL (ref 4.0–10.5)
nRBC: 0 % (ref 0.0–0.2)

## 2021-07-08 LAB — CMP (CANCER CENTER ONLY)
ALT: 13 U/L (ref 0–44)
AST: 14 U/L — ABNORMAL LOW (ref 15–41)
Albumin: 4.2 g/dL (ref 3.5–5.0)
Alkaline Phosphatase: 57 U/L (ref 38–126)
Anion gap: 10 (ref 5–15)
BUN: 18 mg/dL (ref 8–23)
CO2: 24 mmol/L (ref 22–32)
Calcium: 9.9 mg/dL (ref 8.9–10.3)
Chloride: 106 mmol/L (ref 98–111)
Creatinine: 1 mg/dL (ref 0.44–1.00)
GFR, Estimated: 56 mL/min — ABNORMAL LOW (ref >60)
Glucose, Bld: 111 mg/dL — ABNORMAL HIGH (ref 70–99)
Potassium: 3.7 mmol/L (ref 3.5–5.1)
Sodium: 140 mmol/L (ref 135–145)
Total Bilirubin: 0.6 mg/dL (ref 0.3–1.2)
Total Protein: 7.1 g/dL (ref 6.5–8.1)

## 2021-07-08 NOTE — Progress Notes (Signed)
?Hematology and Oncology Follow Up Visit ? ?Lisa Paul ?[id number] ?02/19/1935 86 y.o. ?07/08/2021 ? ? ?Principle Diagnosis:  ?Stage IA (T1bN0M0) ER+/PR+/HER2+ invasive ductal carcinoma of the right breast. ?Remote history of stage I carcinoma of the left breast (lumpectomy only in 2015) ?  ?Current Therapy:        ?Lumpectomy on 12/27/2020 ?Femara 2.5 mg PO daily - started in January 2023 per patient  ?  ?Interim History:  Lisa Paul is here today for follow-up. She is doing fairly well but notes some fatigue and mild SOB with exertion.  ?She is tolerating Femara nicely and states that she is taking daily as prescribed.   ?No hot flashes or night sweat.  ?Bilateral breast exam negative. No mass, lesion or rash noted.  ?No adenopathy or lymphedema noted.  ?No fever, chills, n/v, cough, rash, dizziness, chest pain, palpitations, abdominal pain or changes in bowel or bladder habits.  ?No blood loss noted. No abnormal bruising or petechiae.  ?She notes some mild swelling in her feet and ankles that waxes and wanes.  ?She ambulates with a cane for added support.  ?No falls or syncope reported.  ?She states that she is eating well and staying hydrated throughout the day.  ?Her weight is stable at 250 lbs.  ? ?ECOG Performance Status: 1 - Symptomatic but completely ambulatory ? ?Medications:  ?Allergies as of 07/08/2021   ? ?   Reactions  ? Tape Other (See Comments)  ? Paper tape - blisters ?Use Paper tape  ? ?  ? ?  ?Medication List  ?  ? ?  ? Accurate as of July 08, 2021 10:04 AM. If you have any questions, ask your nurse or doctor.  ?  ?  ? ?  ? ?acetaminophen 500 MG tablet ?Commonly known as: TYLENOL ?Take 2 tablets (1,000 mg total) by mouth every 8 (eight) hours as needed. ?What changed:  ?how much to take ?reasons to take this ?  ?amoxicillin 500 MG tablet ?Commonly known as: AMOXIL ?Take 1 tablet (500 mg total) by mouth as directed. 4 tablets prior to dental procedures ?  ?B-12 1000 MCG Tabs ?Take 1 tablet by mouth  daily. ?  ?diphenhydrAMINE 25 mg capsule ?Commonly known as: BENADRYL ?Take 25 mg by mouth every 6 (six) hours as needed. Equate brand - taking @@ night for runny eyes ?  ?erythromycin ophthalmic ointment ?Place 1 application into both eyes 2 (two) times daily. ?  ?famotidine 20 MG tablet ?Commonly known as: PEPCID ?TAKE 1 TABLET BY MOUTH EVERY DAY IN THE MORNING ?  ?fluticasone 50 MCG/ACT nasal spray ?Commonly known as: FLONASE ?SPRAY 2 SPRAYS INTO EACH NOSTRIL EVERY DAY ?  ?guaiFENesin 600 MG 12 hr tablet ?Commonly known as: MUCINEX ?Take 600 mg by mouth 2 (two) times daily. As needed for runny eyes ?  ?hydrochlorothiazide 25 MG tablet ?Commonly known as: HYDRODIURIL ?TAKE 1 TABLET BY MOUTH EVERY DAY ?  ?letrozole 2.5 MG tablet ?Commonly known as: FEMARA ?TAKE 1 TABLET BY MOUTH EVERY DAY ?  ?losartan-hydrochlorothiazide 100-25 MG tablet ?Commonly known as: HYZAAR ?TAKE 1 TABLET BY MOUTH EVERY DAY ?  ?multivitamin Chew chewable tablet ?Chew by mouth. ?  ?Myrbetriq 50 MG Tb24 tablet ?Generic drug: mirabegron ER ?TAKE 1 TABLET BY MOUTH EVERY DAY ?  ?Pfizer-BioNT COVID-19 Vac-TriS Susp injection ?Generic drug: COVID-19 mRNA Vac-TriS (Pfizer) ?Inject into the muscle. ?  ?Polyethyl Glycol-Propyl Glycol 0.4-0.3 % Soln ?Place 1 drop into both eyes 2 (two) times daily as needed (  for dry eyes.). ?  ? ?  ? ? ?Allergies:  ?Allergies  ?Allergen Reactions  ? Tape Other (See Comments)  ?  Paper tape - blisters ?Use Paper tape  ? ? ?Past Medical History, Surgical history, Social history, and Family History were reviewed and updated. ? ?Review of Systems: ?All other 10 point review of systems is negative.  ? ?Physical Exam: ? vitals were not taken for this visit.  ? ?Wt Readings from Last 3 Encounters:  ?03/10/21 248 lb (112.5 kg)  ?02/23/21 249 lb (112.9 kg)  ?01/10/21 252 lb (114.3 kg)  ? ? ?Ocular: Sclerae unicteric, pupils equal, round and reactive to light ?Ear-nose-throat: Oropharynx clear, dentition fair ?Lymphatic: No  cervical, supraclavicular or axillary adenopathy ?Lungs no rales or rhonchi, good excursion bilaterally ?Heart regular rate and rhythm, no murmur appreciated ?Abd soft, nontender, positive bowel sounds ?MSK no focal spinal tenderness, no joint edema ?Neuro: non-focal, well-oriented, appropriate affect ?Breasts: Bilateral breast exam today was negative. No mass, lesion or rash noted.  ? ?Lab Results  ?Component Value Date  ? WBC 8.2 07/08/2021  ? HGB 13.9 07/08/2021  ? HCT 41.8 07/08/2021  ? MCV 93.3 07/08/2021  ? PLT 270 07/08/2021  ? ?No results found for: FERRITIN, IRON, TIBC, UIBC, IRONPCTSAT ?Lab Results  ?Component Value Date  ? RBC 4.48 07/08/2021  ? ?No results found for: KPAFRELGTCHN, LAMBDASER, KAPLAMBRATIO ?No results found for: IGGSERUM, IGA, IGMSERUM ?No results found for: TOTALPROTELP, ALBUMINELP, A1GS, A2GS, BETS, BETA2SER, GAMS, MSPIKE, SPEI ?  Chemistry   ?   ?Component Value Date/Time  ? NA 141 03/10/2021 0933  ? NA 141 02/23/2021 1507  ? K 3.6 03/10/2021 0933  ? CL 104 03/10/2021 0933  ? CO2 27 03/10/2021 0933  ? BUN 20 03/10/2021 0933  ? BUN 15 02/23/2021 1507  ? CREATININE 1.08 (H) 03/10/2021 0933  ?    ?Component Value Date/Time  ? CALCIUM 10.3 03/10/2021 0933  ? ALKPHOS 59 03/10/2021 0933  ? AST 14 (L) 03/10/2021 0933  ? ALT 13 03/10/2021 0933  ? BILITOT 0.5 03/10/2021 0933  ?  ? ? ? ?Impression and Plan: Lisa Paul is a very pleasant 86 yo caucasian female with remote history of stage I carcinoma of the left breast treated with lumpectomy.  ?She now has a new primary of the right breast stage IA (T1bN0M0) ER+/PR+/HER2+ invasive ductal carcinoma with lumpectomy in September 2022.  ?She is now on Femara (started in January 2023) ?We will plan to repeat her mammogram in July. Order in.  ?Follow-up in 4 months.  ? ? Carter, NP ?4/7/202310:04 AM ? ?

## 2021-07-12 ENCOUNTER — Telehealth: Payer: Self-pay | Admitting: *Deleted

## 2021-07-12 NOTE — Telephone Encounter (Signed)
Per 07/08/21 los - called and lvm of upcoming appointments - requested call back to confirm ?

## 2021-07-13 NOTE — Progress Notes (Signed)
Oncology Nurse Navigator Documentation ? ? ?  07/08/2021  ? 10:15 AM  ?Oncology Nurse Navigator Flowsheets  ?Navigator Follow Up Date: 11/07/2021  ?Navigator Follow Up Reason: Follow-up Appointment  ?Navigator Location [facility]  ?Navigator Encounter Type Appt/Treatment Plan Review  ?Patient Visit Type MedOnc  ?Treatment Phase Active Tx  ?Barriers/Navigation Needs No Barriers At This Time  ?Interventions None Required  ?Acuity Level 1-No Barriers  ?Support Groups/Services Friends and Family  ?Time Spent with Patient 15  ?  ?

## 2021-07-20 ENCOUNTER — Telehealth: Payer: Self-pay | Admitting: Family Medicine

## 2021-07-20 NOTE — Telephone Encounter (Signed)
Copied from [place] [phone]. Topic: Medicare AWV ?>> Jul 20, 2021 11:12 AM Cher Nakai R wrote: ?Reason for CRM:  ?No answer unable to leave a message for patient to call back and schedule Medicare Annual Wellness Visit (AWV) in office.  ? ?If unable to come into the office for AWV,  please offer to do virtually or by telephone. ? ?Last AWV: 08/11/2019 ? ?Please schedule at anytime with [facility] Health Advisor. ? ?30 minute appointment for Virtual or phone ?45 minute appointment for in office or Initial virtual/phone ? ?Any questions, please contact me at [phone] ?

## 2021-08-03 ENCOUNTER — Ambulatory Visit (INDEPENDENT_AMBULATORY_CARE_PROVIDER_SITE_OTHER): Payer: Medicare (Managed Care)

## 2021-08-03 ENCOUNTER — Ambulatory Visit: Payer: Self-pay

## 2021-08-03 ENCOUNTER — Other Ambulatory Visit (INDEPENDENT_AMBULATORY_CARE_PROVIDER_SITE_OTHER): Payer: Self-pay

## 2021-08-03 VITALS — Wt 250.0 lb

## 2021-08-03 DIAGNOSIS — Z0000 Encounter for general adult medical examination without abnormal findings: Secondary | ICD-10-CM | POA: Diagnosis not present

## 2021-08-03 MED ORDER — AMOXICILLIN 500 MG PO TABS: 500.0000 mg | ORAL_TABLET | ORAL | 2 refills | Status: DC

## 2021-08-03 NOTE — Progress Notes (Signed)
Virtual Visit via Telephone Note  I connected with  Lisa Paul on 08/03/21 at  9:45 AM EDT by telephone and verified that I am speaking with the correct person using two identifiers.  Location: Patient: home Provider: [facility] Persons participating in the virtual visit: [place]   I discussed the limitations, risks, security and privacy concerns of performing an evaluation and management service by telephone and the availability of in person appointments. The patient expressed understanding and agreed to proceed.  Interactive audio and video telecommunications were attempted between this nurse and patient, however failed, due to patient having technical difficulties OR patient did not have access to video capability.  We continued and completed visit with audio only.  Some vital signs may be absent or patient reported.   Lisa David, LPN  Subjective:   Lisa Paul is a 87 y.o. female who presents for Medicare Annual (Subsequent) preventive examination.  Review of Systems           Objective:    There were no vitals filed for this visit. There is no height or weight on file to calculate BMI.     07/08/2021   10:15 AM 01/10/2021    9:51 AM 12/31/2020    4:55 PM 10/27/2020   11:15 AM 08/11/2019   11:18 AM 10/03/2017    6:04 AM 09/19/2017   10:02 AM  Advanced Directives  Does Patient Have a Medical Advance Directive? Yes Yes Yes Yes Yes Yes Yes  Type of Advance Directive Healthcare Power of Attorney Living will;Healthcare Power of [place];Living will Healthcare Power of [place];Living will Healthcare Power of [place]  Does patient want to make changes to medical advance directive? No - Patient declined No - Patient declined No - Patient declined No - Patient declined  No - Patient declined   Copy of [place] in Chart?   No - copy requested  Yes -  validated most recent copy scanned in chart (See row information) No - copy requested   Would patient like information on creating a medical advance directive? No - Patient declined          Current Medications (verified) Outpatient Encounter Medications as of 08/03/2021  Medication Sig   acetaminophen (TYLENOL) 500 MG tablet Take 2 tablets (1,000 mg total) by mouth every 8 (eight) hours as needed. (Patient taking differently: Take 500 mg by mouth every 8 (eight) hours as needed (for pain. (with tramadol)).)   amoxicillin (AMOXIL) 500 MG tablet Take 1 tablet (500 mg total) by mouth as directed. 4 tablets prior to dental procedures   COVID-19 mRNA Vac-TriS, Pfizer, SUSP injection Inject into the muscle.   Cyanocobalamin (B-12) 1000 MCG TABS Take 1 tablet by mouth daily. (Patient taking differently: Take 1,000 mcg by mouth daily.)   diphenhydrAMINE (BENADRYL) 25 mg capsule Take 25 mg by mouth every 6 (six) hours as needed. Equate brand - taking @@ night for runny eyes   erythromycin ophthalmic ointment Place 1 application into both eyes 2 (two) times daily.   famotidine (PEPCID) 20 MG tablet TAKE 1 TABLET BY MOUTH EVERY DAY IN THE MORNING   fluticasone (FLONASE) 50 MCG/ACT nasal spray SPRAY 2 SPRAYS INTO EACH NOSTRIL EVERY DAY   guaiFENesin (MUCINEX) 600 MG 12 hr tablet Take 600 mg by mouth 2 (two) times daily. As needed for runny eyes   hydrochlorothiazide (HYDRODIURIL) 25 MG tablet TAKE 1 TABLET  BY MOUTH EVERY DAY   letrozole (FEMARA) 2.5 MG tablet TAKE 1 TABLET BY MOUTH EVERY DAY   losartan-hydrochlorothiazide (HYZAAR) 100-25 MG tablet TAKE 1 TABLET BY MOUTH EVERY DAY   multivitamin (VIT W/EXTRA C) CHEW chewable tablet Chew by mouth.   MYRBETRIQ 50 MG TB24 tablet TAKE 1 TABLET BY MOUTH EVERY DAY (Patient not taking: Reported on 03/10/2021)   Polyethyl Glycol-Propyl Glycol 0.4-0.3 % SOLN Place 1 drop into both eyes 2 (two) times daily as needed (for dry eyes.).   No facility-administered  encounter medications on file as of 08/03/2021.    Allergies (verified) Tape   History: Past Medical History:  Diagnosis Date   Allergy    Anxiety    Arthritis 1993   Cancer ([place]) 2012   Left Breast with lumpectomy and mammosite   Cataract    Colon polyp 2005   Dyspnea    GERD (gastroesophageal reflux disease)    Goals of care, counseling/discussion 10/29/2020   Hypertension 15 years   Incontinence of urine    Lower extremity edema    Malignant neoplasm of breast (female), unspecified site    left breast   Neuropathy    Personal history of radiation therapy 2012   mammosite   Sleep apnea    Stage 1 breast cancer, ER+, right ([place]) 10/29/2020   Past Surgical History:  Procedure Laterality Date   ABDOMINAL HYSTERECTOMY  30 years ago   BREAST BIOPSY Left 2012   invasive mammary carcinoma   BREAST BIOPSY Right 10/19/2020   US biopsy/ path pending   BREAST LUMPECTOMY Left 2012   CARPAL TUNNEL RELEASE Bilateral    COLONOSCOPY  05/2003   Dr [facility]a [facility]   COLONOSCOPY  07/24/2012   EYE SURGERY Bilateral 2005   cataract   JOINT REPLACEMENT Bilateral 2007   knees   mammosite balloon placement Left 08/2010   Removal 09/2010   TONSILLECTOMY  age 21   TOTAL HIP ARTHROPLASTY Right 10/03/2017   Procedure: [place];  Surgeon: Lovell Sheehan, MD;  Location: [facility] ORS;  Service: Orthopedics;  Laterality: Right;   Family History  Problem Relation Age of Onset   Colon cancer Mother    Stroke Brother        possibly. Pt is unsure.   Breast cancer Maternal Aunt    Prostate cancer Neg Hx    Bladder Cancer Neg Hx    Kidney cancer Neg Hx    Social History   Socioeconomic History   Marital status: Widowed    Spouse name: Not on file   Number of children: 2   Years of education: Not on file   Highest education level: Master's degree (e.g., MA, MS, MEng, MEd, MSW, MBA)  Occupational History   Occupation: retired  Tobacco Use   Smoking status:  Never   Smokeless tobacco: Never  Vaping Use   Vaping Use: Never used  Substance and Sexual Activity   Alcohol use: No   Drug use: No   Sexual activity: Never  Other Topics Concern   Not on file  Social History Narrative   Not on file   Social Determinants of Health   Financial Resource Strain: Low Risk    Difficulty of Paying Living Expenses: Not hard at all  Food Insecurity: No Food Insecurity   Worried About Charity fundraiser in the Last Year: Never true   [place] in the Last Year: Never true  Transportation Needs: No Transportation Needs  Lack of Transportation (Medical): No   Lack of Transportation (Non-Medical): No  Physical Activity: Insufficiently Active   Days of Exercise per Week: 3 days   Minutes of Exercise per Session: 20 min  Stress: No Stress Concern Present   Feeling of Stress : Only a little  Social Connections: Moderately Integrated   Frequency of Communication with Friends and Family: More than three times a week   Frequency of Social Gatherings with Friends and Family: More than three times a week   Attends Religious Services: More than 4 times per year   Active Member of [employer] or Organizations: Yes   Attends Archivist Meetings: More than 4 times per year   Marital Status: Widowed    Tobacco Counseling Counseling given: Not Answered   Clinical Intake:  Pre-visit preparation completed: Yes  Pain : No/denies pain     Nutritional Risks: None Diabetes: No  How often do you need to have someone help you when you read instructions, pamphlets, or other written materials from your doctor or pharmacy?: 1 - Never  Diabetic?no  Interpreter Needed?: No  Information entered by :: Kirke Shaggy, LPN   Activities of Daily Living    02/23/2021    2:23 PM 12/31/2020    4:55 PM  In your present state of health, do you have any difficulty performing the following activities:  Hearing? 1 1  Vision? 0 1  Difficulty concentrating  or making decisions? 0 0  Walking or climbing stairs? 1 0  Dressing or bathing? 0 0  Doing errands, shopping? 1 0  Preparing Food and eating ?  N  Using the Toilet?  N  In the past six months, have you accidently leaked urine?  Y  Do you have problems with loss of bowel control?  N  Managing your Medications?  N  Managing your Finances?  N  Housekeeping or managing your Housekeeping?  N    Patient Care Team: Jerrol Banana., MD as PCP - General (Unknown Physician Specialty) Estill Cotta, MD as Consulting Physician (Ophthalmology) Clyde Canterbury, MD as Referring Physician (Otolaryngology) Ralene Bathe, MD as Consulting Physician (Dermatology) [name], MD as Consulting Physician (Oncology) Cordelia Poche, RN as Oncology Nurse Navigator  Indicate any recent Medical Services you may have received from other than [facility] providers in the past year (date may be approximate).     Assessment:   This is a routine wellness examination for [place].  Hearing/Vision screen No results found.  Dietary issues and exercise activities discussed:     Goals Addressed   None    Depression Screen    02/23/2021    2:22 PM 12/31/2020    4:52 PM 12/11/2019   10:51 AM 08/11/2019   11:13 AM 06/09/2019   12:07 PM 07/30/2017    2:19 PM 03/28/2017   12:01 PM  PHQ 2/9 Scores  PHQ - 2 Score 0 0 2 0 '3 1 2  '$ PHQ- 9 Score '1  3  8 5 8    '$ Fall Risk    02/23/2021    2:24 PM 12/31/2020    4:55 PM 08/11/2019   11:18 AM 03/11/2019    1:34 PM 05/10/2016    1:26 PM  [place] in the past year? 0 1 0 0 No  Number falls in past yr: 0 0 0 0   Injury with Fall? 0 0 0 0   Risk for fall due to : No Fall  Risks;Impaired balance/gait No Fall Risks     Follow up  Falls evaluation completed       FALL RISK PREVENTION PERTAINING TO THE HOME:  Any stairs in or around the home? Yes  If so, are there any without handrails? No  Home free of loose throw rugs in walkways, pet beds,  electrical cords, etc? Yes  Adequate lighting in your home to reduce risk of falls? Yes   ASSISTIVE DEVICES UTILIZED TO PREVENT FALLS:  Life alert? Yes  Use of a cane, walker or w/c? Yes  Grab bars in the bathroom? Yes  Shower chair or bench in shower? No  Elevated toilet seat or a handicapped toilet? Yes    Cognitive Function:    12/11/2019   10:52 AM 07/30/2017    2:00 PM  MMSE - Mini Mental State Exam  Orientation to time 4 5  Orientation to Place 5 5  Registration 3 3  Attention/ Calculation 5 5  Recall 3 2  Language- name 2 objects 2 2  Language- repeat 1 1  Language- follow 3 step command 3 3  Language- read & follow direction 1 1  Write a sentence 1 1  Copy design 1 0  Total score 29 28        12/31/2020    4:57 PM 05/10/2016    1:37 PM  6CIT Screen  What Year? 0 points 0 points  What month? 0 points 0 points  What time? 0 points 0 points  Count back from 20 0 points 0 points  Months in reverse 0 points 0 points  Repeat phrase 0 points 6 points  Total Score 0 points 6 points    Immunizations Immunization History  Administered Date(s) Administered   Fluad Quad(high Dose 65+) 01/14/2019, 12/11/2019, 02/23/2021   Influenza, High Dose Seasonal PF 02/18/2015, 04/28/2016, 01/24/2017, 01/23/2018   Influenza-Unspecified 01/01/2013   PFIZER Comirnaty(Gray Top)Covid-19 Tri-Sucrose Vaccine 10/27/2020   PFIZER(Purple Top)SARS-COV-2 Vaccination 05/15/2019, 03/12/2020   Pfizer Covid-19 Vaccine Bivalent Booster 49yr & up 05/26/2021   Pneumococcal Conjugate-13 05/07/2014   Pneumococcal Polysaccharide-23 02/15/1998, 07/11/2011   Td 03/12/2003   Tdap 05/07/2014   Zoster, Live 06/08/2007    TDAP status: Up to date  Flu Vaccine status: Up to date  Pneumococcal vaccine status: Up to date  Covid-19 vaccine status: Completed vaccines  Qualifies for Shingles Vaccine? Yes   Zostavax completed Yes   Shingrix Completed?: No.    Education has been provided regarding the  importance of this vaccine. Patient has been advised to call insurance company to determine out of pocket expense if they have not yet received this vaccine. Advised may also receive vaccine at local pharmacy or Health Dept. Verbalized acceptance and understanding.  Screening Tests Health Maintenance  Topic Date Due   Zoster Vaccines- Shingrix (1 of 2) Never done   INFLUENZA VACCINE  11/01/2021   TETANUS/TDAP  05/07/2024   Pneumonia Vaccine 87 Years old  Completed   DEXA SCAN  Completed   COVID-19 Vaccine  Completed   HPV VACCINES  Aged Out    Health Maintenance  Health Maintenance Due  Topic Date Due   Zoster Vaccines- Shingrix (1 of 2) Never done    Colorectal cancer screening: No longer required.   Mammogram status: No longer required due to age.  Bone Density status: Completed 07/01/14. Results reflect: Bone density results: NORMAL. Repeat every 5 years.- declined referral  Lung Cancer Screening: (Low Dose CT Chest recommended if Age 85-80years, 30 pack-year  currently smoking OR have quit w/in 15years.) does not qualify.   Additional Screening:  Hepatitis C Screening: does not qualify; Completed no  Vision Screening: Recommended annual ophthalmology exams for early detection of glaucoma and other disorders of the eye. Is the patient up to date with their annual eye exam?  Yes  Who is the provider or what is the name of the office in which the patient attends annual eye exams? [facility] If pt is not established with a provider, would they like to be referred to a provider to establish care? No .   Dental Screening: Recommended annual dental exams for proper oral hygiene  Community Resource Referral / Chronic Care Management: CRR required this visit?  No   CCM required this visit?  No      Plan:     I have personally reviewed and noted the following in the patient's chart:   Medical and social history Use of alcohol, tobacco or illicit drugs  Current  medications and supplements including opioid prescriptions.  Functional ability and status Nutritional status Physical activity Advanced directives List of other physicians Hospitalizations, surgeries, and ER visits in previous 12 months Vitals Screenings to include cognitive, depression, and falls Referrals and appointments  In addition, I have reviewed and discussed with patient certain preventive protocols, quality metrics, and best practice recommendations. A written personalized care plan for preventive services as well as general preventive health recommendations were provided to patient.     Lisa David, LPN   05/10/621   Nurse Notes: none

## 2021-08-03 NOTE — Patient Instructions (Signed)
Lisa Paul , ?Thank you for taking time to come for your Medicare Wellness Visit. I appreciate your ongoing commitment to your health goals. Please review the following plan we discussed and let me know if I can assist you in the future.  ? ?Screening recommendations/referrals: ?Colonoscopy: aged out ?Mammogram: aged out ?Bone Density: declined referral ?Recommended yearly ophthalmology/optometry visit for glaucoma screening and checkup ?Recommended yearly dental visit for hygiene and checkup ? ?Vaccinations: ?Influenza vaccine: 02/23/21 ?Pneumococcal vaccine: 05/07/14 ?Tdap vaccine: 05/07/14 ?Shingles vaccine: Zostavax 06/08/07   ?Covid-19:05/15/19, 03/12/20, 10/27/20, 05/26/21 ? ?Advanced directives: yes ? ?Conditions/risks identified: no ? ?Next appointment: Follow up in one year for your annual wellness visit 08/08/22 @ 9:15am by phone ? ? ?Preventive Care 85 Years and Older, Female ?Preventive care refers to lifestyle choices and visits with your health care provider that can promote health and wellness. ?What does preventive care include? ?A yearly physical exam. This is also called an annual well check. ?Dental exams once or twice a year. ?Routine eye exams. Ask your health care provider how often you should have your eyes checked. ?Personal lifestyle choices, including: ?Daily care of your teeth and gums. ?Regular physical activity. ?Eating a healthy diet. ?Avoiding tobacco and drug use. ?Limiting alcohol use. ?Practicing safe sex. ?Taking low-dose aspirin every day. ?Taking vitamin and mineral supplements as recommended by your health care provider. ?What happens during an annual well check? ?The services and screenings done by your health care provider during your annual well check will depend on your age, overall health, lifestyle risk factors, and family history of disease. ?Counseling  ?Your health care provider may ask you questions about your: ?Alcohol use. ?Tobacco use. ?Drug use. ?Emotional well-being. ?Home  and relationship well-being. ?Sexual activity. ?Eating habits. ?History of falls. ?Memory and ability to understand (cognition). ?Work and work Statistician. ?Reproductive health. ?Screening  ?You may have the following tests or measurements: ?Height, weight, and BMI. ?Blood pressure. ?Lipid and cholesterol levels. These may be checked every 5 years, or more frequently if you are over 17 years old. ?Skin check. ?Lung cancer screening. You may have this screening every year starting at age 85 if you have a 30-pack-year history of smoking and currently smoke or have quit within the past 15 years. ?Fecal occult blood test (FOBT) of the stool. You may have this test every year starting at age 85. ?Flexible sigmoidoscopy or colonoscopy. You may have a sigmoidoscopy every 5 years or a colonoscopy every 10 years starting at age 85. ?Hepatitis C blood test. ?Hepatitis B blood test. ?Sexually transmitted disease (STD) testing. ?Diabetes screening. This is done by checking your blood sugar (glucose) after you have not eaten for a while (fasting). You may have this done every 1-3 years. ?Bone density scan. This is done to screen for osteoporosis. You may have this done starting at age 30. ?Mammogram. This may be done every 1-2 years. Talk to your health care provider about how often you should have regular mammograms. ?Talk with your health care provider about your test results, treatment options, and if necessary, the need for more tests. ?Vaccines  ?Your health care provider may recommend certain vaccines, such as: ?Influenza vaccine. This is recommended every year. ?Tetanus, diphtheria, and acellular pertussis (Tdap, Td) vaccine. You may need a Td booster every 10 years. ?Zoster vaccine. You may need this after age 85. ?Pneumococcal 13-valent conjugate (PCV13) vaccine. One dose is recommended after age 85. ?Pneumococcal polysaccharide (PPSV23) vaccine. One dose is recommended after age 85. ?Talk  to your health care provider  about which screenings and vaccines you need and how often you need them. ?This information is not intended to replace advice given to you by your health care provider. Make sure you discuss any questions you have with your health care provider. ?Document Released: 04/16/2015 Document Revised: 12/08/2015 Document Reviewed: 01/19/2015 ?Elsevier Interactive Patient Education ? 2017 [employer]. ? ?Fall Prevention in the Home ?Falls can cause injuries. They can happen to people of all ages. There are many things you can do to make your home safe and to help prevent falls. ?What can I do on the outside of my home? ?Regularly fix the edges of walkways and driveways and fix any cracks. ?Remove anything that might make you trip as you walk through a door, such as a raised step or threshold. ?Trim any bushes or trees on the path to your home. ?Use bright outdoor lighting. ?Clear any walking paths of anything that might make someone trip, such as rocks or tools. ?Regularly check to see if handrails are loose or broken. Make sure that both sides of any steps have handrails. ?Any raised decks and porches should have guardrails on the edges. ?Have any leaves, snow, or ice cleared regularly. ?Use sand or salt on walking paths during winter. ?Clean up any spills in your garage right away. This includes oil or grease spills. ?What can I do in the bathroom? ?Use night lights. ?Install grab bars by the toilet and in the tub and shower. Do not use towel bars as grab bars. ?Use non-skid mats or decals in the tub or shower. ?If you need to sit down in the shower, use a plastic, non-slip stool. ?Keep the floor dry. Clean up any water that spills on the floor as soon as it happens. ?Remove soap buildup in the tub or shower regularly. ?Attach bath mats securely with double-sided non-slip rug tape. ?Do not have throw rugs and other things on the floor that can make you trip. ?What can I do in the bedroom? ?Use night lights. ?Make sure  that you have a light by your bed that is easy to reach. ?Do not use any sheets or blankets that are too big for your bed. They should not hang down onto the floor. ?Have a firm chair that has side arms. You can use this for support while you get dressed. ?Do not have throw rugs and other things on the floor that can make you trip. ?What can I do in the kitchen? ?Clean up any spills right away. ?Avoid walking on wet floors. ?Keep items that you use a lot in easy-to-reach places. ?If you need to reach something above you, use a strong step stool that has a grab bar. ?Keep electrical cords out of the way. ?Do not use floor polish or wax that makes floors slippery. If you must use wax, use non-skid floor wax. ?Do not have throw rugs and other things on the floor that can make you trip. ?What can I do with my stairs? ?Do not leave any items on the stairs. ?Make sure that there are handrails on both sides of the stairs and use them. Fix handrails that are broken or loose. Make sure that handrails are as long as the stairways. ?Check any carpeting to make sure that it is firmly attached to the stairs. Fix any carpet that is loose or worn. ?Avoid having throw rugs at the top or bottom of the stairs. If you do have throw rugs,  attach them to the floor with carpet tape. ?Make sure that you have a light switch at the top of the stairs and the bottom of the stairs. If you do not have them, ask someone to add them for you. ?What else can I do to help prevent falls? ?Wear shoes that: ?Do not have high heels. ?Have rubber bottoms. ?Are comfortable and fit you well. ?Are closed at the toe. Do not wear sandals. ?If you use a stepladder: ?Make sure that it is fully opened. Do not climb a closed stepladder. ?Make sure that both sides of the stepladder are locked into place. ?Ask someone to hold it for you, if possible. ?Clearly mark and make sure that you can see: ?Any grab bars or handrails. ?First and last steps. ?Where the edge of  each step is. ?Use tools that help you move around (mobility aids) if they are needed. These include: ?Canes. ?Walkers. ?Scooters. ?Crutches. ?Turn on the lights when you go into a dark area. Replace any

## 2021-08-03 NOTE — Telephone Encounter (Signed)
Done

## 2021-08-03 NOTE — Telephone Encounter (Signed)
Pt called on [phone] and unable to get thru. Got message call unable to completed at this time. Will route to provider since pt has this medication on her med list for dental procedure.  ?

## 2021-08-03 NOTE — Telephone Encounter (Signed)
Pt called on both numbers in chart, got message "call cant be completed at this time..." will try again later.  ? ?Summary: advice - medication  ? Pt called in stating she is having a dental procedure done and wanted to know if something could be called in for this. Pt stated she previously had amoxicillin (AMOXIL) called in before she had dental work, please advise.  ?  ? ?

## 2021-11-07 ENCOUNTER — Telehealth: Payer: Self-pay | Admitting: *Deleted

## 2021-11-07 ENCOUNTER — Encounter: Payer: Self-pay | Admitting: Family

## 2021-11-07 ENCOUNTER — Inpatient Hospital Stay: Payer: Medicare (Managed Care) | Attending: Hematology & Oncology

## 2021-11-07 ENCOUNTER — Other Ambulatory Visit: Payer: Self-pay

## 2021-11-07 ENCOUNTER — Inpatient Hospital Stay (HOSPITAL_BASED_OUTPATIENT_CLINIC_OR_DEPARTMENT_OTHER): Payer: Medicare (Managed Care) | Admitting: Family

## 2021-11-07 VITALS — BP 163/70 | HR 84 | Temp 98.2°F | Resp 19 | Ht 66.0 in | Wt 251.4 lb

## 2021-11-07 DIAGNOSIS — Z79811 Long term (current) use of aromatase inhibitors: Secondary | ICD-10-CM | POA: Diagnosis not present

## 2021-11-07 DIAGNOSIS — C50911 Malignant neoplasm of unspecified site of right female breast: Secondary | ICD-10-CM | POA: Insufficient documentation

## 2021-11-07 DIAGNOSIS — Z853 Personal history of malignant neoplasm of breast: Secondary | ICD-10-CM | POA: Diagnosis not present

## 2021-11-07 DIAGNOSIS — Z170 Estrogen receptor positive status [ER+]: Secondary | ICD-10-CM | POA: Insufficient documentation

## 2021-11-07 LAB — CMP (CANCER CENTER ONLY)
ALT: 10 U/L (ref 0–44)
AST: 14 U/L — ABNORMAL LOW (ref 15–41)
Albumin: 4.1 g/dL (ref 3.5–5.0)
Alkaline Phosphatase: 57 U/L (ref 38–126)
Anion gap: 8 (ref 5–15)
BUN: 15 mg/dL (ref 8–23)
CO2: 26 mmol/L (ref 22–32)
Calcium: 10 mg/dL (ref 8.9–10.3)
Chloride: 105 mmol/L (ref 98–111)
Creatinine: 0.87 mg/dL (ref 0.44–1.00)
GFR, Estimated: >60 mL/min (ref >60)
Glucose, Bld: 103 mg/dL — ABNORMAL HIGH (ref 70–99)
Potassium: 3.9 mmol/L (ref 3.5–5.1)
Sodium: 139 mmol/L (ref 135–145)
Total Bilirubin: 0.6 mg/dL (ref 0.3–1.2)
Total Protein: 6.9 g/dL (ref 6.5–8.1)

## 2021-11-07 LAB — CBC WITH DIFFERENTIAL (CANCER CENTER ONLY)
Abs Immature Granulocytes: 0.02 10*3/uL (ref 0.00–0.07)
Basophils Absolute: 0.1 10*3/uL (ref 0.0–0.1)
Basophils Relative: 1 %
Eosinophils Absolute: 0.2 10*3/uL (ref 0.0–0.5)
Eosinophils Relative: 2 %
HCT: 43.1 % (ref 36.0–46.0)
Hemoglobin: 13.7 g/dL (ref 12.0–15.0)
Immature Granulocytes: 0 %
Lymphocytes Relative: 26 %
Lymphs Abs: 2.1 10*3/uL (ref 0.7–4.0)
MCH: 30 pg (ref 26.0–34.0)
MCHC: 31.8 g/dL (ref 30.0–36.0)
MCV: 94.5 fL (ref 80.0–100.0)
Monocytes Absolute: 0.7 10*3/uL (ref 0.1–1.0)
Monocytes Relative: 8 %
Neutro Abs: 5.1 10*3/uL (ref 1.7–7.7)
Neutrophils Relative %: 63 %
Platelet Count: 246 10*3/uL (ref 150–400)
RBC: 4.56 MIL/uL (ref 3.87–5.11)
RDW: 13.1 % (ref 11.5–15.5)
WBC Count: 8.1 10*3/uL (ref 4.0–10.5)
nRBC: 0 % (ref 0.0–0.2)

## 2021-11-07 NOTE — Progress Notes (Signed)
Hematology and Oncology Follow Up Visit  Lisa Paul [id number] 1934/10/20 87 y.o. 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 Remote history of stage I carcinoma of the left breast (lumpectomy only in 2015)   Current Therapy:        Lumpectomy on 12/27/2020 Femara 2.5 mg PO daily - started in January 2023 per patient    Interim History:  Lisa Paul is here today with her son that lives here in town for follow-up. She is doing fairly well and states that she is scheduled for her annual mammogram tomorrow with [facility]. She is scheduled for follow-up with her surgeon next week as well.  Bilateral breast exam today was negative. No mass, lesion or rash noted.  No adenopathy or lymphedema noted.  No fever, chills, n/v, cough, dizziness, SOB, chest pain, palpitations, abdominal pain or changes in bowel or bladder habits.  She has osteoarthritis in her legs with generalized aches and pains.  No swelling, numbness or tingling in her extremities.  No recent falls or syncope to report. She is ambulating with a cane for added support.  Appetite and hydration are good. Her weight is stable at 251 lbs.   ECOG Performance Status: 1 - Symptomatic but completely ambulatory  Medications:  Allergies as of 11/07/2021       Reactions   Tape Other (See Comments)   Paper tape - blisters Use Paper tape        Medication List        Accurate as of November 07, 2021  1:11 PM. If you have any questions, ask your nurse or doctor.          acetaminophen 500 MG tablet Commonly known as: TYLENOL Take 2 tablets (1,000 mg total) by mouth every 8 (eight) hours as needed. What changed:  how much to take reasons to take this   amoxicillin 500 MG tablet Commonly known as: AMOXIL Take 1 tablet (500 mg total) by mouth as directed. 4 tablets prior to dental procedures   B-12 1000 MCG Tabs Take 1 tablet by mouth daily.    diphenhydrAMINE 25 mg capsule Commonly known as: BENADRYL Take 25 mg by mouth every 6 (six) hours as needed. Equate brand - taking @@ night for runny eyes   erythromycin ophthalmic ointment Place 1 application into both eyes 2 (two) times daily.   famotidine 20 MG tablet Commonly known as: PEPCID TAKE 1 TABLET BY MOUTH EVERY DAY IN THE MORNING   fluticasone 50 MCG/ACT nasal spray Commonly known as: FLONASE SPRAY 2 SPRAYS INTO EACH NOSTRIL EVERY DAY   guaiFENesin 600 MG 12 hr tablet Commonly known as: MUCINEX Take 600 mg by mouth 2 (two) times daily. As needed for runny eyes   hydrochlorothiazide 25 MG tablet Commonly known as: HYDRODIURIL TAKE 1 TABLET BY MOUTH EVERY DAY   letrozole 2.5 MG tablet Commonly known as: FEMARA TAKE 1 TABLET BY MOUTH EVERY DAY   losartan-hydrochlorothiazide 100-25 MG tablet Commonly known as: HYZAAR TAKE 1 TABLET BY MOUTH EVERY DAY   multivitamin Chew chewable tablet Chew by mouth.   Myrbetriq 50 MG Tb24 tablet Generic drug: mirabegron ER TAKE 1 TABLET BY MOUTH EVERY DAY   Pfizer-BioNT COVID-19 Vac-TriS Susp injection Generic drug: COVID-19 mRNA Vac-TriS (Pfizer) Inject into the muscle.   Polyethyl Glycol-Propyl Glycol 0.4-0.3 % Soln Place 1 drop into both eyes 2 (two) times daily as needed (for dry eyes.).  Allergies:  Allergies  Allergen Reactions   Tape Other (See Comments)    Paper tape - blisters Use Paper tape    Past Medical History, Surgical history, Social history, and Family History were reviewed and updated.  Review of Systems: All other 10 point review of systems is negative.   Physical Exam:  vitals were not taken for this visit.   Wt Readings from Last 3 Encounters:  08/03/21 250 lb (113.4 kg)  07/08/21 250 lb (113.4 kg)  03/10/21 248 lb (112.5 kg)    Ocular: Sclerae unicteric, pupils equal, round and reactive to light Ear-nose-throat: Oropharynx clear, dentition fair Lymphatic: No cervical or  supraclavicular adenopathy Lungs no rales or rhonchi, good excursion bilaterally Heart regular rate and rhythm, no murmur appreciated Abd soft, nontender, positive bowel sounds MSK no focal spinal tenderness, no joint edema Neuro: non-focal, well-oriented, appropriate affect Breasts: Deferred   Lab Results  Component Value Date   WBC 8.1 11/07/2021   HGB 13.7 11/07/2021   HCT 43.1 11/07/2021   MCV 94.5 11/07/2021   PLT 246 11/07/2021   No results found for: "FERRITIN", "IRON", "TIBC", "UIBC", "IRONPCTSAT" Lab Results  Component Value Date   RBC 4.56 11/07/2021   No results found for: "KPAFRELGTCHN", "LAMBDASER", "KAPLAMBRATIO" No results found for: "IGGSERUM", "IGA", "IGMSERUM" No results found for: "TOTALPROTELP", "ALBUMINELP", "A1GS", "A2GS", "BETS", "BETA2SER", "GAMS", "MSPIKE", "SPEI"   Chemistry      Component Value Date/Time   NA 140 07/08/2021 0928   NA 141 02/23/2021 1507   K 3.7 07/08/2021 0928   CL 106 07/08/2021 0928   CO2 24 07/08/2021 0928   BUN 18 07/08/2021 0928   BUN 15 02/23/2021 1507   CREATININE 1.00 07/08/2021 0928      Component Value Date/Time   CALCIUM 9.9 07/08/2021 0928   ALKPHOS 57 07/08/2021 0928   AST 14 (L) 07/08/2021 0928   ALT 13 07/08/2021 0928   BILITOT 0.6 07/08/2021 0928       Impression and Plan: Lisa Paul is a very pleasant 87 yo caucasian female with remote history of stage I carcinoma of the left breast treated with lumpectomy.  She now has a new primary of the right breast stage IA (T1bN0M0) ER+/PR+/HER2+ invasive ductal carcinoma with lumpectomy in September 2022. She is doing well on Femara (started in January 2023).  We will keep an eye out for her mammogram results after she has tomorrow.  Follow-up in 4 months.   Lottie Dawson, NP 8/7/20231:11 PM

## 2021-11-07 NOTE — Progress Notes (Signed)
Patient here today with her son Lisa Paul. He is quite upset that his brother Lisa Paul who lives in [place] is also listed on her Medical Power of attorney. He was quite verbal about how he spent a lot of time taking the patient to her appointments and if he wasn't the real medical power of attorney he would just take his inheritance now and leave. He stepped out during the patient's breast exam and she expressed, in private, that she would prefer her other son be medical power of attorney but she lives with Lisa Paul right now and doesn't want him to be upset. They are also looking at getting her into an assisted living facility. At this time she does not want to change her MPOA. She denies feeling unsafe at this time.

## 2021-11-07 NOTE — Telephone Encounter (Signed)
Called and lvm of upcoming appointments - requested callback to confirm 

## 2021-11-08 ENCOUNTER — Encounter: Payer: Self-pay | Admitting: *Deleted

## 2021-11-08 NOTE — Progress Notes (Signed)
Patient is stable on current AI therapy. Her mammogram today was WNL. Will discontinue active navigation at this time, but be available as needed in the future.   Oncology Nurse Navigator Documentation     11/08/2021    1:15 PM  Oncology Nurse Navigator Flowsheets  Navigation Complete Date: 11/08/2021  Post Navigation: Continue to Follow Patient? No  Reason Not Navigating Patient: Patient On Maintenance Chemotherapy  Navigator Location [facility]  Navigator Encounter Type Appt/Treatment Plan Review  Patient Visit Type MedOnc  Treatment Phase Active Tx  Barriers/Navigation Needs No Barriers At This Time  Interventions None Required  Acuity Level 1-No Barriers  Support Groups/Services Friends and Family  Time Spent with Patient 15

## 2021-11-09 ENCOUNTER — Encounter (INDEPENDENT_AMBULATORY_CARE_PROVIDER_SITE_OTHER): Payer: Self-pay

## 2022-01-05 ENCOUNTER — Other Ambulatory Visit: Payer: Self-pay | Admitting: Family Medicine

## 2022-01-05 DIAGNOSIS — J309 Allergic rhinitis, unspecified: Secondary | ICD-10-CM

## 2022-01-05 NOTE — Telephone Encounter (Signed)
Requested Prescriptions  Pending Prescriptions Disp Refills  . fluticasone (FLONASE) 50 MCG/ACT nasal spray [Pharmacy Med Name: FLUTICASONE PROP 50 MCG SPRAY] 48 mL 1    Sig: SPRAY 2 SPRAYS INTO EACH NOSTRIL EVERY DAY     Ear, Nose, and Throat: Nasal Preparations - Corticosteroids Passed - 01/05/2022  2:50 AM      Passed - Valid encounter within last 12 months    Recent Outpatient Visits          10 months ago Annual physical exam   [facility] Jerrol Banana., MD   2 years ago Annual physical exam   [facility] Jerrol Banana., MD   2 years ago Essential hypertension   [facility] Jerrol Banana., MD   4 years ago Preoperative clearance   [facility] Jerrol Banana., MD   4 years ago Depression, unspecified depression type   [facility] Jerrol Banana., MD      Future Appointments            In 1 month Simmons-Robinson, Riki Sheer, MD [facility] [place]

## 2022-01-22 ENCOUNTER — Other Ambulatory Visit: Payer: Self-pay | Admitting: Family Medicine

## 2022-01-22 DIAGNOSIS — I10 Essential (primary) hypertension: Secondary | ICD-10-CM

## 2022-02-28 ENCOUNTER — Encounter: Payer: Medicare PPO | Admitting: Family Medicine

## 2022-03-09 ENCOUNTER — Ambulatory Visit: Payer: Medicare (Managed Care) | Admitting: Hematology & Oncology

## 2022-03-09 ENCOUNTER — Other Ambulatory Visit: Payer: Medicare (Managed Care)

## 2022-03-16 ENCOUNTER — Ambulatory Visit: Payer: Medicare (Managed Care) | Admitting: Hematology & Oncology

## 2022-03-16 ENCOUNTER — Inpatient Hospital Stay: Payer: Medicare (Managed Care)

## 2022-03-23 ENCOUNTER — Inpatient Hospital Stay: Payer: Medicare (Managed Care) | Attending: Hematology & Oncology

## 2022-03-23 ENCOUNTER — Encounter: Payer: Self-pay | Admitting: Hematology & Oncology

## 2022-03-23 ENCOUNTER — Inpatient Hospital Stay (HOSPITAL_BASED_OUTPATIENT_CLINIC_OR_DEPARTMENT_OTHER): Payer: Medicare (Managed Care) | Admitting: Hematology & Oncology

## 2022-03-23 ENCOUNTER — Other Ambulatory Visit: Payer: Self-pay

## 2022-03-23 VITALS — BP 154/71 | HR 89 | Temp 98.2°F | Resp 19 | Ht 66.0 in | Wt 247.0 lb

## 2022-03-23 DIAGNOSIS — C50911 Malignant neoplasm of unspecified site of right female breast: Secondary | ICD-10-CM | POA: Diagnosis not present

## 2022-03-23 DIAGNOSIS — Z170 Estrogen receptor positive status [ER+]: Secondary | ICD-10-CM | POA: Insufficient documentation

## 2022-03-23 DIAGNOSIS — Z79899 Other long term (current) drug therapy: Secondary | ICD-10-CM | POA: Diagnosis not present

## 2022-03-23 DIAGNOSIS — Z79811 Long term (current) use of aromatase inhibitors: Secondary | ICD-10-CM | POA: Insufficient documentation

## 2022-03-23 DIAGNOSIS — Z853 Personal history of malignant neoplasm of breast: Secondary | ICD-10-CM

## 2022-03-23 LAB — CBC WITH DIFFERENTIAL (CANCER CENTER ONLY)
Abs Immature Granulocytes: 0.03 10*3/uL (ref 0.00–0.07)
Basophils Absolute: 0.1 10*3/uL (ref 0.0–0.1)
Basophils Relative: 1 %
Eosinophils Absolute: 0.2 10*3/uL (ref 0.0–0.5)
Eosinophils Relative: 3 %
HCT: 41.4 % (ref 36.0–46.0)
Hemoglobin: 13.4 g/dL (ref 12.0–15.0)
Immature Granulocytes: 0 %
Lymphocytes Relative: 28 %
Lymphs Abs: 2.5 10*3/uL (ref 0.7–4.0)
MCH: 30.5 pg (ref 26.0–34.0)
MCHC: 32.4 g/dL (ref 30.0–36.0)
MCV: 94.1 fL (ref 80.0–100.0)
Monocytes Absolute: 0.9 10*3/uL (ref 0.1–1.0)
Monocytes Relative: 10 %
Neutro Abs: 5.4 10*3/uL (ref 1.7–7.7)
Neutrophils Relative %: 58 %
Platelet Count: 268 10*3/uL (ref 150–400)
RBC: 4.4 MIL/uL (ref 3.87–5.11)
RDW: 12.8 % (ref 11.5–15.5)
WBC Count: 9.1 10*3/uL (ref 4.0–10.5)
nRBC: 0 % (ref 0.0–0.2)

## 2022-03-23 LAB — CMP (CANCER CENTER ONLY)
ALT: 12 U/L (ref 0–44)
AST: 16 U/L (ref 15–41)
Albumin: 4.1 g/dL (ref 3.5–5.0)
Alkaline Phosphatase: 57 U/L (ref 38–126)
Anion gap: 12 (ref 5–15)
BUN: 20 mg/dL (ref 8–23)
CO2: 26 mmol/L (ref 22–32)
Calcium: 10 mg/dL (ref 8.9–10.3)
Chloride: 105 mmol/L (ref 98–111)
Creatinine: 1.02 mg/dL — ABNORMAL HIGH (ref 0.44–1.00)
GFR, Estimated: 54 mL/min — ABNORMAL LOW (ref >60)
Glucose, Bld: 103 mg/dL — ABNORMAL HIGH (ref 70–99)
Potassium: 3.4 mmol/L — ABNORMAL LOW (ref 3.5–5.1)
Sodium: 143 mmol/L (ref 135–145)
Total Bilirubin: 0.5 mg/dL (ref 0.3–1.2)
Total Protein: 7.2 g/dL (ref 6.5–8.1)

## 2022-03-23 LAB — LACTATE DEHYDROGENASE: LDH: 152 U/L (ref 98–192)

## 2022-03-23 NOTE — Progress Notes (Signed)
Hematology and Oncology Follow Up Visit  KEYONNA COMUNALE [id number] 08-Oct-1934 87 y.o. 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  Current Therapy:   Right lumpectomy  on 12/27/2020 Femara 2.5 mg p.o. daily-start on 01/10/2021     Interim History:  Ms. Scogin is back for follow-up.  She is doing quite well.  We last saw her back in August.  Since then, she really has had no complaints.  She has recovered from her surgery.  She had the surgery I think back in September.  She is on Femara.  She is doing well on the Femara.  She is having some leg issues.  She has had this chronically.  Not sure exactly what the source of this might be.  She has had no change in bowel or bladder habits.  She has had no cough or shortness of breath.  She has had no nausea or vomiting.  There is been no bleeding.  She has had no rashes.  Currently, I would say performance status is probably ECOG 1.   Medications:  Current Outpatient Medications:    acetaminophen (TYLENOL) 500 MG tablet, Take 2 tablets (1,000 mg total) by mouth every 8 (eight) hours as needed. (Patient taking differently: Take 500 mg by mouth every 8 (eight) hours as needed (for pain. (with tramadol)).), Disp: 30 tablet, Rfl: 0   Cyanocobalamin (B-12) 1000 MCG TABS, Take 1 tablet by mouth daily. (Patient taking differently: Take 1,000 mcg by mouth daily.), Disp: 30 tablet, Rfl:    famotidine (PEPCID) 20 MG tablet, TAKE 1 TABLET BY MOUTH EVERY DAY IN THE MORNING, Disp: 30 tablet, Rfl: 2   fluticasone (FLONASE) 50 MCG/ACT nasal spray, SPRAY 2 SPRAYS INTO EACH NOSTRIL EVERY DAY, Disp: 48 mL, Rfl: 1   guaiFENesin (MUCINEX) 600 MG 12 hr tablet, Take 600 mg by mouth 2 (two) times daily. As needed for runny eyes, Disp: , Rfl:    hydrochlorothiazide (HYDRODIURIL) 25 MG tablet, TAKE 1 TABLET BY MOUTH EVERY DAY, Disp: 90 tablet, Rfl: 1   letrozole (FEMARA) 2.5 MG tablet, TAKE 1  TABLET BY MOUTH EVERY DAY, Disp: 90 tablet, Rfl: 2   losartan-hydrochlorothiazide (HYZAAR) 100-25 MG tablet, TAKE 1 TABLET BY MOUTH EVERY DAY, Disp: 90 tablet, Rfl: 0   multivitamin (VIT W/EXTRA C) CHEW chewable tablet, Chew by mouth., Disp: , Rfl:    Polyethyl Glycol-Propyl Glycol 0.4-0.3 % SOLN, Place 1 drop into both eyes 2 (two) times daily as needed (for dry eyes.)., Disp: , Rfl:    amoxicillin (AMOXIL) 500 MG tablet, Take 1 tablet (500 mg total) by mouth as directed. 4 tablets prior to dental procedures (Patient not taking: Reported on 11/07/2021), Disp: 4 tablet, Rfl: 2   diphenhydrAMINE (BENADRYL) 25 mg capsule, Take 25 mg by mouth every 6 (six) hours as needed. Equate brand - taking @@ night for runny eyes (Patient not taking: Reported on 11/07/2021), Disp: , Rfl:   Allergies:  Allergies  Allergen Reactions   Tape Other (See Comments)    Paper tape - blisters Use Paper tape    Past Medical History, Surgical history, Social history, and Family History were reviewed and updated.  Review of Systems: Review of Systems  Constitutional: Negative.   HENT:  Negative.    Eyes: Negative.   Respiratory: Negative.    Cardiovascular: Negative.   Gastrointestinal: Negative.   Endocrine: Negative.   Genitourinary: Negative.    Musculoskeletal: Negative.   Skin:  Negative.   Neurological: Negative.   Hematological: Negative.   Psychiatric/Behavioral: Negative.      Physical Exam:  height is _0  (1.676 m) and weight is 247 lb (112 kg). Her oral temperature is 98.2 F (36.8 C). Her blood pressure is 154/71 (abnormal) and her pulse is 89. Her respiration is 19 and oxygen saturation is 100%.   Wt Readings from Last 3 Encounters:  03/23/22 247 lb (112 kg)  11/07/21 251 lb 6.4 oz (114 kg)  08/03/21 250 lb (113.4 kg)    Physical Exam Vitals reviewed.  Constitutional:      Comments: Her breast exam show left breast with no masses, edema or erythema.  There is no left axillary adenopathy.   Right breast shows the healed lumpectomy at about the 8 o'clock position.  There is no erythema at the lumpectomy site.  There may be a little bit of firmness.  There is no nipple discharge.  There is no right axillary adenopathy.  HENT:     Head: Normocephalic and atraumatic.  Eyes:     Pupils: Pupils are equal, round, and reactive to light.  Cardiovascular:     Rate and Rhythm: Normal rate and regular rhythm.     Heart sounds: Normal heart sounds.  Pulmonary:     Effort: Pulmonary effort is normal.     Breath sounds: Normal breath sounds.  Abdominal:     General: Bowel sounds are normal.     Palpations: Abdomen is soft.  Musculoskeletal:        General: No tenderness or deformity. Normal range of motion.     Cervical back: Normal range of motion.  Lymphadenopathy:     Cervical: No cervical adenopathy.  Skin:    General: Skin is warm and dry.     Findings: No erythema or rash.  Neurological:     Mental Status: She is alert and oriented to person, place, and time.  Psychiatric:        Behavior: Behavior normal.        Thought Content: Thought content normal.        Judgment: Judgment normal.     Lab Results  Component Value Date   WBC 9.1 03/23/2022   HGB 13.4 03/23/2022   HCT 41.4 03/23/2022   MCV 94.1 03/23/2022   PLT 268 03/23/2022     Chemistry      Component Value Date/Time   NA 143 03/23/2022 1506   NA 141 02/23/2021 1507   K 3.4 (L) 03/23/2022 1506   CL 105 03/23/2022 1506   CO2 26 03/23/2022 1506   BUN 20 03/23/2022 1506   BUN 15 02/23/2021 1507   CREATININE 1.02 (H) 03/23/2022 1506      Component Value Date/Time   CALCIUM 10.0 03/23/2022 1506   ALKPHOS 57 03/23/2022 1506   AST 16 03/23/2022 1506   ALT 12 03/23/2022 1506   BILITOT 0.5 03/23/2022 1506       Impression and Plan: Ms. Skarda is a very charming 87 year old white female.  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  This probably was about 10 years ago.  She had a  lumpectomy for this.  She did not recall receiving any adjuvant therapy.  She now has a new primary in the right breast.  She underwent a lumpectomy.  This was a very small primary.  She did not need any radiation therapy.  She did not need any chemotherapy or anti-HER2  therapy.  I just think that Femara would be reasonable for her.  I think the big news is that she will be moving down to the [place] area.  A son is going to be moving to one of the suburbs.  He wants her to live with her.  She will move down there, probably sometime in February.  I do not see any problems with respect to recurrent breast cancer.  I think the chance of breast cancer recurrence is probably can be less than 10%.  Will have her come back to see [place] in May.  Will try to keep her appointments to every 6 months after that.   [name], MD 12/21/20233:55 PM

## 2022-03-29 ENCOUNTER — Ambulatory Visit (INDEPENDENT_AMBULATORY_CARE_PROVIDER_SITE_OTHER): Payer: Medicare (Managed Care) | Admitting: Family Medicine

## 2022-03-29 ENCOUNTER — Encounter: Payer: Self-pay | Admitting: Family Medicine

## 2022-03-29 VITALS — BP 144/85 | HR 88 | Wt 251.3 lb

## 2022-03-29 DIAGNOSIS — M159 Polyosteoarthritis, unspecified: Secondary | ICD-10-CM

## 2022-03-29 DIAGNOSIS — N3941 Urge incontinence: Secondary | ICD-10-CM

## 2022-03-29 DIAGNOSIS — I10 Essential (primary) hypertension: Secondary | ICD-10-CM

## 2022-03-29 DIAGNOSIS — Z23 Encounter for immunization: Secondary | ICD-10-CM | POA: Diagnosis not present

## 2022-03-29 DIAGNOSIS — Z0000 Encounter for general adult medical examination without abnormal findings: Secondary | ICD-10-CM | POA: Diagnosis not present

## 2022-03-29 MED ORDER — DICLOFENAC SODIUM 1 % EX GEL: 4.0000 g | Freq: Four times a day (QID) | CUTANEOUS | 6 refills | Status: AC

## 2022-03-29 MED ORDER — DIPHENHYDRAMINE HCL 25 MG PO CAPS: 25.0000 mg | ORAL_CAPSULE | Freq: Four times a day (QID) | ORAL | 0 refills | Status: AC | PRN

## 2022-03-29 NOTE — Progress Notes (Unsigned)
I,[name],acting as a Education administrator for [employer], MD.,have documented all relevant documentation on the behalf of Lisa Foster, MD,as directed by  Lisa Foster, MD while in the presence of Lisa Foster, MD.   Complete physical exam   Patient: Lisa Paul   DOB: 1934-11-05   87 y.o. Female  MRN: [id number] Visit Date: 03/29/2022  Today's healthcare provider: [name], MD   No chief complaint on file.  Subjective    Lisa Paul is a 87 y.o. female who presents today for a complete physical exam.  She reports consuming a general diet. The patient does not participate in regular exercise at present. She generally feels well. She reports sleeping well. She does have additional problems to discuss today.  HPI    Shingles: Advised can receive at pharmacy  Urinary Incontinence  -Patient reports incontinence and is no longer taking myrbetric, she states that her symptoms were no different with the medication than without   -patient reports using miralax to help soften stools due to constipation  Social Hx  She is planning to move to [place] to be with her son who has an apt on the first floor of his home ready for her  Past Medical History:  Diagnosis Date   Allergy    [place] ([place]) 2012   Left Breast with lumpectomy and mammosite   Cataract    Colon polyp 2005   Dyspnea    GERD (gastroesophageal reflux disease)    Goals of care, counseling/discussion 10/29/2020   Hypertension 15 years   Incontinence of urine    Lower extremity edema    Malignant neoplasm of breast (female), unspecified site    left breast   Neuropathy    Personal history of radiation therapy 2012   mammosite   Sleep apnea    Stage 1 breast cancer, ER+, right ([place]) 10/29/2020   Past Surgical History:  Procedure Laterality Date   ABDOMINAL HYSTERECTOMY  30 years ago   BREAST BIOPSY Left 2012    invasive mammary carcinoma   BREAST BIOPSY Right 10/19/2020   US biopsy/ path pending   BREAST LUMPECTOMY Left 2012   CARPAL TUNNEL RELEASE Bilateral    COLONOSCOPY  05/2003   Dr Nicolasa Ducking [facility]   COLONOSCOPY  07/24/2012   EYE SURGERY Bilateral 2005   cataract   JOINT REPLACEMENT Bilateral 2007   knees   mammosite balloon placement Left 08/2010   Removal 09/2010   TONSILLECTOMY  age 85   TOTAL HIP ARTHROPLASTY Right 10/03/2017   Procedure: [place];  Surgeon: Lovell Sheehan, MD;  Location: [facility] ORS;  Service: Orthopedics;  Laterality: Right;   Social History   Socioeconomic History   Marital status: Widowed    Spouse name: Not on file   Number of children: 2   Years of education: Not on file   Highest education level: Master's degree (e.g., MA, MS, MEng, MEd, MSW, MBA)  Occupational History   Occupation: retired  Tobacco Use   Smoking status: Never   Smokeless tobacco: Never  Vaping Use   Vaping Use: Never used  Substance and Sexual Activity   Alcohol use: No   Drug use: No   Sexual activity: Never  Other Topics Concern   Not on file  Social History Narrative   Not on file   Social Determinants of Health   Financial Resource Strain: Low Risk  (08/03/2021)   Overall Financial  Resource Strain (CARDIA)    Difficulty of Paying Living Expenses: Not hard at all  Food Insecurity: No Food Insecurity (08/03/2021)   Hunger Vital Sign    Worried About Running Out of Food in the Last Year: Never true    Ran Out of Food in the Last Year: Never true  Transportation Needs: No Transportation Needs (08/03/2021)   PRAPARE - Hydrologist (Medical): No    Lack of Transportation (Non-Medical): No  Physical Activity: Insufficiently Active (08/03/2021)   Exercise Vital Sign    Days of Exercise per Week: 3 days    Minutes of Exercise per Session: 30 min  Stress: No Stress Concern Present (08/03/2021)   [place]    Feeling of Stress : Only a little  Social Connections: Socially Isolated (08/03/2021)   Social Connection and Isolation Panel [NHANES]    Frequency of Communication with Friends and Family: More than three times a week    Frequency of Social Gatherings with Friends and Family: More than three times a week    Attends Religious Services: Never    Marine scientist or Organizations: No    Attends Archivist Meetings: Never    Marital Status: Widowed  Intimate Partner Violence: Not At Risk (08/03/2021)   Humiliation, Afraid, Rape, and Kick questionnaire    Fear of Current or Ex-Partner: No    Emotionally Abused: No    Physically Abused: No    Sexually Abused: No   Family Status  Relation Name Status   Mother  Deceased   Father  Deceased       unknown causes.   Brother  Alive   Brother  Deceased   Other Husband Deceased       paraplegic after stretching esophagus   Mat Aunt  (Not Specified)   Neg Hx  (Not Specified)   Family History  Problem Relation Age of Onset   Colon cancer Mother    Stroke Brother        possibly. Pt is unsure.   Breast cancer Maternal Aunt    Prostate cancer Neg Hx    Bladder Cancer Neg Hx    Kidney cancer Neg Hx    Allergies  Allergen Reactions   Tape Other (See Comments)    Paper tape - blisters Use Paper tape    Patient Care Team: Jerrol Banana., MD as PCP - General (Unknown Physician Specialty) Estill Cotta, MD as Consulting Physician (Ophthalmology) Clyde Canterbury, MD as Referring Physician (Otolaryngology) Ralene Bathe, MD as Consulting Physician (Dermatology) [name], MD as Consulting Physician (Oncology)   Medications: Outpatient Medications Prior to Visit  Medication Sig   acetaminophen (TYLENOL) 500 MG tablet Take 2 tablets (1,000 mg total) by mouth every 8 (eight) hours as needed. (Patient taking differently: Take 500 mg by mouth every 8 (eight) hours  as needed (for pain. (with tramadol)).)   Cyanocobalamin (B-12) 1000 MCG TABS Take 1 tablet by mouth daily. (Patient taking differently: Take 1,000 mcg by mouth daily.)   famotidine (PEPCID) 20 MG tablet TAKE 1 TABLET BY MOUTH EVERY DAY IN THE MORNING   fluticasone (FLONASE) 50 MCG/ACT nasal spray SPRAY 2 SPRAYS INTO EACH NOSTRIL EVERY DAY   guaiFENesin (MUCINEX) 600 MG 12 hr tablet Take 600 mg by mouth 2 (two) times daily. As needed for runny eyes   hydrochlorothiazide (HYDRODIURIL) 25 MG tablet TAKE 1 TABLET BY MOUTH  EVERY DAY   letrozole (FEMARA) 2.5 MG tablet TAKE 1 TABLET BY MOUTH EVERY DAY   losartan-hydrochlorothiazide (HYZAAR) 100-25 MG tablet TAKE 1 TABLET BY MOUTH EVERY DAY   multivitamin (VIT W/EXTRA C) CHEW chewable tablet Chew by mouth.   Polyethyl Glycol-Propyl Glycol 0.4-0.3 % SOLN Place 1 drop into both eyes 2 (two) times daily as needed (for dry eyes.).   [DISCONTINUED] amoxicillin (AMOXIL) 500 MG tablet Take 1 tablet (500 mg total) by mouth as directed. 4 tablets prior to dental procedures (Patient not taking: Reported on 11/07/2021)   [DISCONTINUED] diphenhydrAMINE (BENADRYL) 25 mg capsule Take 25 mg by mouth every 6 (six) hours as needed. Equate brand - taking @@ night for runny eyes (Patient not taking: Reported on 11/07/2021)   No facility-administered medications prior to visit.    Review of Systems  {Labs  Heme  Chem  Endocrine  Serology  Results Review (optional):23779}  Objective    BP (!) 144/85 (BP Location: Left Arm, Patient Position: Sitting, Cuff Size: Normal)   Pulse 88   Wt 251 lb 4.8 oz (114 kg)   BMI 40.56 kg/m  {Show previous vital signs (optional):23777}   Physical Exam Vitals reviewed.  Constitutional:      General: She is not in acute distress.    Appearance: Normal appearance. She is not ill-appearing, toxic-appearing or diaphoretic.  HENT:     Head: Normocephalic and atraumatic.     Right Ear: Tympanic membrane and external ear normal.  There is no impacted cerumen.     Left Ear: Tympanic membrane and external ear normal. There is no impacted cerumen.     Nose: Nose normal.     Mouth/Throat:     Pharynx: Oropharynx is clear.  Eyes:     General: No scleral icterus.    Extraocular Movements: Extraocular movements intact.     Conjunctiva/sclera: Conjunctivae normal.     Pupils: Pupils are equal, round, and reactive to light.  Cardiovascular:     Rate and Rhythm: Normal rate and regular rhythm.     Pulses: Normal pulses.     Heart sounds: Normal heart sounds. No murmur heard.    No friction rub. No gallop.  Pulmonary:     Effort: Pulmonary effort is normal. No respiratory distress.     Breath sounds: Normal breath sounds. No wheezing, rhonchi or rales.  Abdominal:     General: Bowel sounds are normal. There is no distension.     Palpations: Abdomen is soft. There is no mass.     Tenderness: There is no abdominal tenderness. There is no guarding.  Musculoskeletal:        General: No deformity.     Cervical back: Normal range of motion and neck supple. No rigidity.     Right lower leg: No edema.     Left lower leg: No edema.  Lymphadenopathy:     Cervical: No cervical adenopathy.  Skin:    General: Skin is warm.     Capillary Refill: Capillary refill takes less than 2 seconds.     Findings: No erythema or rash.  Neurological:     General: No focal deficit present.     Mental Status: She is alert and oriented to person, place, and time.     Motor: No weakness.     Gait: Gait normal.  Psychiatric:        Mood and Affect: Mood normal.        Behavior: Behavior normal.  Last depression screening scores    08/03/2021    9:44 AM 02/23/2021    2:22 PM 12/31/2020    4:52 PM  PHQ 2/9 Scores  PHQ - 2 Score 1 0 0  PHQ- 9 Score  1    Last fall risk screening    08/03/2021    9:47 AM  Fall Risk   Falls in the past year? 0  Number falls in past yr: 0  Injury with Fall? 0  Risk for fall due to : No Fall  Risks  Follow up Falls evaluation completed   Last Audit-C alcohol use screening    08/03/2021    9:43 AM  Alcohol Use Disorder Test (AUDIT)  1. How often do you have a drink containing alcohol? 0  2. How many drinks containing alcohol do you have on a typical day when you are drinking? 0  3. How often do you have six or more drinks on one occasion? 0  AUDIT-C Score 0   A score of 3 or more in women, and 4 or more in men indicates increased risk for alcohol abuse, EXCEPT if all of the points are from question 1   No results found for any visits on 03/29/22.  Assessment & Plan    Routine Health Maintenance and Physical Exam  Exercise Activities and Dietary recommendations  Goals      DIET - EAT MORE FRUITS AND VEGETABLES     Exercise      Starting in spring 2018, I will start exercising at least 3 days a week.        Immunization History  Administered Date(s) Administered   Fluad Quad(high Dose 65+) 01/14/2019, 12/11/2019, 02/23/2021   Influenza, High Dose Seasonal PF 02/18/2015, 04/28/2016, 01/24/2017, 01/23/2018   Influenza-Unspecified 01/01/2013   PFIZER Comirnaty(Gray Top)Covid-19 Tri-Sucrose Vaccine 10/27/2020   PFIZER(Purple Top)SARS-COV-2 Vaccination 05/15/2019, 03/12/2020   Pfizer Covid-19 Vaccine Bivalent Booster 28yr & up 05/26/2021   Pneumococcal Conjugate-13 05/07/2014   Pneumococcal Polysaccharide-23 02/15/1998, 07/11/2011   Td 03/12/2003   Tdap 05/07/2014   Zoster, Live 06/08/2007    Health Maintenance  Topic Date Due   Zoster Vaccines- Shingrix (1 of 2) Never done   INFLUENZA VACCINE  11/01/2021   COVID-19 Vaccine (5 - 2023-24 season) 12/02/2021   Medicare Annual Wellness (AWV)  08/04/2022   DTaP/Tdap/Td (3 - Td or Tdap) 05/07/2024   Pneumonia Vaccine 85 Years old  Completed   DEXA SCAN  Completed   HPV VACCINES  Aged Out    Discussed health benefits of physical activity, and encouraged her to engage in regular exercise appropriate for her age  and condition.  Problem List Items Addressed This Visit   None    Return in about 4 months (around 07/29/2022) for HTN follow up .    I, MEulis Foster MD, have reviewed all documentation for this visit.  Portions of this information were initially documented by the CMA and reviewed by me for thoroughness and accuracy.      MEulis Foster MD  [phone](phone) [phone](fax)  [place]

## 2022-03-29 NOTE — Patient Instructions (Signed)
I have sent a prescription for voltaren gel to help with you knee pain.    Please continue your medications as prescribed    Please plan to follow up in 4 months

## 2022-03-30 DIAGNOSIS — Z23 Encounter for immunization: Secondary | ICD-10-CM | POA: Insufficient documentation

## 2022-03-30 DIAGNOSIS — Z0000 Encounter for general adult medical examination without abnormal findings: Secondary | ICD-10-CM | POA: Insufficient documentation

## 2022-03-30 NOTE — Assessment & Plan Note (Signed)
Intermittent  Recommended voltaren gel, prescribed today

## 2022-03-30 NOTE — Assessment & Plan Note (Signed)
Improved, stable  Patient reports symptoms controlled without medication at this time and no longer needed incontinence underwear  Discussed ok to use medication if symptoms become bothersome in the future

## 2022-03-30 NOTE — Assessment & Plan Note (Signed)
Discussed recommendation for Shingrix vaccine

## 2022-03-30 NOTE — Assessment & Plan Note (Signed)
Influenza vaccine was administered today  Patient tolerated well   

## 2022-03-30 NOTE — Assessment & Plan Note (Signed)
BP elevated in office today, asymptomatic  Recommended continue home BP medications  Follow up in four months

## 2022-04-03 ENCOUNTER — Other Ambulatory Visit: Payer: Self-pay | Admitting: Hematology & Oncology

## 2022-07-04 ENCOUNTER — Telehealth: Payer: Self-pay | Admitting: Family Medicine

## 2022-07-04 NOTE — Telephone Encounter (Signed)
The son called in stating and requesting the patient requires an antibiotic each time she gets a dental procedure done. She was last prescribed Amoxicillin last year sometime but it does not have any refills left. She needs to get a crown on Thursday so he really needs to pick it up tomorrow if possible. She uses  [id number] - [place], [place] Phone: [phone]  Fax: [phone]     Please assist patient further

## 2022-07-05 MED ORDER — AMOXICILLIN 500 MG PO CAPS: ORAL_CAPSULE | ORAL | 1 refills | Status: AC

## 2022-07-05 NOTE — Telephone Encounter (Signed)
Amoxicillin Rx has been sent to patient's pharmacy, 8 tablets and 1 refill   Please notify patient  [name], MD  [facility]

## 2022-07-05 NOTE — Telephone Encounter (Signed)
Patient's son Dariella Columbo on [place] advised

## 2022-07-13 ENCOUNTER — Other Ambulatory Visit: Payer: Self-pay | Admitting: Family Medicine

## 2022-07-13 DIAGNOSIS — J309 Allergic rhinitis, unspecified: Secondary | ICD-10-CM

## 2022-07-28 NOTE — Progress Notes (Deleted)
Established patient visit   Patient: Lisa Paul   DOB: Nov 02, 1934   88 y.o. Female  MRN: [id number] Visit Date: 07/31/2022  Today's healthcare provider: Ronnald Ramp, MD   No chief complaint on file.  Subjective    HPI  Hypertension, follow-up  BP Readings from Last 3 Encounters:  03/29/22 (!) 144/85  03/23/22 (!) 154/71  11/07/21 (!) 163/70   Wt Readings from Last 3 Encounters:  03/29/22 251 lb 4.8 oz (114 kg)  03/23/22 247 lb (112 kg)  11/07/21 251 lb 6.4 oz (114 kg)     She was last seen for hypertension 4 months ago.  BP at that visit was 144/85. Management since that visit includes Recommended continue home BP medications .  She reports {excellent/good/fair/poor:19665} compliance with treatment. She {is/is not:9024} having side effects. {document side effects if present:1}   Outside blood pressures are {***enter patient reported home BP readings, or 'not being checked':1}. Symptoms: {Yes/No:20286} chest pain {Yes/No:20286} chest pressure  {Yes/No:20286} palpitations {Yes/No:20286} syncope  {Yes/No:20286} dyspnea {Yes/No:20286} orthopnea  {Yes/No:20286} paroxysmal nocturnal dyspnea {Yes/No:20286} lower extremity edema   Pertinent labs Lab Results  Component Value Date   CHOL 206 (H) 12/11/2019   HDL 48 12/11/2019   LDLCALC 137 (H) 12/11/2019   TRIG 116 12/11/2019   CHOLHDL 4.3 12/11/2019   Lab Results  Component Value Date   NA 143 03/23/2022   K 3.4 (L) 03/23/2022   CREATININE 1.02 (H) 03/23/2022   GFRNONAA 54 (L) 03/23/2022   GLUCOSE 103 (H) 03/23/2022   TSH 1.470 02/23/2021     The ASCVD Risk score (Arnett DK, et al., 2019) failed to calculate for the following reasons:   The 2019 ASCVD risk score is only valid for ages 55 to 14  ---------------------------------------------------------------------------------------------------   Medications: Outpatient Medications Prior to Visit  Medication Sig   acetaminophen (TYLENOL)  500 MG tablet Take 2 tablets (1,000 mg total) by mouth every 8 (eight) hours as needed. (Patient taking differently: Take 500 mg by mouth every 8 (eight) hours as needed (for pain. (with tramadol)).)   amoxicillin (AMOXIL) 500 MG capsule Take 4 tablets prior to dental procedures   Cyanocobalamin (B-12) 1000 MCG TABS Take 1 tablet by mouth daily. (Patient taking differently: Take 1,000 mcg by mouth daily.)   diclofenac Sodium (VOLTAREN ARTHRITIS PAIN) 1 % GEL Apply 4 g topically 4 (four) times daily.   diphenhydrAMINE (BENADRYL) 25 mg capsule Take 1 capsule (25 mg total) by mouth every 6 (six) hours as needed. Equate brand - taking @@ night for runny eyes   famotidine (PEPCID) 20 MG tablet TAKE 1 TABLET BY MOUTH EVERY DAY IN THE MORNING   fluticasone (FLONASE) 50 MCG/ACT nasal spray SPRAY 2 SPRAYS INTO EACH NOSTRIL EVERY DAY   guaiFENesin (MUCINEX) 600 MG 12 hr tablet Take 600 mg by mouth 2 (two) times daily. As needed for runny eyes   hydrochlorothiazide (HYDRODIURIL) 25 MG tablet TAKE 1 TABLET BY MOUTH EVERY DAY   letrozole (FEMARA) 2.5 MG tablet TAKE 1 TABLET BY MOUTH EVERY DAY   losartan-hydrochlorothiazide (HYZAAR) 100-25 MG tablet TAKE 1 TABLET BY MOUTH EVERY DAY   multivitamin (VIT W/EXTRA C) CHEW chewable tablet Chew by mouth.   Polyethyl Glycol-Propyl Glycol 0.4-0.3 % SOLN Place 1 drop into both eyes 2 (two) times daily as needed (for dry eyes.).   No facility-administered medications prior to visit.    Review of Systems  {Labs  Heme  Chem  Endocrine  Serology  Results Review (optional):23779}   Objective    There were no vitals taken for this visit. {Show previous vital signs (optional):23777}  Physical Exam  ***  No results found for any visits on 07/31/22.  Assessment & Plan     ***  No follow-ups on file.      {provider attestation***:1}   Ronnald Ramp, MD  [facility] [phone] (phone) [phone] (fax)  [facility]

## 2022-07-31 ENCOUNTER — Ambulatory Visit: Payer: Medicare (Managed Care) | Admitting: Family Medicine

## 2022-08-24 ENCOUNTER — Inpatient Hospital Stay (HOSPITAL_BASED_OUTPATIENT_CLINIC_OR_DEPARTMENT_OTHER): Payer: Medicare (Managed Care) | Admitting: Medical Oncology

## 2022-08-24 ENCOUNTER — Inpatient Hospital Stay: Payer: Medicare (Managed Care) | Attending: Hematology & Oncology

## 2022-08-24 ENCOUNTER — Encounter: Payer: Self-pay | Admitting: Medical Oncology

## 2022-08-24 VITALS — BP 119/62 | HR 82 | Temp 98.5°F | Resp 19 | Wt 242.0 lb

## 2022-08-24 DIAGNOSIS — C50911 Malignant neoplasm of unspecified site of right female breast: Secondary | ICD-10-CM | POA: Diagnosis not present

## 2022-08-24 DIAGNOSIS — Z79811 Long term (current) use of aromatase inhibitors: Secondary | ICD-10-CM | POA: Diagnosis not present

## 2022-08-24 DIAGNOSIS — Z170 Estrogen receptor positive status [ER+]: Secondary | ICD-10-CM | POA: Insufficient documentation

## 2022-08-24 LAB — CBC WITH DIFFERENTIAL (CANCER CENTER ONLY)
Abs Immature Granulocytes: 0.02 10*3/uL (ref 0.00–0.07)
Basophils Absolute: 0.1 10*3/uL (ref 0.0–0.1)
Basophils Relative: 1 %
Eosinophils Absolute: 0.2 10*3/uL (ref 0.0–0.5)
Eosinophils Relative: 3 %
HCT: 42.6 % (ref 36.0–46.0)
Hemoglobin: 13.7 g/dL (ref 12.0–15.0)
Immature Granulocytes: 0 %
Lymphocytes Relative: 29 %
Lymphs Abs: 2.5 10*3/uL (ref 0.7–4.0)
MCH: 29.9 pg (ref 26.0–34.0)
MCHC: 32.2 g/dL (ref 30.0–36.0)
MCV: 93 fL (ref 80.0–100.0)
Monocytes Absolute: 0.9 10*3/uL (ref 0.1–1.0)
Monocytes Relative: 10 %
Neutro Abs: 5 10*3/uL (ref 1.7–7.7)
Neutrophils Relative %: 57 %
Platelet Count: 284 10*3/uL (ref 150–400)
RBC: 4.58 MIL/uL (ref 3.87–5.11)
RDW: 13.2 % (ref 11.5–15.5)
WBC Count: 8.7 10*3/uL (ref 4.0–10.5)
nRBC: 0 % (ref 0.0–0.2)

## 2022-08-24 LAB — CMP (CANCER CENTER ONLY)
ALT: 12 U/L (ref 0–44)
AST: 16 U/L (ref 15–41)
Albumin: 4.2 g/dL (ref 3.5–5.0)
Alkaline Phosphatase: 86 U/L (ref 38–126)
Anion gap: 10 (ref 5–15)
BUN: 18 mg/dL (ref 8–23)
CO2: 24 mmol/L (ref 22–32)
Calcium: 10.3 mg/dL (ref 8.9–10.3)
Chloride: 107 mmol/L (ref 98–111)
Creatinine: 1.15 mg/dL — ABNORMAL HIGH (ref 0.44–1.00)
GFR, Estimated: 47 mL/min — ABNORMAL LOW (ref >60)
Glucose, Bld: 105 mg/dL — ABNORMAL HIGH (ref 70–99)
Potassium: 4 mmol/L (ref 3.5–5.1)
Sodium: 141 mmol/L (ref 135–145)
Total Bilirubin: 0.7 mg/dL (ref 0.3–1.2)
Total Protein: 7.1 g/dL (ref 6.5–8.1)

## 2022-08-24 LAB — LACTATE DEHYDROGENASE: LDH: 149 U/L (ref 98–192)

## 2022-08-24 NOTE — Progress Notes (Signed)
Hematology and Oncology Follow Up Visit  YULI BEEM [id number] May 15, 1934 88 y.o. 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  Current Therapy:   Right lumpectomy  on 12/27/2020 Femara 2.5 mg p.o. daily-start on 01/10/2021     Interim History:  Ms. Bonn is back for follow-up.  She is here with her son.   They state that she is moving to the [place] area soon and is going to be transitioning into assisted living.  The facility that they have in mind has an onsite physician that she is going to transfer her care to.  They report that she has been doing well.  They deny any unintentional weight loss, night sweats, intolerance of her letrozole or any breast concerns.  She is almost due for her mammogram (July 2024).   She has had no change in bowel or bladder habits.  She has had no cough or shortness of breath.  She has had no nausea or vomiting.  There is been no bleeding.  She has had no rashes.  Currently, I would say performance status is probably ECOG 1.  Wt Readings from Last 3 Encounters:  08/24/22 242 lb (109.8 kg)  03/29/22 251 lb 4.8 oz (114 kg)  03/23/22 247 lb (112 kg)     Medications:  Current Outpatient Medications:    acetaminophen (TYLENOL) 500 MG tablet, Take 2 tablets (1,000 mg total) by mouth every 8 (eight) hours as needed. (Patient taking differently: Take 500 mg by mouth every 8 (eight) hours as needed (for pain. (with tramadol)).), Disp: 30 tablet, Rfl: 0   amoxicillin (AMOXIL) 500 MG capsule, Take 4 tablets prior to dental procedures, Disp: 8 capsule, Rfl: 1   Cyanocobalamin (B-12) 1000 MCG TABS, Take 1 tablet by mouth daily. (Patient taking differently: Take 1,000 mcg by mouth daily.), Disp: 30 tablet, Rfl:    diclofenac Sodium (VOLTAREN ARTHRITIS PAIN) 1 % GEL, Apply 4 g topically 4 (four) times daily., Disp: 350 g, Rfl: 6   diphenhydrAMINE (BENADRYL) 25 mg capsule, Take 1  capsule (25 mg total) by mouth every 6 (six) hours as needed. Equate brand - taking @@ night for runny eyes, Disp: 30 capsule, Rfl: 0   famotidine (PEPCID) 20 MG tablet, TAKE 1 TABLET BY MOUTH EVERY DAY IN THE MORNING, Disp: 30 tablet, Rfl: 2   fluticasone (FLONASE) 50 MCG/ACT nasal spray, SPRAY 2 SPRAYS INTO EACH NOSTRIL EVERY DAY, Disp: 48 mL, Rfl: 1   guaiFENesin (MUCINEX) 600 MG 12 hr tablet, Take 600 mg by mouth 2 (two) times daily. As needed for runny eyes, Disp: , Rfl:    hydrochlorothiazide (HYDRODIURIL) 25 MG tablet, TAKE 1 TABLET BY MOUTH EVERY DAY, Disp: 90 tablet, Rfl: 1   letrozole (FEMARA) 2.5 MG tablet, TAKE 1 TABLET BY MOUTH EVERY DAY, Disp: 90 tablet, Rfl: 2   losartan-hydrochlorothiazide (HYZAAR) 100-25 MG tablet, TAKE 1 TABLET BY MOUTH EVERY DAY, Disp: 90 tablet, Rfl: 0   multivitamin (VIT W/EXTRA C) CHEW chewable tablet, Chew by mouth., Disp: , Rfl:    Polyethyl Glycol-Propyl Glycol 0.4-0.3 % SOLN, Place 1 drop into both eyes 2 (two) times daily as needed (for dry eyes.)., Disp: , Rfl:   Allergies:  Allergies  Allergen Reactions   Tape Other (See Comments)    Paper tape - blisters Use Paper tape    Past Medical History, Surgical history, Social history, and Family History were reviewed and updated.  Review of Systems:  Review of Systems  Constitutional: Negative.   HENT:  Negative.    Eyes: Negative.   Respiratory: Negative.    Cardiovascular: Negative.   Gastrointestinal: Negative.   Endocrine: Negative.   Genitourinary: Negative.    Musculoskeletal: Negative.   Skin: Negative.   Neurological: Negative.   Hematological: Negative.   Psychiatric/Behavioral: Negative.      Physical Exam:  weight is 242 lb (109.8 kg). Her oral temperature is 98.5 F (36.9 C). Her blood pressure is 119/62 and her pulse is 82. Her respiration is 19 and oxygen saturation is 97%.   Wt Readings from Last 3 Encounters:  08/24/22 242 lb (109.8 kg)  03/29/22 251 lb 4.8 oz (114 kg)   03/23/22 247 lb (112 kg)    Physical Exam Vitals reviewed.  Constitutional:      Comments: Limited exam requested by patient. Her breast exam show left breast with no masses, edema or erythema.  There is no left axillary adenopathy.  Right breast shows the healed lumpectomy at about the 8 o'clock position.  There is no erythema at the lumpectomy site  HENT:     Head: Normocephalic and atraumatic.  Eyes:     Pupils: Pupils are equal, round, and reactive to light.  Cardiovascular:     Rate and Rhythm: Normal rate and regular rhythm.     Heart sounds: Normal heart sounds.  Pulmonary:     Effort: Pulmonary effort is normal.     Breath sounds: Normal breath sounds.  Musculoskeletal:        General: No tenderness or deformity. Normal range of motion.     Cervical back: Normal range of motion.  Lymphadenopathy:     Cervical: No cervical adenopathy.  Skin:    General: Skin is warm and dry.     Findings: No erythema or rash.  Neurological:     Mental Status: She is alert and oriented to person, place, and time.  Psychiatric:        Behavior: Behavior normal.        Thought Content: Thought content normal.        Judgment: Judgment normal.      Lab Results  Component Value Date   WBC 8.7 08/24/2022   HGB 13.7 08/24/2022   HCT 42.6 08/24/2022   MCV 93.0 08/24/2022   PLT 284 08/24/2022     Chemistry      Component Value Date/Time   NA 141 08/24/2022 1248   NA 141 02/23/2021 1507   K 4.0 08/24/2022 1248   CL 107 08/24/2022 1248   CO2 24 08/24/2022 1248   BUN 18 08/24/2022 1248   BUN 15 02/23/2021 1507   CREATININE 1.15 (H) 08/24/2022 1248      Component Value Date/Time   CALCIUM 10.3 08/24/2022 1248   ALKPHOS 86 08/24/2022 1248   AST 16 08/24/2022 1248   ALT 12 08/24/2022 1248   BILITOT 0.7 08/24/2022 1248       Impression and Plan: Ms. Divita is a very charming 88 year old white female.  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  This  probably was about 10 years ago.  She had a lumpectomy for this.  She did not recall receiving any adjuvant therapy.  She now has a new primary in the right breast.  She underwent a lumpectomy.  This was a very small primary.  She did not need any radiation therapy.  She did not need any chemotherapy or  anti-HER2 therapy.  Dr. Rexene Edison has her on Femara which she is tolerating well. Low chance of recurrence.   At this time I have recommended that she continue her Femara at current dose.  It is for at least 5 more years of therapy.  Be due for her next mammogram in July.  At this time they elect to have all follow-ups scheduled but will likely cancel when she establishes with her new assisted living facility.   Disposition: RTC APP 6 months (no labs) Mammogram July 2024  Brand Males [place], [place] 5/23/20243:02 PM

## 2022-10-07 IMAGING — MG DIGITAL DIAGNOSTIC UNILAT RIGHT W/ CAD
4 series · 4 of 8 positions shown · non-contrast
Comparison: Previous exam(s).

CLINICAL DATA: Recall from screening mammography with
tomosynthesis, calcifications involving the OUTER RIGHT breast at
MIDDLE to POSTERIOR depth.

EXAM:
DIGITAL DIAGNOSTIC UNILATERAL RIGHT MAMMOGRAM WITH CAD AND TOMO

[R ML]
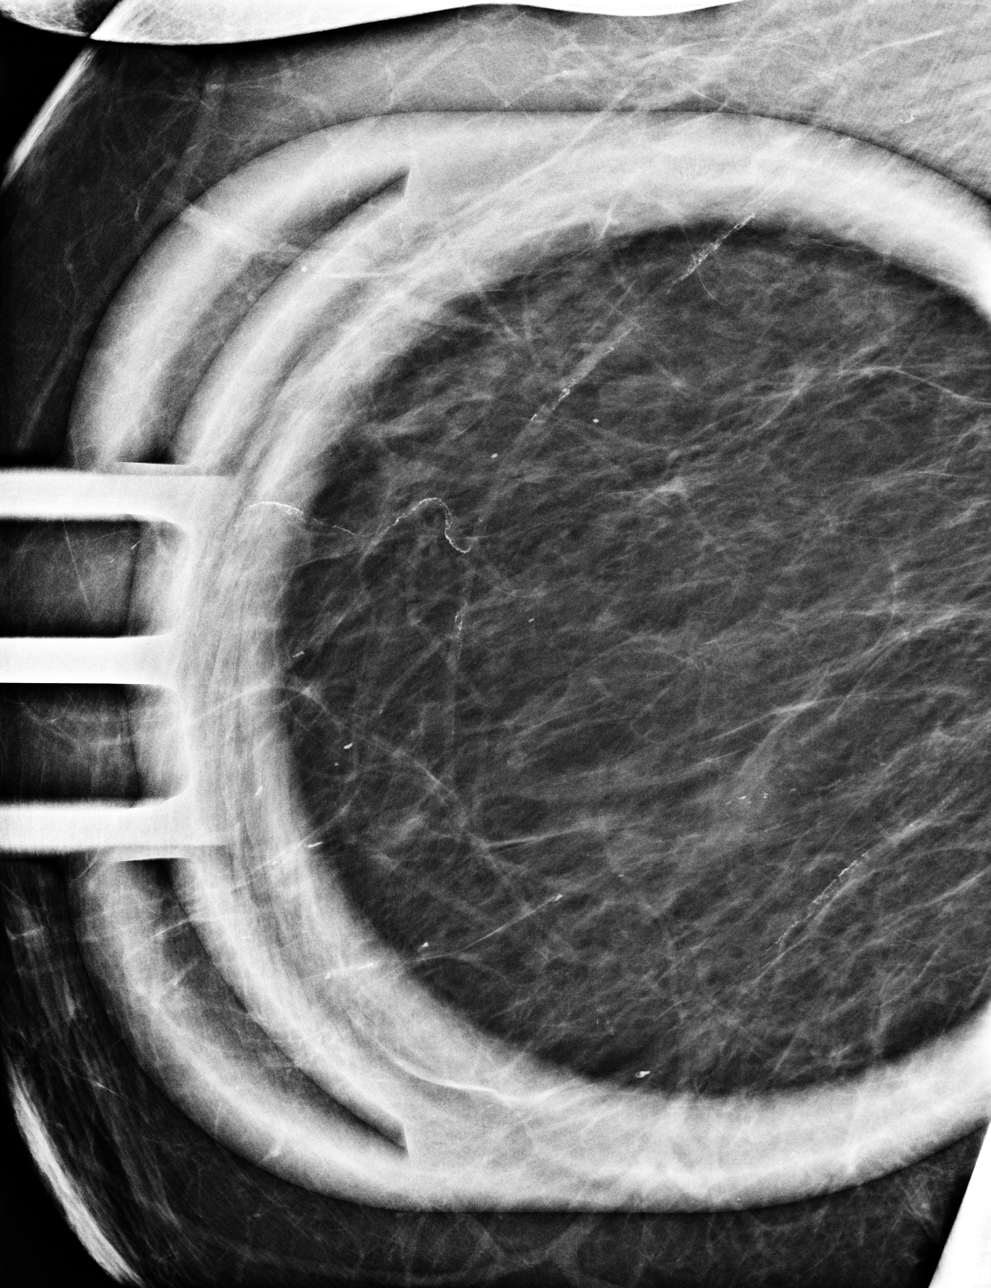

[R CC]
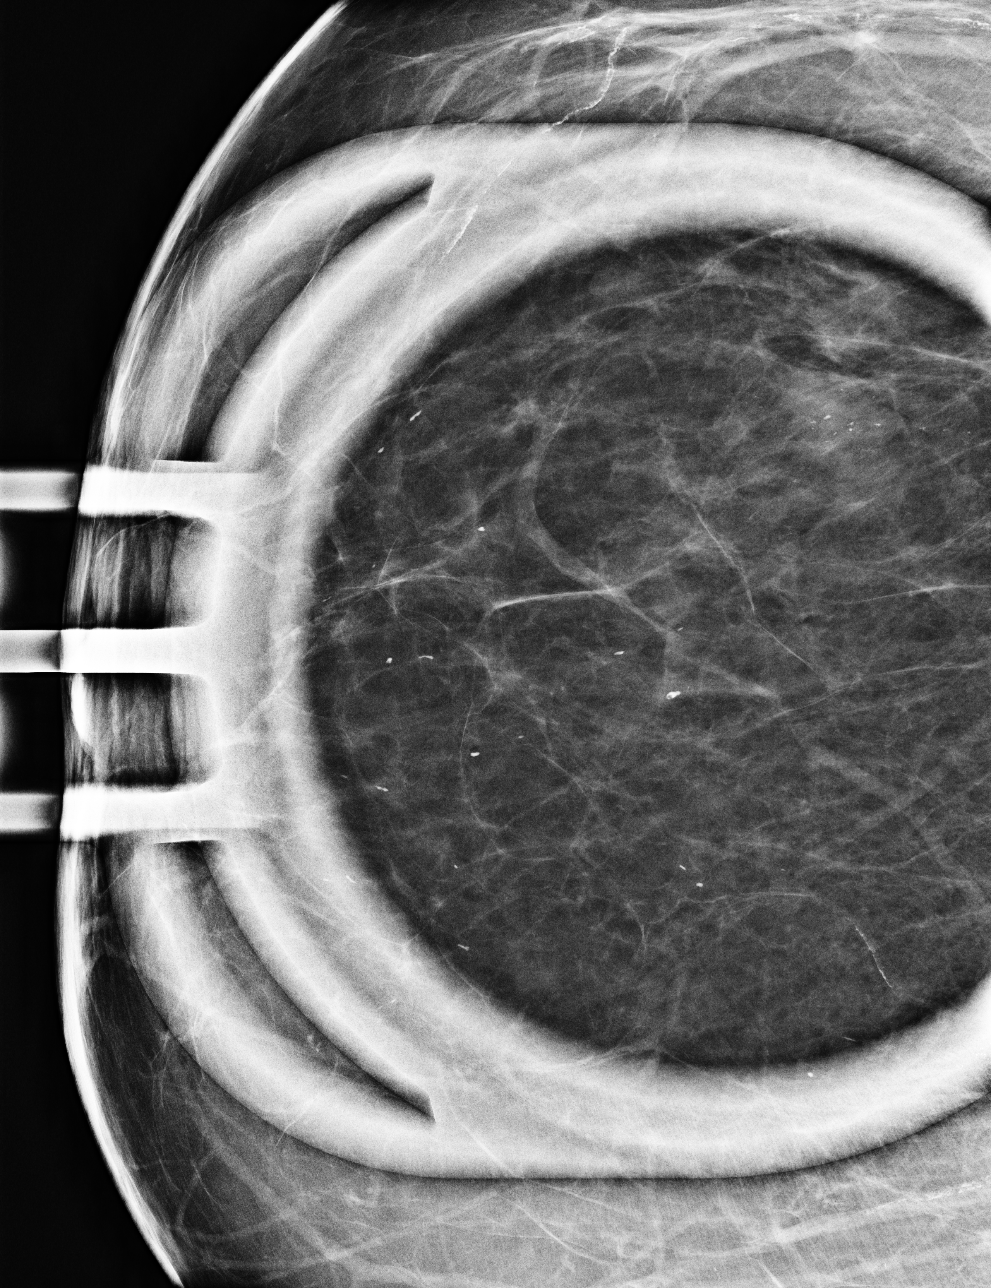

[R ML synth-2D]
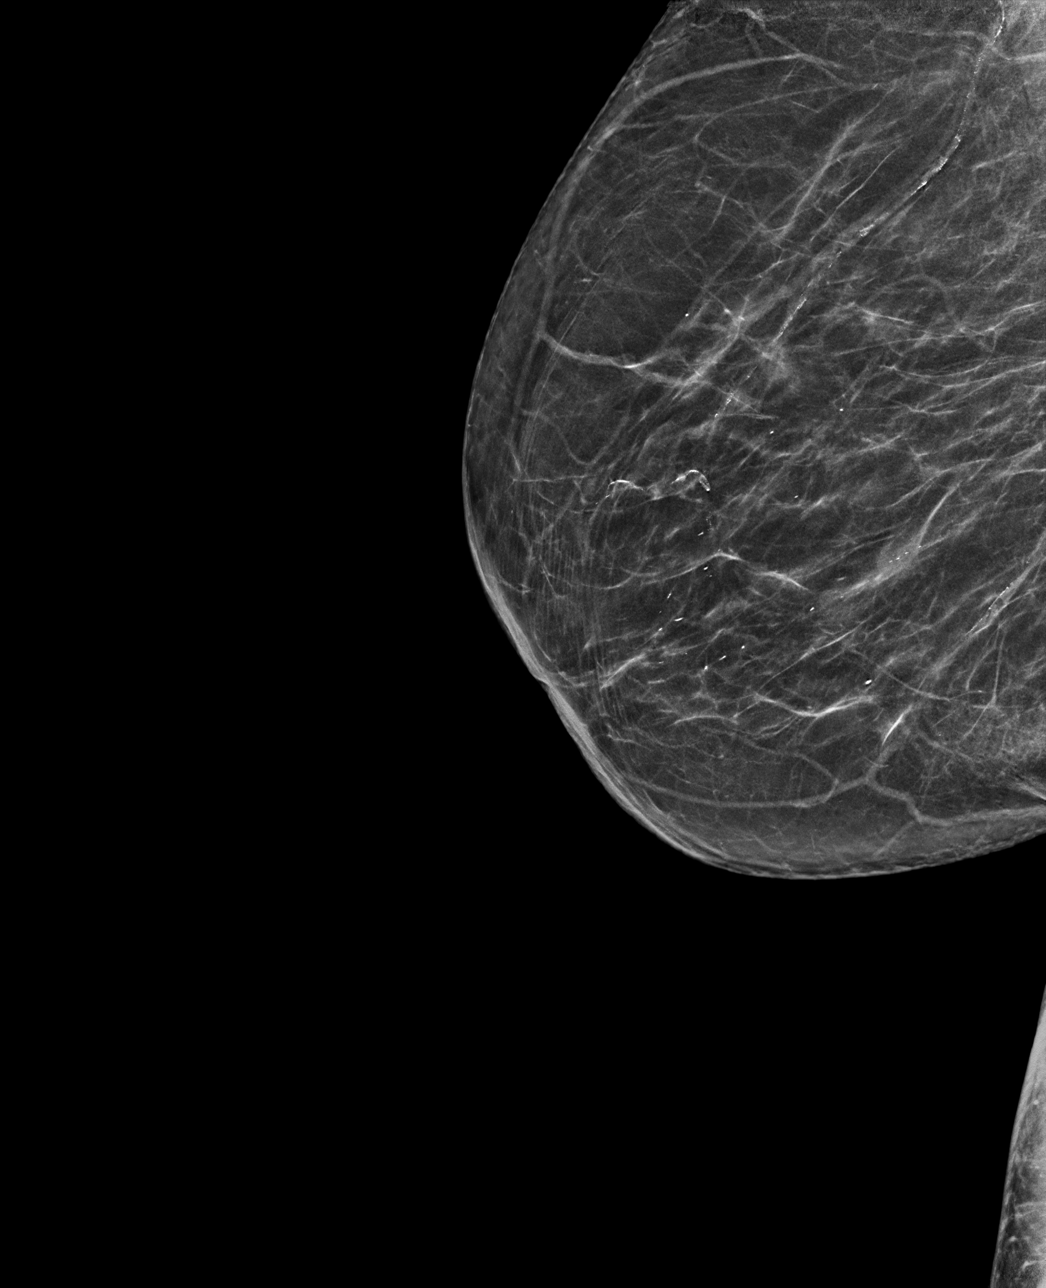

[R ML tomo · tomo slice 34/67.0]
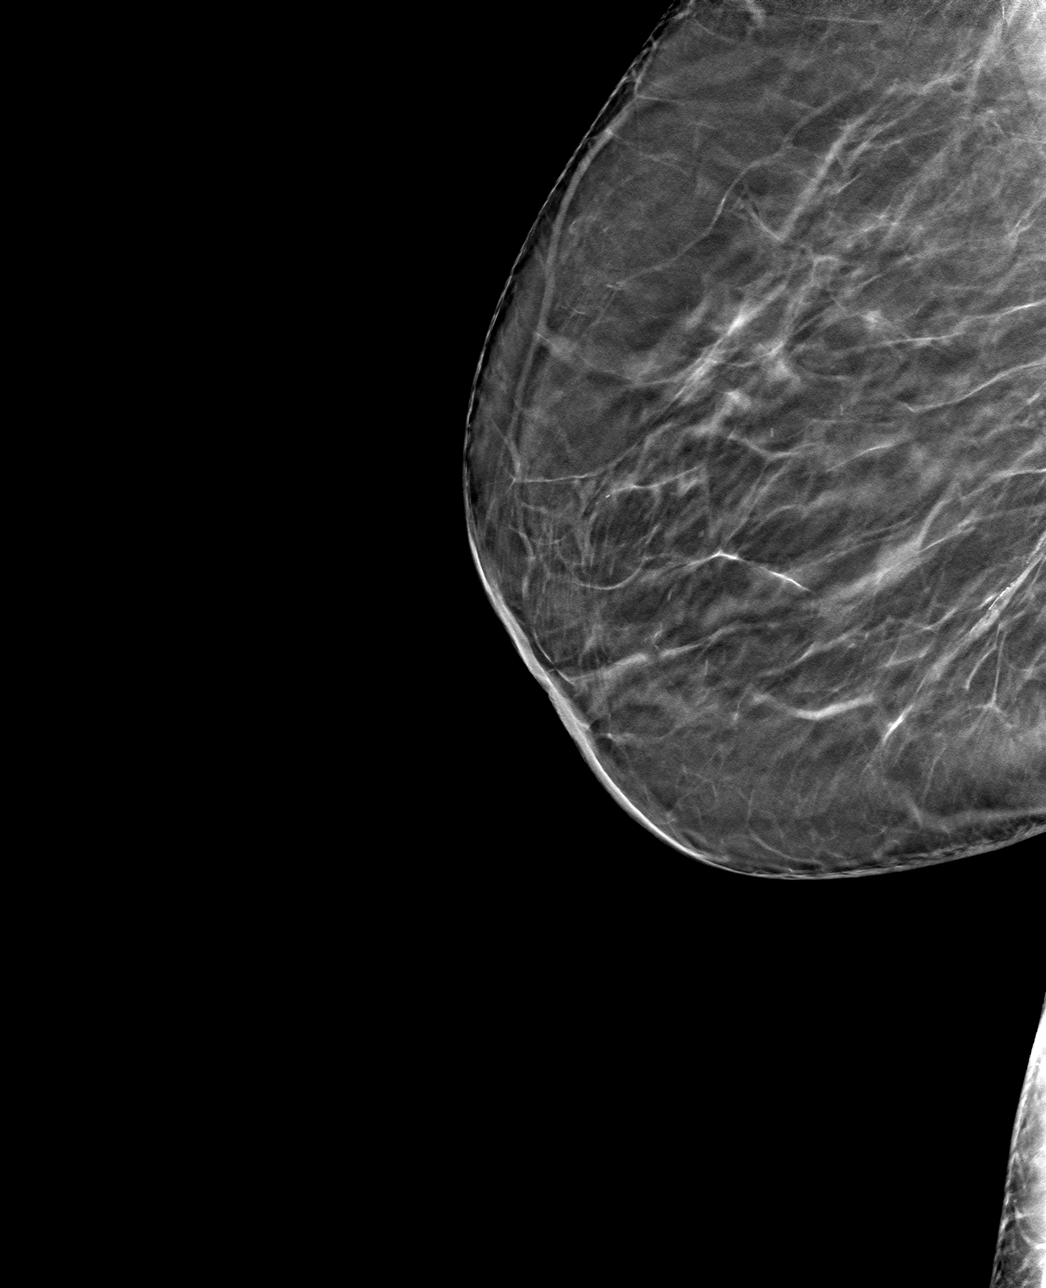

[4 of 8 positions shown; findings below may reference images not displayed]

ACR Breast Density Category b: There are scattered areas of
fibroglandular density.
FINDINGS: Digital 2D spot magnification CC and mediolateral views of the RIGHT
breast calcifications and a tomosynthesis and synthesized full field
mediolateral view of the RIGHT breast were obtained. The full field
mediolateral image was processed with CAD.

Spot magnification images confirm a 1.6 cm group of calcifications
in a linear orientation in the OUTER breast at MIDDLE to POSTERIOR
depth. There is no associated mass or architectural distortion.

There are benign coarse linear calcifications elsewhere throughout
both breasts on screening mammography. No suspicious findings
elsewhere on the full field mediolateral images.
IMPRESSION: Likely benign 1.6 cm group of developing secretory calcifications in
the OUTER RIGHT breast at MIDDLE to POSTERIOR depth, as there are
benign coarse linear secretory calcifications throughout both
breasts on screening mammography.

RECOMMENDATION:
Short-term interval follow-up and stereotactic core needle biopsy of
the RIGHT breast calcifications were discussed with the patient. At
this time, the patient elects short-term interval follow-up,
therefore, diagnostic RIGHT mammography in occluding spot
magnifications of the RIGHT breast calcifications is recommended in
6 months.

I counseled the patient that should she change her mind and wished
to proceed with biopsy, she can call the [HOSPITAL] and
the biopsy can be scheduled. She is in the process of moving,
possibly out of town. She wishes to discuss with her family whether
to proceed with biopsy now or continue with her decision for
short-term follow-up.

I have discussed the findings and recommendations with the patient.
If applicable, a reminder letter will be sent to the patient
regarding the next appointment.

BI-RADS CATEGORY  3: Probably benign.

## 2023-02-06 ENCOUNTER — Telehealth: Payer: Self-pay

## 2023-02-06 NOTE — Telephone Encounter (Signed)
Copied from CRM [id number]. Topic: Medicare AWV >> Feb 06, 2023 11:44 AM Macon Large wrote: Reason for CRM: Pt son reports that the pt lives in [place] now and requests that the Medicare AWV be done by phone so they do not have to make multiple trips for appts. Pt son Felicity Pellegrini requests to be contacted at [phone]

## 2023-02-06 NOTE — Telephone Encounter (Signed)
Lisa Paul advised

## 2023-02-06 NOTE — Telephone Encounter (Signed)
I don't see that she's scheduled with [facility]. She is on the scheduled to see Dr. Roxan Hockey 12/31 for CPE. Please Review

## 2023-02-06 NOTE — Telephone Encounter (Signed)
Patient will need office visit and labs for medication monitoring. Might be best to keep Dec appt scheduled as in person visit since she has not been seen in office since last Dec   If transportation is unavailable for 04/03/23 appt, she can be switched to [facility] for AWV and have follow up with me scheduled in Jan or Feb 2025 for labs and BP follow up    Ronnald Ramp, MD

## 2023-02-20 ENCOUNTER — Inpatient Hospital Stay: Payer: Medicare (Managed Care) | Admitting: Medical Oncology

## 2023-02-20 ENCOUNTER — Inpatient Hospital Stay: Payer: Medicare (Managed Care)

## 2023-02-28 ENCOUNTER — Inpatient Hospital Stay: Payer: Medicare (Managed Care) | Admitting: Medical Oncology

## 2023-02-28 ENCOUNTER — Inpatient Hospital Stay: Payer: Medicare (Managed Care)

## 2023-03-16 ENCOUNTER — Other Ambulatory Visit: Payer: Self-pay

## 2023-03-16 DIAGNOSIS — C50911 Malignant neoplasm of unspecified site of right female breast: Secondary | ICD-10-CM

## 2023-03-19 ENCOUNTER — Encounter: Payer: Self-pay | Admitting: Medical Oncology

## 2023-03-19 ENCOUNTER — Inpatient Hospital Stay: Payer: Medicare (Managed Care)

## 2023-03-19 ENCOUNTER — Inpatient Hospital Stay: Payer: Medicare (Managed Care) | Attending: Medical Oncology | Admitting: Medical Oncology

## 2023-03-19 VITALS — BP 156/51 | HR 73 | Temp 98.4°F | Resp 19 | Ht 66.0 in | Wt 245.1 lb

## 2023-03-19 DIAGNOSIS — Z853 Personal history of malignant neoplasm of breast: Secondary | ICD-10-CM | POA: Diagnosis not present

## 2023-03-19 DIAGNOSIS — C50911 Malignant neoplasm of unspecified site of right female breast: Secondary | ICD-10-CM | POA: Diagnosis not present

## 2023-03-19 DIAGNOSIS — Z79811 Long term (current) use of aromatase inhibitors: Secondary | ICD-10-CM | POA: Diagnosis not present

## 2023-03-19 DIAGNOSIS — R011 Cardiac murmur, unspecified: Secondary | ICD-10-CM | POA: Diagnosis not present

## 2023-03-19 DIAGNOSIS — Z170 Estrogen receptor positive status [ER+]: Secondary | ICD-10-CM | POA: Insufficient documentation

## 2023-03-19 LAB — CBC WITH DIFFERENTIAL (CANCER CENTER ONLY)
Abs Immature Granulocytes: 0.06 10*3/uL (ref 0.00–0.07)
Basophils Absolute: 0.1 10*3/uL (ref 0.0–0.1)
Basophils Relative: 1 %
Eosinophils Absolute: 0.3 10*3/uL (ref 0.0–0.5)
Eosinophils Relative: 3 %
HCT: 41.1 % (ref 36.0–46.0)
Hemoglobin: 13.2 g/dL (ref 12.0–15.0)
Immature Granulocytes: 1 %
Lymphocytes Relative: 23 %
Lymphs Abs: 1.8 10*3/uL (ref 0.7–4.0)
MCH: 30.3 pg (ref 26.0–34.0)
MCHC: 32.1 g/dL (ref 30.0–36.0)
MCV: 94.5 fL (ref 80.0–100.0)
Monocytes Absolute: 0.9 10*3/uL (ref 0.1–1.0)
Monocytes Relative: 12 %
Neutro Abs: 4.8 10*3/uL (ref 1.7–7.7)
Neutrophils Relative %: 60 %
Platelet Count: 253 10*3/uL (ref 150–400)
RBC: 4.35 MIL/uL (ref 3.87–5.11)
RDW: 12.9 % (ref 11.5–15.5)
WBC Count: 8 10*3/uL (ref 4.0–10.5)
nRBC: 0 % (ref 0.0–0.2)

## 2023-03-19 LAB — CMP (CANCER CENTER ONLY)
ALT: 9 U/L (ref 0–44)
AST: 17 U/L (ref 15–41)
Albumin: 3.8 g/dL (ref 3.5–5.0)
Alkaline Phosphatase: 68 U/L (ref 38–126)
Anion gap: 7 (ref 5–15)
BUN: 15 mg/dL (ref 8–23)
CO2: 27 mmol/L (ref 22–32)
Calcium: 9.6 mg/dL (ref 8.9–10.3)
Chloride: 105 mmol/L (ref 98–111)
Creatinine: 0.92 mg/dL (ref 0.44–1.00)
GFR, Estimated: >60 mL/min (ref >60)
Glucose, Bld: 90 mg/dL (ref 70–99)
Potassium: 4.2 mmol/L (ref 3.5–5.1)
Sodium: 139 mmol/L (ref 135–145)
Total Bilirubin: 0.5 mg/dL (ref <1.2)
Total Protein: 7 g/dL (ref 6.5–8.1)

## 2023-03-19 LAB — LACTATE DEHYDROGENASE: LDH: 157 U/L (ref 98–192)

## 2023-03-19 NOTE — Progress Notes (Signed)
Hematology and Oncology Follow Up Visit  Lisa Paul [id number] Sep 29, 1934 88 y.o. 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  Prior Therapy:   Right lumpectomy on 12/27/2020  Current Therapy:  Femara 2.5 mg p.o. daily-start on 01/10/2021     Interim History:  Lisa Paul is back for follow-up.  She is here with her son.   She is currently living with her son in [place]. She is doing well.   They have no concerns today. They deny any unintentional weight loss, night sweats, intolerance of her letrozole or any breast concerns.  We are unsure of her last mammogram date but believe it was July 2024. She denies any breast changes or concerns. She is UTD on seeing her breast surgeon Lisa Paul.   She has had no change in bowel or bladder habits.  She has had no cough or shortness of breath.  She has had no nausea or vomiting.  There is been no bleeding.  She has had no rashes.  Currently, I would say performance status is probably ECOG 1.  Wt Readings from Last 3 Encounters:  03/19/23 245 lb 1.3 oz (111.2 kg)  08/24/22 242 lb (109.8 kg)  03/29/22 251 lb 4.8 oz (114 kg)     Medications:  Current Outpatient Medications:    acetaminophen (TYLENOL) 500 MG tablet, Take 2 tablets (1,000 mg total) by mouth every 8 (eight) hours as needed. (Patient taking differently: Take 500 mg by mouth every 8 (eight) hours as needed (for pain. (with tramadol)).), Disp: 30 tablet, Rfl: 0   Cyanocobalamin (B-12) 1000 MCG TABS, Take 1 tablet by mouth daily. (Patient taking differently: Take 1,000 mcg by mouth daily.), Disp: 30 tablet, Rfl:    diclofenac Sodium (VOLTAREN ARTHRITIS PAIN) 1 % GEL, Apply 4 g topically 4 (four) times daily., Disp: 350 g, Rfl: 6   diphenhydrAMINE (BENADRYL) 25 mg capsule, Take 1 capsule (25 mg total) by mouth every 6 (six) hours as needed. Equate brand - taking @@ night for runny eyes, Disp: 30  capsule, Rfl: 0   famotidine (PEPCID) 20 MG tablet, TAKE 1 TABLET BY MOUTH EVERY DAY IN THE MORNING, Disp: 30 tablet, Rfl: 2   fluticasone (FLONASE) 50 MCG/ACT nasal spray, SPRAY 2 SPRAYS INTO EACH NOSTRIL EVERY DAY, Disp: 48 mL, Rfl: 1   guaiFENesin (MUCINEX) 600 MG 12 hr tablet, Take 600 mg by mouth 2 (two) times daily. As needed for runny eyes, Disp: , Rfl:    letrozole (FEMARA) 2.5 MG tablet, TAKE 1 TABLET BY MOUTH EVERY DAY, Disp: 90 tablet, Rfl: 2   losartan-hydrochlorothiazide (HYZAAR) 100-25 MG tablet, TAKE 1 TABLET BY MOUTH EVERY DAY, Disp: 90 tablet, Rfl: 0   multivitamin (VIT W/EXTRA C) CHEW chewable tablet, Chew by mouth., Disp: , Rfl:    Polyethyl Glycol-Propyl Glycol 0.4-0.3 % SOLN, Place 1 drop into both eyes 2 (two) times daily as needed (for dry eyes.)., Disp: , Rfl:    amoxicillin (AMOXIL) 500 MG capsule, Take 4 tablets prior to dental procedures (Patient not taking: Reported on 03/19/2023), Disp: 8 capsule, Rfl: 1  Allergies:  Allergies  Allergen Reactions   Tape Other (See Comments)    Paper tape - blisters     Past Medical History, Surgical history, Social history, and Family History were reviewed and updated.  Review of Systems: Review of Systems  Constitutional: Negative.   HENT:  Negative.    Eyes: Negative.  Respiratory: Negative.    Cardiovascular: Negative.   Gastrointestinal: Negative.   Endocrine: Negative.   Genitourinary: Negative.    Musculoskeletal: Negative.   Skin: Negative.   Neurological: Negative.   Hematological: Negative.   Psychiatric/Behavioral: Negative.      Physical Exam:  height is 5\' 6"  (1.676 m) and weight is 245 lb 1.3 oz (111.2 kg). Her oral temperature is 98.4 F (36.9 C). Her blood pressure is 156/51 (abnormal) and her pulse is 73. Her respiration is 19 and oxygen saturation is 100%.   Wt Readings from Last 3 Encounters:  03/19/23 245 lb 1.3 oz (111.2 kg)  08/24/22 242 lb (109.8 kg)  03/29/22 251 lb 4.8 oz (114 kg)     Physical Exam Vitals reviewed.  Constitutional:      Comments: Breast exam deferred by pt  HENT:     Head: Normocephalic and atraumatic.  Eyes:     Pupils: Pupils are equal, round, and reactive to light.  Cardiovascular:     Rate and Rhythm: Normal rate and regular rhythm.     Heart sounds: Murmur (2/6 at RSB) heard.  Pulmonary:     Effort: Pulmonary effort is normal.     Breath sounds: Normal breath sounds.  Musculoskeletal:        General: No tenderness or deformity. Normal range of motion.     Cervical back: Normal range of motion.  Lymphadenopathy:     Cervical: No cervical adenopathy.  Skin:    General: Skin is warm and dry.     Findings: No erythema or rash.  Neurological:     Mental Status: She is alert and oriented to person, place, and time.  Psychiatric:        Behavior: Behavior normal.        Thought Content: Thought content normal.        Judgment: Judgment normal.      Lab Results  Component Value Date   WBC 8.0 03/19/2023   HGB 13.2 03/19/2023   HCT 41.1 03/19/2023   MCV 94.5 03/19/2023   PLT 253 03/19/2023     Chemistry      Component Value Date/Time   NA 139 03/19/2023 1139   NA 141 02/23/2021 1507   K 4.2 03/19/2023 1139   CL 105 03/19/2023 1139   CO2 27 03/19/2023 1139   BUN 15 03/19/2023 1139   BUN 15 02/23/2021 1507   CREATININE 0.92 03/19/2023 1139      Component Value Date/Time   CALCIUM 9.6 03/19/2023 1139   ALKPHOS 68 03/19/2023 1139   AST 17 03/19/2023 1139   ALT 9 03/19/2023 1139   BILITOT 0.5 03/19/2023 1139     Encounter Diagnosis  Name Primary?   Stage 1 breast cancer, ER+, right (HCC) Yes    Impression and Plan: Lisa Paul is a very charming 88 year old white female.  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  This probably was about 10 years ago.  She had a lumpectomy for this.  She did not recall receiving any adjuvant therapy.  She now has a new primary in the right breast.  She  underwent a lumpectomy.  This was a very small primary.  She did not need any radiation therapy.  She did not need any chemotherapy or anti-HER2 therapy.    Lisa Paul has her on Femara which she is tolerating well. Low chance of recurrence. It has been suggested that she be on Femara for  5 more years of therapy.   Today we reviewed that she will continue her Femara. Her heart murmur is mild. She is asymptotic. She has follow up with her PCP in about 2 weeks and they will discuss further there. Discussed red flags.   They will try to find out when her last mammogram was and send it to [place] as it appears to be external in nature. They will have her updated mammogram performed ASAP.   Disposition: RTC 6 months MD, labs (CBC, CMP)  Rushie Chestnut, PA-C 12/16/20241:07 PM

## 2023-04-03 ENCOUNTER — Encounter: Payer: Medicare (Managed Care) | Admitting: Family Medicine

## 2023-04-08 IMAGING — MG MM DIGITAL DIAGNOSTIC UNILAT*R* W/ TOMO W/ CAD
8 series · 8 of 20 positions shown · non-contrast
Comparison: Previous exam(s).

CLINICAL DATA: 86-year-old female presenting for follow-up of right
breast calcifications. The patient has history of left breast cancer
status post lumpectomy in 0380.

EXAM:
DIGITAL DIAGNOSTIC UNILATERAL RIGHT MAMMOGRAM WITH TOMOSYNTHESIS AND
CAD; ULTRASOUND RIGHT BREAST LIMITED
TECHNIQUE: Right digital diagnostic mammography and breast tomosynthesis was
performed. The images were evaluated with computer-aided detection.;
Targeted ultrasound examination of the right breast was performed

[R CC]
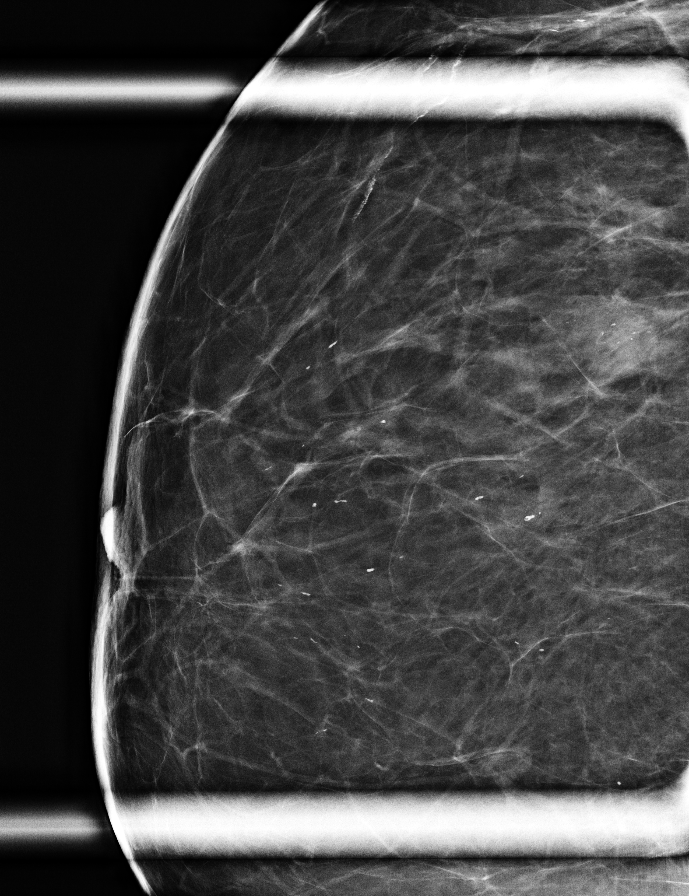

[R ML]
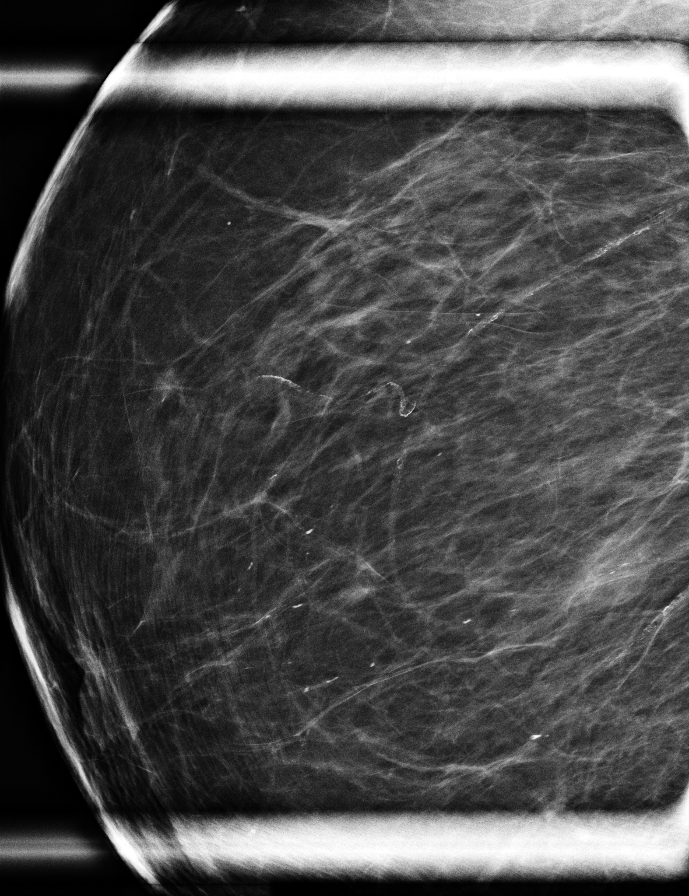

[R MLO synth-2D]
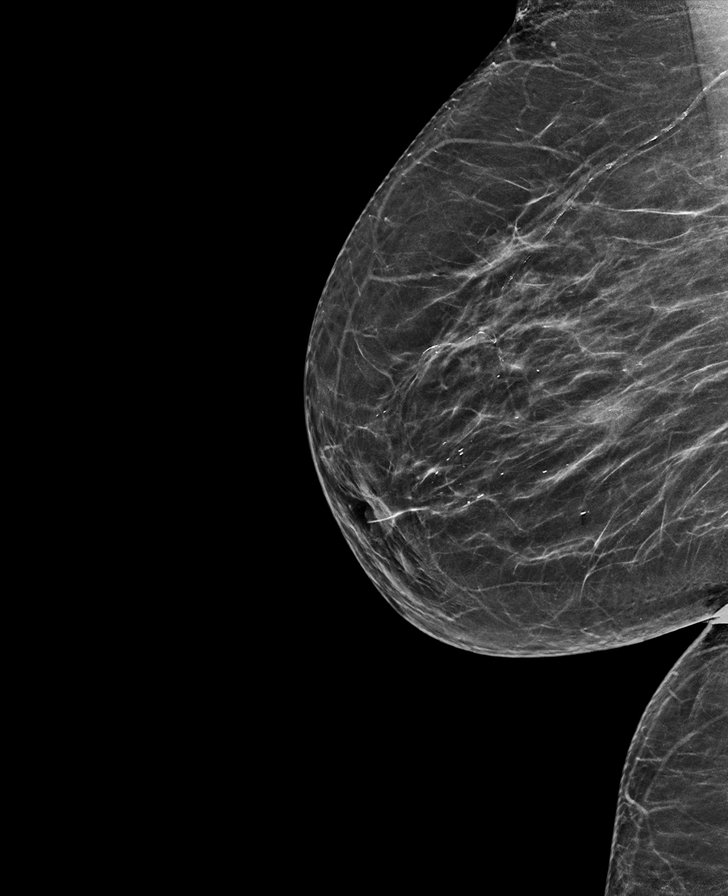

[R CC synth-2D (1 of 2)]
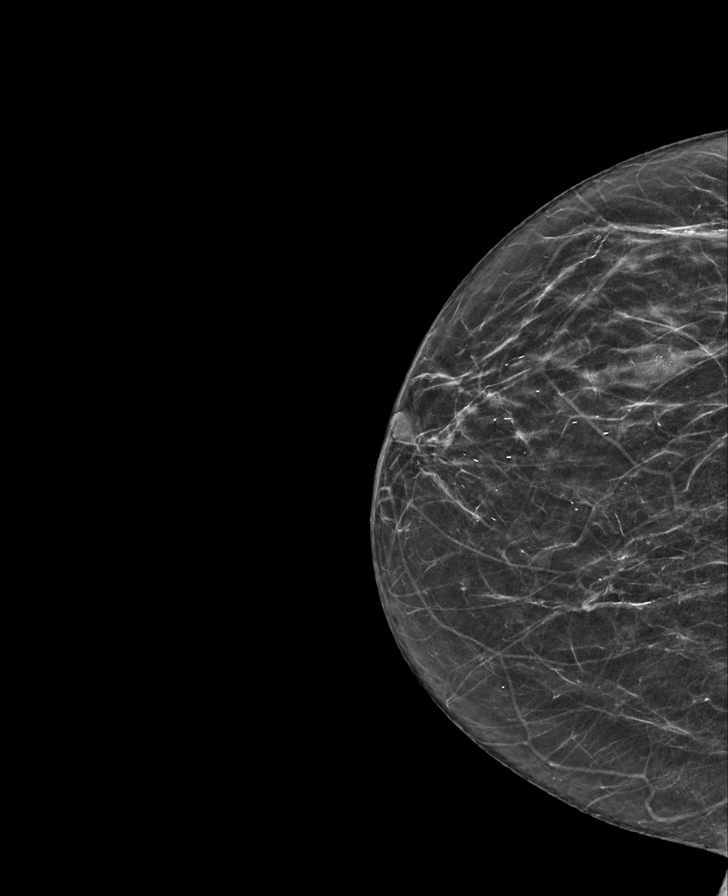

[R CC synth-2D (2 of 2)]
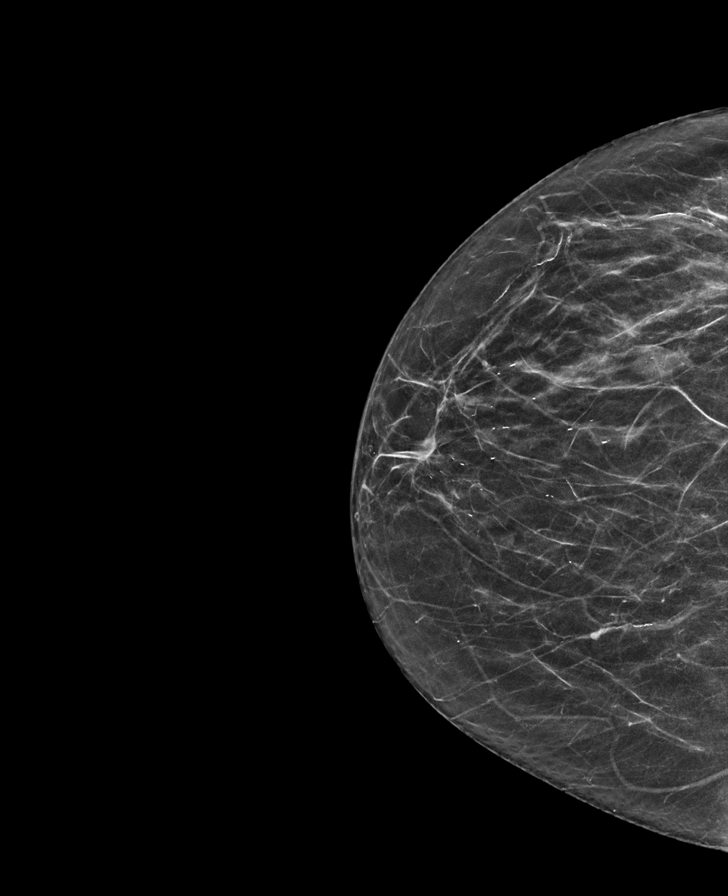

[R CC tomo (1 of 2) · tomo slice 26/51.0]
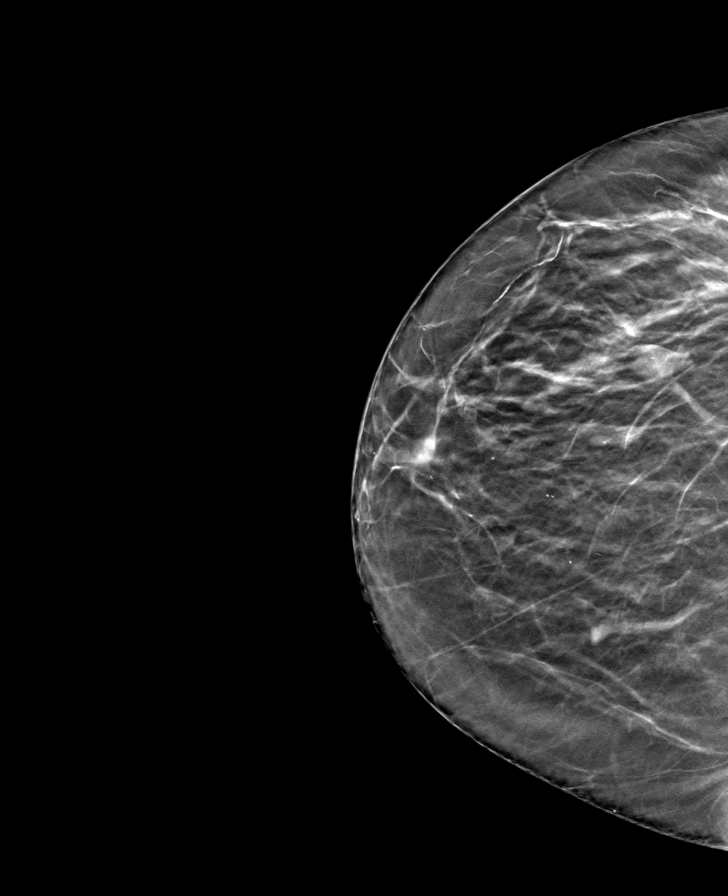

[R MLO tomo · tomo slice 31/60.0]
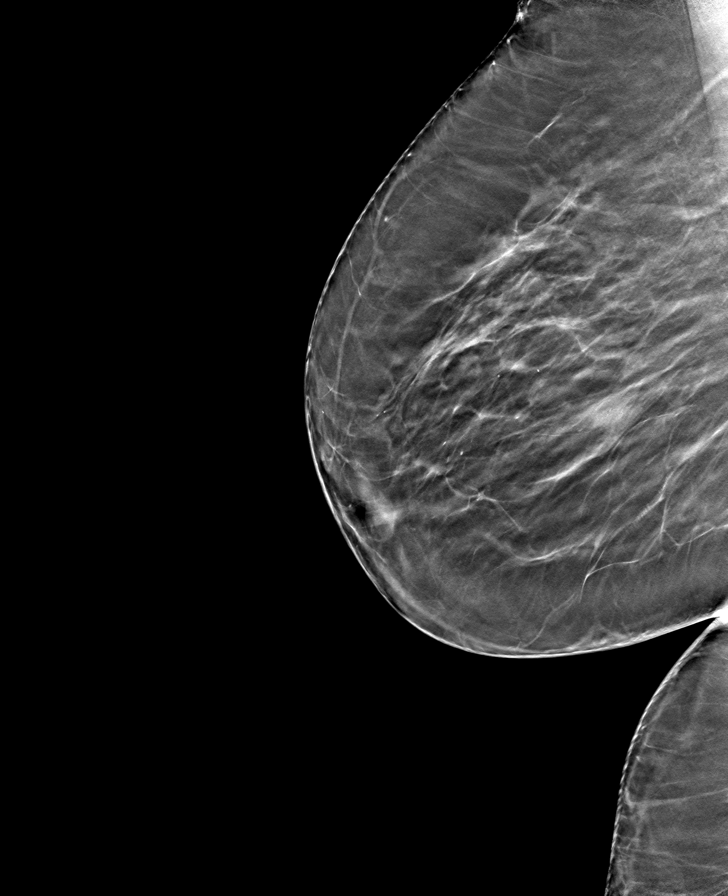

[R CC tomo (2 of 2) · tomo slice 27/53.0]
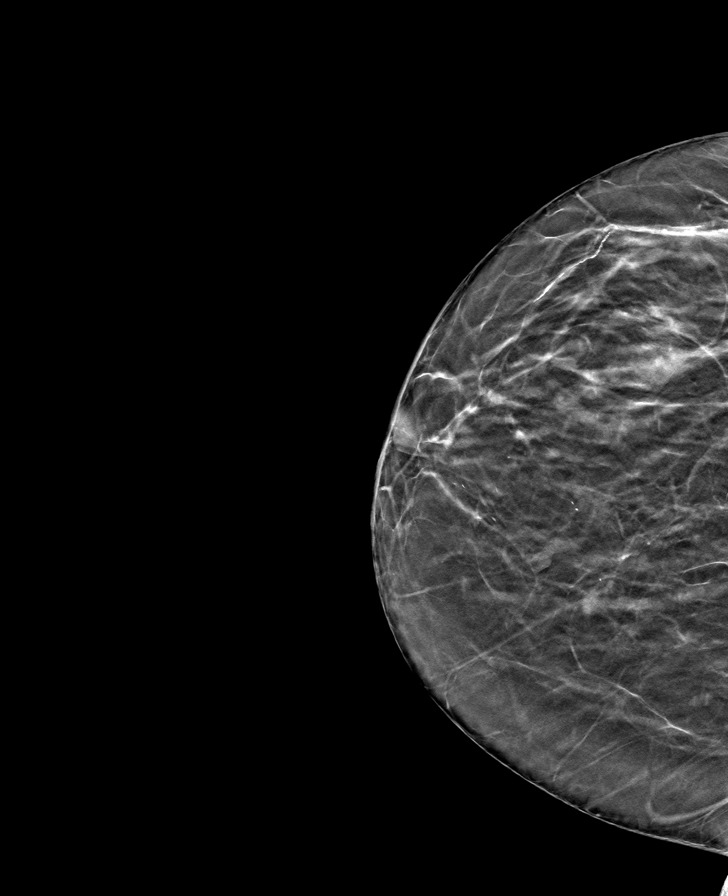

[8 of 20 positions shown; findings below may reference images not displayed]

ACR Breast Density Category b: There are scattered areas of
fibroglandular density.
FINDINGS: There is no significant interval change in the calcifications found
in the middle to posterior depth of the lateral right breast,
however there is increasing associated focal asymmetry.

Ultrasound targeted to the right breast at 9 o'clock, 3 cm from the
nipple demonstrates a mildly heterogeneous complex mass measuring
1.4 x 0.6 x 1.2 cm. Blood flow is seen within the mass on color
Doppler imaging. Ultrasound of the right axilla demonstrates
normal-appearing lymph nodes.
IMPRESSION: 1. There is an indeterminate 1.4 cm mass with associated
calcifications in the right breast at 9 o'clock.

2.  No evidence of right axillary lymphadenopathy.

RECOMMENDATION:
Ultrasound guided biopsy is recommended for the right breast mass.
We will contact the patient's physician for an order, and then call
the patient to schedule the procedure.

I have discussed the findings and recommendations with the patient.
If applicable, a reminder letter will be sent to the patient
regarding the next appointment.

BI-RADS CATEGORY  4: Suspicious.

## 2023-04-23 ENCOUNTER — Encounter: Payer: Medicare Other | Admitting: Family Medicine

## 2023-04-24 ENCOUNTER — Other Ambulatory Visit: Payer: Self-pay | Admitting: Family Medicine

## 2023-04-24 DIAGNOSIS — J309 Allergic rhinitis, unspecified: Secondary | ICD-10-CM

## 2023-04-24 MED ORDER — FLUTICASONE PROPIONATE 50 MCG/ACT NA SUSP: 2.0000 | Freq: Every day | NASAL | 0 refills | Status: DC

## 2023-04-24 NOTE — Telephone Encounter (Signed)
Medication Refill -  Most Recent Primary Care Visit:  Provider: Ronnald Ramp  Department: [facility]  Visit Type: PHYSICAL  Date: 03/29/2022  Medication: fluticasone (FLONASE) 50 MCG/ACT nasal spray   Has the patient contacted their pharmacy? Yes (Agent: If no, request that the patient contact the pharmacy for the refill. If patient does not wish to contact the pharmacy document the reason why and proceed with request.) (Agent: If yes, when and what did the pharmacy advise?)  Is this the correct pharmacy for this prescription? Yes If no, delete pharmacy and type the correct one.  This is the patient's preferred pharmacy:  [id number] - [place], [place] - [address] [facility] [address] [place] [address] Phone: [phone] Fax: [phone]   Has the prescription been filled recently? No  Is the patient out of the medication? Yes  Has the patient been seen for an appointment in the last year OR does the patient have an upcoming appointment? Yes  Can we respond through MyChart? Yes  Agent: Please be advised that Rx refills may take up to 3 business days. We ask that you follow-up with your pharmacy.

## 2023-04-24 NOTE — Telephone Encounter (Signed)
Requested Prescriptions  Pending Prescriptions Disp Refills   fluticasone (FLONASE) 50 MCG/ACT nasal spray 9.9 mL 0    Sig: Place 2 sprays into both nostrils daily.     Ear, Nose, and Throat: Nasal Preparations - Corticosteroids Failed - 04/24/2023  4:59 PM      Failed - Valid encounter within last 12 months    Recent Outpatient Visits           1 year ago Annual physical exam   [facility] Ronnald Ramp, MD   2 years ago Annual physical exam   [facility] Maple Hudson., MD   3 years ago Annual physical exam   [facility] Maple Hudson., MD   3 years ago Essential hypertension   [facility] Maple Hudson., MD   5 years ago Preoperative clearance   [facility] Maple Hudson., MD       Future Appointments             In 1 week Simmons-Robinson, Tawanna Cooler, MD [facility]

## 2023-05-01 ENCOUNTER — Telehealth: Payer: Self-pay | Admitting: Family Medicine

## 2023-05-01 ENCOUNTER — Other Ambulatory Visit: Payer: Self-pay | Admitting: Family Medicine

## 2023-05-01 ENCOUNTER — Other Ambulatory Visit: Payer: Self-pay | Admitting: *Deleted

## 2023-05-01 DIAGNOSIS — J309 Allergic rhinitis, unspecified: Secondary | ICD-10-CM

## 2023-05-01 MED ORDER — FLUTICASONE PROPIONATE 50 MCG/ACT NA SUSP: 2.0000 | Freq: Every day | NASAL | 0 refills | Status: AC

## 2023-05-01 NOTE — Telephone Encounter (Signed)
[facility] faxed refill request for the following medications: ? ?fluticasone (FLONASE) 50 MCG/ACT nasal spray  ? ?Please advise. ? ?

## 2023-05-01 NOTE — Telephone Encounter (Signed)
sent

## 2023-05-07 ENCOUNTER — Ambulatory Visit: Payer: Medicare Other | Admitting: Family Medicine

## 2023-05-07 VITALS — BP 111/75 | HR 159 | Resp 18 | Ht 66.0 in | Wt 238.9 lb

## 2023-05-07 DIAGNOSIS — E538 Deficiency of other specified B group vitamins: Secondary | ICD-10-CM

## 2023-05-07 DIAGNOSIS — E559 Vitamin D deficiency, unspecified: Secondary | ICD-10-CM | POA: Diagnosis not present

## 2023-05-07 DIAGNOSIS — I10 Essential (primary) hypertension: Secondary | ICD-10-CM | POA: Diagnosis not present

## 2023-05-07 DIAGNOSIS — Z0000 Encounter for general adult medical examination without abnormal findings: Secondary | ICD-10-CM

## 2023-05-07 DIAGNOSIS — Z23 Encounter for immunization: Secondary | ICD-10-CM

## 2023-05-07 DIAGNOSIS — Z0001 Encounter for general adult medical examination with abnormal findings: Secondary | ICD-10-CM

## 2023-05-07 DIAGNOSIS — K219 Gastro-esophageal reflux disease without esophagitis: Secondary | ICD-10-CM | POA: Diagnosis not present

## 2023-05-07 NOTE — Progress Notes (Signed)
Subjective:   Lisa Paul is a 89 y.o. female who presents for Medicare Annual (Subsequent) preventive examination.  Visit Complete: In person  Patient Medicare AWV questionnaire was completed by the patient on 05/07/23 ; I have confirmed that all information answered by patient is correct and no changes since this date.  Cardiac Risk Factors include: none     Objective:    Today's Vitals   05/07/23 1311  BP: 111/75  Pulse: (!) 159  Resp: 18  SpO2: 100%  Weight: 238 lb 14.4 oz (108.4 kg)  Height: 5\' 6"  (1.676 m)   Body mass index is 38.56 kg/m.     05/07/2023    1:16 PM 03/19/2023   11:52 AM 08/24/2022    1:11 PM 03/23/2022    3:26 PM 11/07/2021    1:36 PM 08/03/2021    9:46 AM 07/08/2021   10:15 AM  Advanced Directives  Does Patient Have a Medical Advance Directive? Yes Yes Yes Yes Yes Yes Yes  Type of Estate agent of [employer] Power of [place];Living will Healthcare Power of [place];Living will Healthcare Power of [place];Living will Healthcare Power of [place];Living will Healthcare Power of [place];Living will Healthcare Power of Attorney  Does patient want to make changes to medical advance directive? No - Patient declined No - Patient declined No - Patient declined No - Patient declined No - Patient declined Yes (Inpatient - patient defers changing a medical advance directive and declines information at this time) No - Patient declined  Copy of Healthcare Power of Attorney in Chart? No - copy requested No - copy requested No - copy requested No - copy requested No - copy requested Yes - validated most recent copy scanned in chart (See row information)   Would patient like information on creating a medical advance directive?       No - Patient declined    Current Medications (verified) Outpatient Encounter Medications as of 05/07/2023  Medication Sig   acetaminophen (TYLENOL) 500 MG tablet Take 2 tablets (1,000 mg total) by mouth  every 8 (eight) hours as needed. (Patient taking differently: Take 500 mg by mouth every 8 (eight) hours as needed (for pain. (with tramadol)).)   amoxicillin (AMOXIL) 500 MG capsule Take 4 tablets prior to dental procedures   Cyanocobalamin (B-12) 1000 MCG TABS Take 1 tablet by mouth daily. (Patient taking differently: Take 1,000 mcg by mouth daily.)   diclofenac Sodium (VOLTAREN ARTHRITIS PAIN) 1 % GEL Apply 4 g topically 4 (four) times daily.   diphenhydrAMINE (BENADRYL) 25 mg capsule Take 1 capsule (25 mg total) by mouth every 6 (six) hours as needed. Equate brand - taking @@ night for runny eyes   famotidine (PEPCID) 20 MG tablet TAKE 1 TABLET BY MOUTH EVERY DAY IN THE MORNING   fluticasone (FLONASE) 50 MCG/ACT nasal spray Place 2 sprays into both nostrils daily.   guaiFENesin (MUCINEX) 600 MG 12 hr tablet Take 600 mg by mouth 2 (two) times daily. As needed for runny eyes   letrozole (FEMARA) 2.5 MG tablet TAKE 1 TABLET BY MOUTH EVERY DAY   losartan-hydrochlorothiazide (HYZAAR) 100-25 MG tablet TAKE 1 TABLET BY MOUTH EVERY DAY   multivitamin (VIT W/EXTRA C) CHEW chewable tablet Chew by mouth.   Polyethyl Glycol-Propyl Glycol 0.4-0.3 % SOLN Place 1 drop into both eyes 2 (two) times daily as needed (for dry eyes.).   No facility-administered encounter medications on file as of 05/07/2023.    Allergies (verified) Tape  History: Past Medical History:  Diagnosis Date   Allergy    Anxiety    Arthritis 1993   Cancer (HCC) 2012   Left Breast with lumpectomy and mammosite   Cataract    Colon polyp 2005   Dyspnea    GERD (gastroesophageal reflux disease)    Goals of care, counseling/discussion 10/29/2020   Hypertension 15 years   Incontinence of urine    Lower extremity edema    Malignant neoplasm of breast (female), unspecified site    left breast   Neuropathy    Personal history of radiation therapy 2012   mammosite   Sleep apnea    Stage 1 breast cancer, ER+, right (HCC)  10/29/2020   Past Surgical History:  Procedure Laterality Date   ABDOMINAL HYSTERECTOMY  30 years ago   BREAST BIOPSY Left 2012   invasive mammary carcinoma   BREAST BIOPSY Right 10/19/2020   US biopsy/ path pending   BREAST LUMPECTOMY Left 2012   CARPAL TUNNEL RELEASE Bilateral    COLONOSCOPY  05/2003   [facility]   COLONOSCOPY  07/24/2012   EYE SURGERY Bilateral 2005   cataract   JOINT REPLACEMENT Bilateral 2007   knees   mammosite balloon placement Left 08/2010   Removal 09/2010   TONSILLECTOMY  age 77   TOTAL HIP ARTHROPLASTY Right 10/03/2017   Procedure: TOTAL HIP ARTHROPLASTY ANTERIOR APPROACH;  Surgeon: Lyndle Herrlich, MD;  Location: [facility] ORS;  Service: Orthopedics;  Laterality: Right;   Family History  Problem Relation Age of Onset   Colon cancer Mother    Cancer Mother    Stroke Brother        possibly. Pt is unsure.   Breast cancer Maternal Aunt    Prostate cancer Neg Hx    Bladder Cancer Neg Hx    Kidney cancer Neg Hx    Social History   Socioeconomic History   Marital status: Widowed    Spouse name: Not on file   Number of children: 2   Years of education: Not on file   Highest education level: Master's degree (e.g., MA, MS, MEng, MEd, MSW, MBA)  Occupational History   Occupation: retired  Tobacco Use   Smoking status: Never   Smokeless tobacco: Never  Vaping Use   Vaping status: Never Used  Substance and Sexual Activity   Alcohol use: No   Drug use: No   Sexual activity: Not Currently  Other Topics Concern   Not on file  Social History Narrative   Not on file   Social Drivers of Health   Financial Resource Strain: Low Risk  (05/07/2023)   Overall Financial Resource Strain (CARDIA)    Difficulty of Paying Living Expenses: Not hard at all  Food Insecurity: No Food Insecurity (05/07/2023)   Hunger Vital Sign    Worried About Running Out of Food in the Last Year: Never true    Ran Out of Food in the Last Year: Never true  Transportation  Needs: No Transportation Needs (05/07/2023)   PRAPARE - Administrator, Civil Service (Medical): No    Lack of Transportation (Non-Medical): No  Physical Activity: Unknown (05/07/2023)   Exercise Vital Sign    Days of Exercise per Week: 0 days    Minutes of Exercise per Session: Not on file  Stress: No Stress Concern Present (05/07/2023)   [employer] of Occupational Health - Occupational Stress Questionnaire    Feeling of Stress : Only a little  Social  Connections: Moderately Isolated (05/07/2023)   Social Connection and Isolation Panel [NHANES]    Frequency of Communication with Friends and Family: More than three times a week    Frequency of Social Gatherings with Friends and Family: More than three times a week    Attends Religious Services: 1 to 4 times per year    Active Member of [employer] or Organizations: No    Attends Banker Meetings: Not on file    Marital Status: Widowed    Tobacco Counseling Counseling given: Not Answered   Clinical Intake:  Pre-visit preparation completed: Yes  Pain : No/denies pain     Nutritional Status: BMI > 30  Obese Nutritional Risks: None Diabetes: No  How often do you need to have someone help you when you read instructions, pamphlets, or other written materials from your doctor or pharmacy?: 1 - Never  Interpreter Needed?: No      Activities of Daily Living    05/07/2023    1:13 PM  In your present state of health, do you have any difficulty performing the following activities:  Hearing? 1  Vision? 0  Difficulty concentrating or making decisions? 1  Walking or climbing stairs? 1  Dressing or bathing? 0  Doing errands, shopping? 1  Preparing Food and eating ? N  Using the Toilet? N  In the past six months, have you accidently leaked urine? Y  Do you have problems with loss of bowel control? N  Managing your Medications? N  Managing your Finances? N  Housekeeping or managing your Housekeeping? N     Patient Care Team: Ronnald Ramp, MD as PCP - General (Family Medicine) Dingeldein, Viviann Spare, MD as Consulting Physician (Ophthalmology) Geanie Logan, MD as Referring Physician (Otolaryngology) Deirdre Evener, MD as Consulting Physician (Dermatology) Josph Macho, MD as Consulting Physician (Oncology)  Indicate any recent Medical Services you may have received from other than [facility] providers in the past year (date may be approximate).     Assessment:   This is a routine wellness examination for [place].  Hearing/Vision screen No results found.   Goals Addressed   None   Depression Screen    05/07/2023    1:24 PM 08/03/2021    9:44 AM 02/23/2021    2:22 PM 12/31/2020    4:52 PM 12/11/2019   10:51 AM 08/11/2019   11:13 AM 06/09/2019   12:07 PM  PHQ 2/9 Scores  PHQ - 2 Score 1 1 0 0 2 0 3  PHQ- 9 Score 3  1  3  8     Fall Risk    05/07/2023    1:25 PM 05/07/2023    1:17 PM 08/03/2021    9:47 AM 02/23/2021    2:24 PM 12/31/2020    4:55 PM  Fall Risk   Falls in the past year? 1 1 0 0 1  Number falls in past yr: 1 1 0 0 0  Injury with Fall? 0 0 0 0 0  Risk for fall due to :   No Fall Risks No Fall Risks;Impaired balance/gait No Fall Risks  Follow up   Falls evaluation completed  Falls evaluation completed    MEDICARE RISK AT HOME: Medicare Risk at Home Any stairs in or around the home?: Yes If so, are there any without handrails?: No Home free of loose throw rugs in walkways, pet beds, electrical cords, etc?: Yes Adequate lighting in your home to reduce risk of falls?: Yes Life alert?: No Use  of a cane, walker or w/c?: Yes Grab bars in the bathroom?: No Shower chair or bench in shower?: Yes Elevated toilet seat or a handicapped toilet?: Yes  TIMED UP AND GO:  Was the test performed?  No    Cognitive Function:    12/11/2019   10:52 AM 07/30/2017    2:00 PM  MMSE - Mini Mental State Exam  Orientation to time 4 5  Orientation to Place 5 5   Registration 3 3  Attention/ Calculation 5 5  Recall 3 2  Language- name 2 objects 2 2  Language- repeat 1 1  Language- follow 3 step command 3 3  Language- read & follow direction 1 1  Write a sentence 1 1  Copy design 1 0  Total score 29 28        12/31/2020    4:57 PM 05/10/2016    1:37 PM  6CIT Screen  What Year? 0 points 0 points  What month? 0 points 0 points  What time? 0 points 0 points  Count back from 20 0 points 0 points  Months in reverse 0 points 0 points  Repeat phrase 0 points 6 points  Total Score 0 points 6 points    Immunizations Immunization History  Administered Date(s) Administered   Fluad Quad(high Dose 65+) 01/14/2019, 12/11/2019, 02/23/2021, 03/29/2022   Fluad Trivalent(High Dose 65+) 05/07/2023   Influenza, High Dose Seasonal PF 02/18/2015, 04/28/2016, 01/24/2017, 01/23/2018   Influenza-Unspecified 01/01/2013   PFIZER Comirnaty(Gray Top)Covid-19 Tri-Sucrose Vaccine 10/27/2020   PFIZER(Purple Top)SARS-COV-2 Vaccination 05/15/2019, 03/12/2020   Pfizer Covid-19 Vaccine Bivalent Booster 66yrs & up 05/26/2021   Pneumococcal Conjugate-13 05/07/2014   Pneumococcal Polysaccharide-23 02/15/1998, 07/11/2011   Td 03/12/2003   Td (Adult),5 Lf Tetanus Toxid, Preservative Free 03/12/2003   Tdap 05/07/2014   Zoster, Live 06/08/2007    TDAP status: Up to date  Flu Vaccine status: Completed at today's visit  Pneumococcal vaccine status: Up to date  Covid-19 vaccine status: Declined, Education has been provided regarding the importance of this vaccine but patient still declined. Advised may receive this vaccine at local pharmacy or Health Dept.or vaccine clinic. Aware to provide a copy of the vaccination record if obtained from local pharmacy or Health Dept. Verbalized acceptance and understanding.  Qualifies for Shingles Vaccine? Yes   Zostavax completed Yes   Shingrix Completed?: No.    Education has been provided regarding the importance of this vaccine.  Patient has been advised to call insurance company to determine out of pocket expense if they have not yet received this vaccine. Advised may also receive vaccine at local pharmacy or Health Dept. Verbalized acceptance and understanding.  Screening Tests Health Maintenance  Topic Date Due   Zoster Vaccines- Shingrix (1 of 2) 09/29/1953   COVID-19 Vaccine (5 - 2024-25 season) 12/03/2022   Medicare Annual Wellness (AWV)  05/06/2024   DTaP/Tdap/Td (4 - Td or Tdap) 05/07/2024   Pneumonia Vaccine 29+ Years old  Completed   INFLUENZA VACCINE  Completed   DEXA SCAN  Completed   HPV VACCINES  Aged Out    Health Maintenance  Health Maintenance Due  Topic Date Due   Zoster Vaccines- Shingrix (1 of 2) 09/29/1953   COVID-19 Vaccine (5 - 2024-25 season) 12/03/2022    Colorectal cancer screening: No longer required.   Mammogram status: Completed 2022, currently undergoing oncological care . Repeat every year  Additional Screening:   Dental Screening: Recommended annual dental exams for proper oral hygiene  Physical Exam Vitals  reviewed.  Constitutional:      General: She is not in acute distress.    Appearance: Normal appearance. She is not ill-appearing, toxic-appearing or diaphoretic.  Eyes:     Conjunctiva/sclera: Conjunctivae normal.  Cardiovascular:     Rate and Rhythm: Normal rate and regular rhythm.     Pulses: Normal pulses.     Heart sounds: Normal heart sounds. No murmur heard.    No friction rub. No gallop.  Pulmonary:     Effort: Pulmonary effort is normal. No respiratory distress.     Breath sounds: Normal breath sounds. No stridor. No wheezing, rhonchi or rales.  Abdominal:     General: Bowel sounds are normal. There is no distension.     Palpations: Abdomen is soft.     Tenderness: There is no abdominal tenderness.  Musculoskeletal:     Right lower leg: Edema present.     Left lower leg: Edema present.  Skin:    Findings: No erythema or rash.  Neurological:      Mental Status: She is alert and oriented to person, place, and time.  Psychiatric:        Mood and Affect: Mood and affect normal.        Speech: Speech normal.        Behavior: Behavior normal. Behavior is cooperative.         Plan:     I have personally reviewed and noted the following in the patient's chart:   Medical and social history Use of alcohol, tobacco or illicit drugs  Functional ability and status Nutritional status Physical activity Advanced directives List of other physicians Hospitalizations, surgeries, and ER visits in previous 12 months Vitals Screenings to include cognitive, depression, and falls Referrals and appointments  In addition, I have reviewed and discussed with patient certain preventive protocols, quality metrics, and best practice recommendations. A written personalized care plan for preventive services as well as general preventive health recommendations were provided to patient.     Ronnald Ramp, MD   05/07/2023   After Visit Summary: (In Person-Printed) AVS printed and given to the patient

## 2023-05-07 NOTE — Patient Instructions (Addendum)
It was a pleasure to see you today!  Thank you for choosing [facility] for your primary care.     To keep you healthy, please keep in mind the following health maintenance items that you are due for:   Shigrix vaccine  COVID   Best Wishes,   Dr. Roxan Hockey

## 2023-05-08 ENCOUNTER — Encounter: Payer: Self-pay | Admitting: Family Medicine

## 2023-05-08 ENCOUNTER — Other Ambulatory Visit: Payer: Self-pay | Admitting: Family Medicine

## 2023-05-08 LAB — VITAMIN B12: Vitamin B-12: 549 pg/mL (ref 232–1245)

## 2023-05-08 LAB — TSH+T4F+T3FREE
Free T4: 1.55 ng/dL (ref 0.82–1.77)
T3, Free: 3.2 pg/mL (ref 2.0–4.4)
TSH: 1.72 u[IU]/mL (ref 0.450–4.500)

## 2023-05-08 LAB — VITAMIN D 25 HYDROXY (VIT D DEFICIENCY, FRACTURES): Vit D, 25-Hydroxy: 40.6 ng/mL (ref 30.0–100.0)

## 2023-05-08 NOTE — Telephone Encounter (Signed)
 Requested medication (s) are due for refill today: Yes  Requested medication (s) are on the active medication list: Yes  Last refill:  04/03/22  Future visit scheduled: Yes  Notes to clinic:  Unable to refill due to no refill protocol for this medication.      Requested Prescriptions  Pending Prescriptions Disp Refills   letrozole  (FEMARA ) 2.5 MG tablet 90 tablet 2    Sig: Take 1 tablet (2.5 mg total) by mouth daily.     Off-Protocol Failed - 05/08/2023 12:37 PM      Failed - Medication not assigned to a protocol, review manually.      Failed - Valid encounter within last 12 months    Recent Outpatient Visits           1 year ago Annual physical exam   [facility] Sharma Coyer, MD   2 years ago Annual physical exam   [facility] [place] Charlie LITTIE Mickey., MD   3 years ago Annual physical exam   [facility] Charlie LITTIE Mickey., MD   3 years ago Essential hypertension   [facility] Charlie LITTIE Mickey., MD   5 years ago Preoperative clearance   [facility] Charlie LITTIE Mickey., MD       Future Appointments             In 1 year Simmons-Robinson, Coyer, MD [facility]

## 2023-05-08 NOTE — Telephone Encounter (Signed)
 Medication Refill -  Most Recent Primary Care Visit:  Provider: SIMMONS-ROBINSON, MAKIERA  Department: [id number]  Visit Type: PHYSICAL   Caller states patient was seen yesterday by PCP and forgot to ask her to refill letrozole  (FEMARA ) 2.5 MG tablet. Caller would like request sent in today.   Has the patient contacted their pharmacy? No   Is this the correct pharmacy for this prescription? Yes  This is the patient's preferred pharmacy:  [id number] [place], [place] - [address] [facility] [address] [place] [address] Phone: [phone] Fax: [phone]   Has the prescription been filled recently? Yes  Is the patient out of the medication? Yes  Has the patient been seen for an appointment in the last year OR does the patient have an upcoming appointment? Yes  Can we respond through MyChart? No  Agent: Please be advised that Rx refills may take up to 3 business days. We ask that you follow-up with your pharmacy.

## 2023-05-09 ENCOUNTER — Other Ambulatory Visit: Payer: Self-pay

## 2023-05-09 MED ORDER — LETROZOLE 2.5 MG PO TABS: 2.5000 mg | ORAL_TABLET | Freq: Every day | ORAL | 2 refills | Status: AC

## 2023-05-11 ENCOUNTER — Telehealth: Payer: Self-pay

## 2023-05-11 ENCOUNTER — Other Ambulatory Visit: Payer: Self-pay | Admitting: Family Medicine

## 2023-05-11 ENCOUNTER — Telehealth: Payer: Self-pay | Admitting: Family Medicine

## 2023-05-11 DIAGNOSIS — I10 Essential (primary) hypertension: Secondary | ICD-10-CM

## 2023-05-11 MED ORDER — LOSARTAN POTASSIUM-HCTZ 100-25 MG PO TABS: 1.0000 | ORAL_TABLET | Freq: Every day | ORAL | 0 refills | Status: DC

## 2023-05-11 NOTE — Telephone Encounter (Signed)
 Pt was seen in office by Dr. Lang 05/07/2023. Requested Prescriptions  Pending Prescriptions Disp Refills   losartan -hydrochlorothiazide  (HYZAAR) 100-25 MG tablet 90 tablet 0    Sig: Take 1 tablet by mouth daily.     Cardiovascular: ARB + Diuretic Combos Failed - 05/11/2023  5:22 PM      Failed - Valid encounter within last 6 months    Recent Outpatient Visits           1 year ago Annual physical exam   [facility] [place], [place], MD   2 years ago Annual physical exam   [facility] Charlie LITTIE Mickey., MD   3 years ago Annual physical exam   [facility] Charlie LITTIE Mickey., MD   3 years ago Essential hypertension   [facility] Charlie LITTIE Mickey., MD   5 years ago Preoperative clearance   [facility] Charlie LITTIE Mickey., MD       Future Appointments             In 1 year Simmons-Robinson, [place], MD [facility]            Passed - K in normal range and within 180 days    Potassium  Date Value Ref Range Status  03/19/2023 4.2 3.5 - 5.1 mmol/L Final         Passed - Na in normal range and within 180 days    Sodium  Date Value Ref Range Status  03/19/2023 139 135 - 145 mmol/L Final  02/23/2021 141 134 - 144 mmol/L Final         Passed - Cr in normal range and within 180 days    Creatinine  Date Value Ref Range Status  03/19/2023 0.92 0.44 - 1.00 mg/dL Final         Passed - eGFR is 10 or above and within 180 days    GFR calc Af Amer  Date Value Ref Range Status  12/11/2019 55 (L) >59 mL/min/1.73 Final    Comment:    **[facility] currently reports eGFR in compliance with the current**   recommendations of the [employer]. [facility] will   update reporting as new guidelines are published from the NKF-ASN   Task force.    GFR, Estimated  Date Value Ref Range  Status  03/19/2023 >60 >60 mL/min Final    Comment:    (NOTE) Calculated using the CKD-EPI Creatinine Equation (2021)    eGFR  Date Value Ref Range Status  02/23/2021 54 (L) >59 mL/min/1.73 Final         Passed - Patient is not pregnant      Passed - Last BP in normal range    BP Readings from Last 1 Encounters:  05/07/23 111/75

## 2023-05-11 NOTE — Telephone Encounter (Signed)
 Pt is calling in checking on the status of her refill request for losartan -hydrochlorothiazide  (HYZAAR) 100-25 MG tablet [[id number]] , pt says she is now out of the medication and was told at her last appointment it would be refilled. Please advise.

## 2023-05-11 NOTE — Telephone Encounter (Signed)
 Refill sent in today in a separate refill encounter.

## 2023-05-11 NOTE — Telephone Encounter (Signed)
 Medication Refill -  Most Recent Primary Care Visit:  Provider: SIMMONS-ROBINSON, MAKIERA  Department: [id number]  Visit Type: PHYSICAL  Date: 03/29/2022  Medication: losartan -hydrochlorothiazide  (HYZAAR) 100-25 MG tablet   Has the patient contacted their pharmacy? Yes  (Agent: If yes, when and what did the pharmacy advise?) Contact Office   Is this the correct pharmacy for this prescription? Yes  This is the patient's preferred pharmacy:  [id number] [place], [place] - [address] [facility] [address] [place] [address] Phone: [phone] Fax: [phone]   Has the prescription been filled recently? Yes  Is the patient out of the medication? Yes  Has the patient been seen for an appointment in the last year OR does the patient have an upcoming appointment? No  Can we respond through MyChart? Yes  Agent: Please be advised that Rx refills may take up to 3 business days. We ask that you follow-up with your pharmacy.

## 2023-05-11 NOTE — Telephone Encounter (Signed)
 Pt was calling in to expedite refill of BP medications. I was able to refill per protocol.losartan  hydrochlorothiazide  100-25

## 2023-08-06 ENCOUNTER — Other Ambulatory Visit: Payer: Self-pay | Admitting: Family Medicine

## 2023-08-06 DIAGNOSIS — I10 Essential (primary) hypertension: Secondary | ICD-10-CM

## 2023-09-14 ENCOUNTER — Other Ambulatory Visit: Payer: Self-pay | Admitting: *Deleted

## 2023-09-14 DIAGNOSIS — Z853 Personal history of malignant neoplasm of breast: Secondary | ICD-10-CM

## 2023-09-14 DIAGNOSIS — C50911 Malignant neoplasm of unspecified site of right female breast: Secondary | ICD-10-CM

## 2023-09-17 ENCOUNTER — Inpatient Hospital Stay: Payer: Medicare (Managed Care) | Admitting: Hematology & Oncology

## 2023-09-17 ENCOUNTER — Inpatient Hospital Stay: Payer: Medicare (Managed Care) | Attending: Family Medicine

## 2023-10-02 ENCOUNTER — Telehealth: Payer: Self-pay

## 2023-10-02 NOTE — Telephone Encounter (Signed)
 Reviewed

## 2023-10-02 NOTE — Telephone Encounter (Signed)
 FYI   Copied from CRM [id number]. Topic: Clinical - Medication Question >> Oct 02, 2023 11:16 AM [place] MATSU wrote: Reason for CRM: [employer] Nurse Bernice called in to advise no medication reconciliation was completed due to no medication list available.

## 2023-10-12 ENCOUNTER — Telehealth: Payer: Self-pay

## 2023-10-12 NOTE — Telephone Encounter (Signed)
 Copied from CRM [id number]. Topic: Clinical - Home Health Verbal Orders >> Oct 12, 2023 12:34 PM Wess RAMAN wrote: Caller/Agency: Jillian/ [facility] Callback Number: [phone] Service Requested: wound care and medication education and fall preventation education Frequency: 1x 8weeks Any new concerns about the patient? No

## 2023-10-12 NOTE — Telephone Encounter (Signed)
 Called but was only able to leave a message informing the her per Dr R she has giving a verbal ok for the request we would like an hard copy for documenting purpose.

## 2023-10-12 NOTE — Telephone Encounter (Signed)
Ok for verbal orders.    Andreya Lacks Simmons-Robinson, MD  [facility]  

## 2023-11-21 ENCOUNTER — Telehealth: Payer: Self-pay

## 2023-11-21 NOTE — Telephone Encounter (Signed)
 Copied from CRM #[id number]. Topic: Clinical - Home Health Verbal Orders >> Nov 21, 2023  1:39 PM [name] RAMAN wrote: Caller/Agency: Keddy with [facility] Callback Number: [phone] Service Requested: Physical Therapy Frequency: 1 x a week for 2 weeks and then reassessment Any new concerns about the patient? Yes she has lower extremity weakness due to edema and she wants to continue therapy. Please leave a message if necessary as voice mail is secure.  Please assist patient further. [name] also states the patient as moved to the [facility] in [place] and to update her address and phone number on file which I did.

## 2023-11-23 NOTE — Telephone Encounter (Signed)
Ok for verbal orders.    Lisa Lacks Simmons-Robinson, MD  [facility]  

## 2023-11-26 NOTE — Telephone Encounter (Signed)
 Advised

## 2023-12-13 ENCOUNTER — Telehealth: Payer: Self-pay

## 2023-12-13 NOTE — Transitions of Care (Post Inpatient/ED Visit) (Signed)
   12/13/2023  Name: CHARRISSE MASLEY MRN: [id number] DOB: 1935-03-25  Today's TOC FU Call Status: Today's TOC FU Call Status:: Unsuccessful Call (1st Attempt) Unsuccessful Call (1st Attempt) Date: 12/13/23  Attempted to reach the patient regarding the most recent Inpatient/ED visit. Left a HIPAA approved voicemail message to phone number provided in demographics for preferred phone number. Follow Up Plan: Additional outreach attempts will be made to reach the patient to complete the Transitions of Care (Post Inpatient/ED visit) call.   Richerd Fish, RN, BSN, CCM [facility], [facility] RN Care Manager Direct Dial: [phone]

## 2023-12-14 ENCOUNTER — Telehealth: Payer: Self-pay

## 2023-12-14 NOTE — Telephone Encounter (Signed)
Ok for verbal orders.    Andreya Lacks Simmons-Robinson, MD  [facility]  

## 2023-12-14 NOTE — Telephone Encounter (Signed)
 Copied from CRM [id number]. Topic: Clinical - Home Health Verbal Orders >> Dec 14, 2023  2:19 PM Carlatta H wrote: Caller/Agency: Grayce Rushing Number: [phone] Service Requested: Skilled Nursing Frequency: 3 times a week for 3 weeks Any new concerns about the patient? No

## 2023-12-14 NOTE — Telephone Encounter (Signed)
 Called and advised Lisa Paul of approval.

## 2023-12-19 ENCOUNTER — Telehealth: Payer: Self-pay

## 2023-12-19 NOTE — Telephone Encounter (Signed)
 Copied from CRM [id number]. Topic: Clinical - Home Health Verbal Orders >> Dec 19, 2023  3:24 PM DeAngela L wrote: Caller/Agency: Ms. [facility]  Callback Number: [phone] secured line  Service Requested: Physical Therapy Frequency: 1w4  Any new concerns about the patient? No

## 2023-12-19 NOTE — Telephone Encounter (Signed)
Ok for verbal orders.    Andreya Lacks Simmons-Robinson, MD  [facility]  

## 2023-12-19 NOTE — Telephone Encounter (Signed)
 Spoke with Ms. Mack and advised of approval

## 2023-12-21 ENCOUNTER — Ambulatory Visit: Payer: Self-pay

## 2023-12-21 NOTE — Telephone Encounter (Signed)
 Patient call note and symptoms reviewed. Agree with ED urgent evaluation

## 2023-12-21 NOTE — Telephone Encounter (Signed)
 FYI Only or Action Required?: FYI only for provider.  Patient was last seen in primary care on 05/07/2023 by Sharma Coyer, MD.  Called Nurse Triage reporting Extremity Weakness.  Symptoms began today.  Interventions attempted: Nothing.  Symptoms are: gradually worsening.  Triage Disposition: Call EMS 911 Now  Patient/caregiver understands and will follow disposition?: UnsureCopied from CRM #[id number]. Topic: Clinical - Red Word Triage >> Dec 21, 2023 12:22 PM Terri G wrote: Red Word that prompted transfer to Nurse Triage: Patient is extremely fatigue and glossy eyeballs. Reason for Disposition  Sounds like a life-threatening emergency to the triager  Answer Assessment - Initial Assessment Questions Robin, RN, with [facility] called to report decline in pt. Grayce treats numerous wounds on legs. She stated the drainage is overwhelming and performs wound care 3x/week. Drainage is yellow/green.  Each visit pt is worse. Bottom of feet are mottled. It is very sleepy and uses accessory muscles while sleeping. BP was 100/60, O2 94%. Pt is responsive when Grayce speaks to hear but pt kept falling back asleep. RN advised to call 911. Grayce has to call son first and is afraid he will deny it. Grayce stated she will call back if he refuses. RN attempted to notify [facility] of situation but no answer  Protocols used: Weakness (Generalized) and Fatigue-A-AH

## 2023-12-26 ENCOUNTER — Telehealth: Payer: Self-pay

## 2023-12-26 NOTE — Transitions of Care (Post Inpatient/ED Visit) (Signed)
   12/26/2023  Name: Lisa Paul MRN: [id number] DOB: [birth year]  Today's TOC FU Call Status: Today's TOC FU Call Status:: Unsuccessful Call (1st Attempt) Unsuccessful Call (1st Attempt) Date: 12/26/23  Attempted to reach the patient regarding the most recent Inpatient/ED visit. Left a HIPAA approved voicemail message to phone number provided in demographics per DPR.    Follow Up Plan: Additional outreach attempts will be made to reach the patient to complete the Transitions of Care (Post Inpatient/ED visit) call.   Richerd Fish, RN, BSN, CCM [facility], [facility] RN Care Manager Direct Dial: [phone]

## 2024-03-03 DIAGNOSIS — F32A Depression, unspecified: Secondary | ICD-10-CM

## 2024-03-03 DIAGNOSIS — I130 Hypertensive heart and chronic kidney disease with heart failure and stage 1 through stage 4 chronic kidney disease, or unspecified chronic kidney disease: Secondary | ICD-10-CM

## 2024-03-03 DIAGNOSIS — F0393 Unspecified dementia, unspecified severity, with mood disturbance: Secondary | ICD-10-CM

## 2024-03-03 DIAGNOSIS — G4722 Circadian rhythm sleep disorder, advanced sleep phase type: Secondary | ICD-10-CM

## 2024-03-03 DIAGNOSIS — Z6838 Body mass index (BMI) 38.0-38.9, adult: Secondary | ICD-10-CM

## 2024-03-03 DIAGNOSIS — L89322 Pressure ulcer of left buttock, stage 2: Secondary | ICD-10-CM

## 2024-03-03 DIAGNOSIS — N1831 Chronic kidney disease, stage 3a: Secondary | ICD-10-CM

## 2024-03-03 DIAGNOSIS — H9193 Unspecified hearing loss, bilateral: Secondary | ICD-10-CM

## 2024-03-03 DIAGNOSIS — I5022 Chronic systolic (congestive) heart failure: Secondary | ICD-10-CM

## 2024-03-03 DIAGNOSIS — I4892 Unspecified atrial flutter: Secondary | ICD-10-CM

## 2024-03-03 DIAGNOSIS — I4819 Other persistent atrial fibrillation: Secondary | ICD-10-CM

## 2024-05-12 ENCOUNTER — Encounter: Payer: Medicare Other | Admitting: Family Medicine
